# Patient Record
Sex: Female | Born: 1960 | Race: White | Hispanic: No | Marital: Married | Smoking: Never smoker
Health system: Southern US, Community
[De-identification: ages and names within clinical notes are randomized; demographics above are authoritative.]

## PROBLEM LIST (undated history)

## (undated) DIAGNOSIS — Z9889 Other specified postprocedural states: Secondary | ICD-10-CM

## (undated) DIAGNOSIS — I868 Varicose veins of other specified sites: Secondary | ICD-10-CM

## (undated) DIAGNOSIS — R112 Nausea with vomiting, unspecified: Secondary | ICD-10-CM

## (undated) DIAGNOSIS — R51 Headache: Secondary | ICD-10-CM

## (undated) DIAGNOSIS — K589 Irritable bowel syndrome without diarrhea: Secondary | ICD-10-CM

## (undated) DIAGNOSIS — I10 Essential (primary) hypertension: Secondary | ICD-10-CM

## (undated) DIAGNOSIS — S42309A Unspecified fracture of shaft of humerus, unspecified arm, initial encounter for closed fracture: Secondary | ICD-10-CM

## (undated) DIAGNOSIS — Z0282 Encounter for adoption services: Secondary | ICD-10-CM

## (undated) DIAGNOSIS — Z789 Other specified health status: Secondary | ICD-10-CM

## (undated) DIAGNOSIS — G35 Multiple sclerosis: Secondary | ICD-10-CM

## (undated) HISTORY — DX: Headache: R51

## (undated) HISTORY — PX: BREAST MASS EXCISION: SHX1267

## (undated) HISTORY — DX: Encounter for adoption services: Z02.82

## (undated) HISTORY — DX: Irritable bowel syndrome, unspecified: K58.9

## (undated) HISTORY — PX: ABDOMINAL HYSTERECTOMY: SHX81

## (undated) HISTORY — DX: Other specified health status: Z78.9

## (undated) HISTORY — DX: Varicose veins of other specified sites: I86.8

---

## 2003-01-24 ENCOUNTER — Other Ambulatory Visit: Admission: RE | Admit: 2003-01-24 | Discharge: 2003-01-24 | Payer: Self-pay | Admitting: Obstetrics and Gynecology

## 2003-07-30 ENCOUNTER — Encounter: Admission: RE | Admit: 2003-07-30 | Discharge: 2003-07-30 | Payer: Self-pay | Admitting: Obstetrics and Gynecology

## 2003-08-07 ENCOUNTER — Encounter: Admission: RE | Admit: 2003-08-07 | Discharge: 2003-08-07 | Payer: Self-pay | Admitting: Obstetrics and Gynecology

## 2003-08-07 ENCOUNTER — Encounter (INDEPENDENT_AMBULATORY_CARE_PROVIDER_SITE_OTHER): Payer: Self-pay | Admitting: Specialist

## 2004-03-09 ENCOUNTER — Other Ambulatory Visit: Admission: RE | Admit: 2004-03-09 | Discharge: 2004-03-09 | Payer: Self-pay | Admitting: Obstetrics and Gynecology

## 2004-05-13 ENCOUNTER — Encounter: Admission: RE | Admit: 2004-05-13 | Discharge: 2004-05-13 | Payer: Self-pay | Admitting: Obstetrics and Gynecology

## 2004-07-28 ENCOUNTER — Ambulatory Visit (HOSPITAL_COMMUNITY): Admission: RE | Admit: 2004-07-28 | Discharge: 2004-07-28 | Payer: Self-pay | Admitting: General Surgery

## 2004-07-28 ENCOUNTER — Ambulatory Visit (HOSPITAL_BASED_OUTPATIENT_CLINIC_OR_DEPARTMENT_OTHER): Admission: RE | Admit: 2004-07-28 | Discharge: 2004-07-28 | Payer: Self-pay | Admitting: General Surgery

## 2004-07-28 ENCOUNTER — Encounter (INDEPENDENT_AMBULATORY_CARE_PROVIDER_SITE_OTHER): Payer: Self-pay | Admitting: *Deleted

## 2005-12-07 ENCOUNTER — Other Ambulatory Visit: Admission: RE | Admit: 2005-12-07 | Discharge: 2005-12-07 | Payer: Self-pay | Admitting: Obstetrics and Gynecology

## 2008-02-23 ENCOUNTER — Emergency Department (HOSPITAL_COMMUNITY): Admission: EM | Admit: 2008-02-23 | Discharge: 2008-02-23 | Payer: Self-pay | Admitting: Family Medicine

## 2010-08-02 ENCOUNTER — Emergency Department (HOSPITAL_COMMUNITY): Admission: EM | Admit: 2010-08-02 | Discharge: 2010-08-02 | Payer: Self-pay | Admitting: Family Medicine

## 2010-11-03 ENCOUNTER — Encounter (INDEPENDENT_AMBULATORY_CARE_PROVIDER_SITE_OTHER): Payer: BC Managed Care – PPO | Admitting: Vascular Surgery

## 2010-11-03 DIAGNOSIS — R221 Localized swelling, mass and lump, neck: Secondary | ICD-10-CM

## 2010-11-04 NOTE — Consult Note (Signed)
NEW PATIENT CONSULTATION  Cynthia Nichols, Cynthia Nichols DOB:  Nov 25, 1960                                       11/03/2010 [id number]#:[id number]  Patient presents today for evaluation of right neck mass.  She is a very pleasant, healthy 50 year old white female who reported that since at least her 20's, she had prominent fullness in the supraclavicular area and her right neck.  Over the past month, this has become firm.  She has had various imaging studies to include a CT angiogram at [facility] on 10/16/2010.  She had an old MRI of her brain from [facility] from a year ago for comparison.  She does have a history of MS.  She does have a history of asthma.  PRIOR HISTORY:  Hysterectomy, bilateral breast nodules removed, and wisdom tooth extraction.  SOCIAL HISTORY:  She is married with 3 children.  She works as an Char[employer] fundraiser. She does not smoke or drink alcohol.  FAMILY HISTORY:  Unknown.  She was adopted at birth.  REVIEW OF SYSTEMS:  No weight loss or gain.  She weighs 130 pounds.  She is 5 feet 7 inches tall. CARDIAC:  Positive for shortness of breath, exertion, and chest tightness. GI:  Diarrhea and constipation. NEUROLOGIC:  Headaches. PULMONARY:  Asthma. URINARY:  Frequent urination.  Otherwise, review of systems is negative.  PHYSICAL EXAMINATION:  A well-developed and well-nourished white female appearing stated age in no acute stress.  Blood pressure 130/85, pulse 83, respirations 18.  HEENT:  Does show a very firm 3 cm mass at the base of her right neck above her clavicle.  She also has an easily palpable cord in her external jugular vein, and this is clinically thrombosed.  She does not have any similar findings in her left neck. Her radial pulses are 2+.  Chest is clear bilaterally.  Heart is regular rate and rhythm.  I do not hear any carotid bruits.  Abdomen is soft and nontender.  Her neurologic shows no focal weakness or paresthesias. Skin without ulcers or  rashes.  I did review her CT from [facility].  This clearly shows that this is a thrombosis of a large venous structure.  She does have flow at that time into her external jugular vein extending into this.  She clinically has subsequently occluded her external jugular vein.  I had a long discussion with patient.  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  This has thrombosed, and she is now clinically feeling this as the distention in this area.  I explained this should have essentially 0 risk for emboli and should resolve spontaneously.  I explained that as her thrombus is absorbed, that she may resolve this venous aneurysm or she may reconstitute flow into this.  I explained that she may have persistent fullness in this area, and if so, would recommend a resection of this area.  I would not recommend anything until she has had probable spontaneous resolution of this.  She understands and will notify us should she have any clinical changes, otherwise we will see her again in 1 month for continued follow-up.    Larina Earthly, M.D.  TFE/MEDQ  D:  11/03/2010  T:  11/04/2010  Job:  [id number]  cc:   Juluis Mire, M.D.  Laren Everts, [facility]

## 2010-12-01 LAB — POCT I-STAT, CHEM 8
BUN: 14 mg/dL (ref 6–23)
Calcium, Ion: 1.24 mmol/L (ref 1.12–1.32)
Chloride: 109 mEq/L (ref 96–112)
Creatinine, Ser: 0.9 mg/dL (ref 0.4–1.2)
Glucose, Bld: 97 mg/dL (ref 70–99)
HCT: 43 % (ref 36.0–46.0)
Hemoglobin: 14.6 g/dL (ref 12.0–15.0)
Potassium: 4.1 mEq/L (ref 3.5–5.1)
Sodium: 142 mEq/L (ref 135–145)
TCO2: 25 mmol/L (ref 0–100)

## 2011-02-02 ENCOUNTER — Ambulatory Visit: Payer: Self-pay | Admitting: Vascular Surgery

## 2011-02-05 NOTE — Op Note (Signed)
NAMEJAMYLAH, MARINACCIO NO.:  [id number]   MEDICAL RECORD NO.:  [id number]          PATIENT TYPE:  AMB   LOCATION:  DSC                          FACILITY:  [facility]   PHYSICIAN:  Rose Phi. Maple Hudson, M.D.   DATE OF BIRTH:  August 27, 1961   DATE OF PROCEDURE:  07/28/2004  DATE OF DISCHARGE:                                 OPERATIVE REPORT   PREOPERATIVE DIAGNOSIS:  Fibroadenoma of the right breast.   POSTOPERATIVE DIAGNOSIS:  Fibroadenoma of the right breast.   OPERATION:  Excision of fibroadenoma of the right breast.   SURGEON:  Rose Phi. Maple Hudson, M.D.   ANESTHESIA:  MAC.   OPERATIVE PROCEDURE:  The patient is placed on the operating table with the  arms extended on the arm board.  The right breast is prepped and draped in  the usual fashion.   The palpable nodule was at the 9 o'clock position close to the areolar  margin, and it was outlined with a marking pencil.  A curvilinear incision  was then outlined and the area infiltrated with the local anesthetic  mixture.  Incision was made and the fibroadenoma exposed.  A suture was  placed in it for traction purposes, and it was elevated and then excised.  Grossly it appeared to be a fibroadenoma.   Hemostasis obtained with electrocautery.   A subcuticular closure of 4-0 Monocryl and Steri-Strips carried out.  Dressing applied.  The patient transferred to the recovery room in  satisfactory condition, having tolerated the procedure well.      Pete   PRY/MEDQ  D:  07/28/2004  T:  07/28/2004  Job:  [id number]

## 2011-02-23 ENCOUNTER — Ambulatory Visit: Payer: Self-pay | Admitting: Vascular Surgery

## 2011-03-23 ENCOUNTER — Ambulatory Visit (INDEPENDENT_AMBULATORY_CARE_PROVIDER_SITE_OTHER): Payer: BC Managed Care – PPO | Admitting: Vascular Surgery

## 2011-03-23 DIAGNOSIS — I868 Varicose veins of other specified sites: Secondary | ICD-10-CM

## 2011-03-23 NOTE — Assessment & Plan Note (Signed)
OFFICE VISIT  Cynthia Nichols, Cynthia Nichols DOB:  [birth year]                                       03/23/2011 [id number]#:[id number]  The patient presents today for followup of right neck venous aneurysm. I had seen her 3 months ago at which time she had apparently thrombosed a longstanding venous aneurysm at the base of her right neck.  At that time I recommended conservative treatment and explained the probable resolution of this.  Fortunately she has had no further difficulty.  She does have a softening of the area of thrombosed venous aneurysm.  It appears clinically as though she does have flow in her external jugular currently with resolution of the thrombus in this as well.  She does report some point tenderness over her clavicle and I explained that this may or may not be related to the venous thrombus but certainly is in the same area.  She does report that she is having some swelling in her lower extremities but certainly does not have any currently and does not have any evidence of venous varicosities or other venous pathology.  She does wear compression garments and this does help.  I suspect this is related to fluid overload rather than a venous pathology and certainly no correctable venous pathology.  I have recommended continued observation only.  She was reassured with the discussion and will see [place] again on an as-needed basis.    Larina Earthly, M.D. Electronically Signed  TFE/MEDQ  D:  03/23/2011  T:  03/23/2011  Job:  [id number]  cc:   Laren Everts, DO Juluis Mire, M.D.

## 2011-06-17 LAB — POCT RAPID STREP A: Streptococcus, Group A Screen (Direct): NEGATIVE

## 2011-10-22 ENCOUNTER — Encounter: Payer: Self-pay | Admitting: Vascular Surgery

## 2011-10-25 ENCOUNTER — Encounter: Payer: Self-pay | Admitting: Vascular Surgery

## 2011-10-26 ENCOUNTER — Encounter: Payer: Self-pay | Admitting: Vascular Surgery

## 2011-10-26 ENCOUNTER — Ambulatory Visit (INDEPENDENT_AMBULATORY_CARE_PROVIDER_SITE_OTHER): Payer: BC Managed Care – PPO | Admitting: Vascular Surgery

## 2011-10-26 VITALS — BP 138/83 | HR 98 | Resp 18 | Ht 67.0 in | Wt 132.0 lb

## 2011-10-26 DIAGNOSIS — I720 Aneurysm of carotid artery: Secondary | ICD-10-CM

## 2011-10-26 DIAGNOSIS — M79606 Pain in leg, unspecified: Secondary | ICD-10-CM

## 2011-10-26 DIAGNOSIS — M79609 Pain in unspecified limb: Secondary | ICD-10-CM

## 2011-10-26 HISTORY — DX: Aneurysm of carotid artery: I72.0

## 2011-10-26 NOTE — Progress Notes (Signed)
The patient presents today for evaluation of lower surety discomfort. She has pain and burning in both feet and does have some swelling from her knees distally with prolonged standing. Reports this has been progressive despite the use of Neurontin and pain medication. She also has a discoloration of both feet.  It seen the patient in the past for evaluation of venous aneurysm in her right supraclavicular area. She did unchanged thrombosis and is minimal recurrence and size of this.  Past Medical History  Diagnosis Date  . IBS (irritable bowel syndrome)   . Asthma   . Adopted     Patient has no known family history  . Headache   . Venous aneurysm     Right side of neck    History  Substance Use Topics  . Smoking status: Never Smoker   . Smokeless tobacco: Never Used  . Alcohol Use: Yes    No family history on file.  No Known Allergies  Current outpatient prescriptions:ALPRAZolam (XANAX) 0.25 MG tablet, Take 0.25 mg by mouth at bedtime as needed., Disp: , Rfl: ;  baclofen (LIORESAL) 10 MG tablet, Take 10 mg by mouth as needed., Disp: , Rfl: ;  Benzonatate 150 MG CAPS, Take by mouth. ZONEGRAN, Disp: , Rfl: ;  budesonide-formoterol (SYMBICORT) 160-4.5 MCG/ACT inhaler, Inhale 2 puffs into the lungs 2 (two) times daily., Disp: , Rfl:  butalbital-acetaminophen-caffeine (FIORICET, ESGIC) 50-325-40 MG per tablet, Take 1 tablet by mouth 2 (two) times daily as needed., Disp: , Rfl: ;  cholecalciferol (VITAMIN D) 1000 UNITS tablet, Take 2,000 Units by mouth daily., Disp: , Rfl: ;  Cyanocobalamin (VITAMIN B 12 PO), Take by mouth., Disp: , Rfl: ;  gabapentin (NEURONTIN) 300 MG capsule, Take 300 mg by mouth. PATIENT TAKING 900MG  AT BEDTIME, Disp: , Rfl:  levocetirizine (XYZAL) 5 MG tablet, Take 5 mg by mouth as needed. , Disp: , Rfl: ;  omeprazole (PRILOSEC OTC) 20 MG tablet, Take 20 mg by mouth daily., Disp: , Rfl: ;  traMADol (ULTRAM) 50 MG tablet, Take 50 mg by mouth every 6 (six) hours as needed.,  Disp: , Rfl:   BP 138/83  Pulse 98  Resp 18  Ht 5\' 7"  (1.702 m)  Wt 132 lb (59.875 kg)  BMI 20.67 kg/m2  Body mass index is 20.67 kg/(m^2).       Review of systems: Suffer a chest pressure and shortness of breath with lying flat and asthma  Neurologic weakness in arms and legs and dizziness  Review of systems otherwise negative  Physical exam: Well-developed well-nourished white female no acute distress she has 2+ radial and dorsalis pedis pulses bilaterally abdomen is soft nontender no masses noted. She does have diffuse petechiae over both feet and extending in a more scattered area above her ankles up onto her distal cath. She does have some very mild swelling in her lower extremity. She does not have any evidence of venous varicosities.  Impression and plan: Unusual pattern with burning and stinging pain in her feet and skin changes of petechiae and hemosiderin deposit in her distal ankles onto her feet. He does have some swelling and I recommended a venous reflux study to rule out significant reflux. This does not appear to be the case only on the physical exam. We will obtain a duplex at her convenience and discuss this further at that time I explained that with normal arterial flow I do not think this is a significant vascular problem

## 2011-11-08 ENCOUNTER — Encounter: Payer: Self-pay | Admitting: Vascular Surgery

## 2011-11-09 ENCOUNTER — Encounter: Payer: Self-pay | Admitting: Vascular Surgery

## 2011-11-09 ENCOUNTER — Ambulatory Visit (INDEPENDENT_AMBULATORY_CARE_PROVIDER_SITE_OTHER): Payer: BC Managed Care – PPO | Admitting: Vascular Surgery

## 2011-11-09 ENCOUNTER — Encounter (INDEPENDENT_AMBULATORY_CARE_PROVIDER_SITE_OTHER): Payer: BC Managed Care – PPO | Admitting: *Deleted

## 2011-11-09 VITALS — BP 120/87 | HR 104 | Resp 18 | Ht 67.0 in | Wt 136.0 lb

## 2011-11-09 DIAGNOSIS — M7989 Other specified soft tissue disorders: Secondary | ICD-10-CM

## 2011-11-09 DIAGNOSIS — I872 Venous insufficiency (chronic) (peripheral): Secondary | ICD-10-CM | POA: Insufficient documentation

## 2011-11-09 DIAGNOSIS — M79609 Pain in unspecified limb: Secondary | ICD-10-CM

## 2011-11-09 HISTORY — DX: Venous insufficiency (chronic) (peripheral): I87.2

## 2011-11-09 NOTE — Progress Notes (Signed)
The patient is here today for venous duplex for followup in the burning and stinging and swelling discomfort in her lower extremities. She also had bilateral petechia. She did not appear to have venous reflux on the basis of physical exam when I seen her in [place] February. I had recommended duplex to rule out any significant deep venous reflux.  Past Medical History  Diagnosis Date  . IBS (irritable bowel syndrome)   . Asthma   . Adopted     Patient has no known family history  . Headache   . Venous aneurysm     Right side of neck    History  Substance Use Topics  . Smoking status: Never Smoker   . Smokeless tobacco: Never Used  . Alcohol Use: Yes    History reviewed. No pertinent family history.  No Known Allergies  Current outpatient prescriptions:ALPRAZolam (XANAX) 0.25 MG tablet, Take 0.25 mg by mouth at bedtime as needed., Disp: , Rfl: ;  baclofen (LIORESAL) 10 MG tablet, Take 10 mg by mouth as needed., Disp: , Rfl: ;  Benzonatate 150 MG CAPS, Take by mouth. ZONEGRAN, Disp: , Rfl: ;  budesonide-formoterol (SYMBICORT) 160-4.5 MCG/ACT inhaler, Inhale 2 puffs into the lungs 2 (two) times daily., Disp: , Rfl:  butalbital-acetaminophen-caffeine (FIORICET, ESGIC) 50-325-40 MG per tablet, Take 1 tablet by mouth 2 (two) times daily as needed., Disp: , Rfl: ;  cholecalciferol (VITAMIN D) 1000 UNITS tablet, Take 15,000 Units by mouth daily. , Disp: , Rfl: ;  Cyanocobalamin (VITAMIN B 12 PO), Take by mouth., Disp: , Rfl: ;  gabapentin (NEURONTIN) 300 MG capsule, Take 300 mg by mouth. PATIENT TAKING 900MG  AT BEDTIME, Disp: , Rfl:  levocetirizine (XYZAL) 5 MG tablet, Take 5 mg by mouth as needed. , Disp: , Rfl: ;  omeprazole (PRILOSEC OTC) 20 MG tablet, Take 20 mg by mouth daily., Disp: , Rfl: ;  traMADol (ULTRAM) 50 MG tablet, Take 50 mg by mouth every 6 (six) hours as needed., Disp: , Rfl:   BP 120/87  Pulse 104  Resp 18  Ht 5\' 7"  (1.702 m)  Wt 136 lb (61.689 kg)  BMI 21.30 kg/m2  SpO2  100%  Body mass index is 21.30 kg/(m^2).       Physical exam well-developed well-nourished white female no acute distress. The area of her prior right neck venous aneurysm is stable with no evidence of recurrence. She has no change in her lower extremity is with no significant swelling in a scattered petechia from her mid calf distally.  Venous duplex. She is no evidence of reflux in her greater small saphenous veins bilaterally and no evidence of enlarged saphenous saphenous veins. He does have mild reflux in her right superficial femoral vein.  Impression and plan no evidence of significant venous hypertension and no evidence of arterial insufficiency in her lower Shoney's bilaterally. I discussed this at length with the patient. She will continue with neurologic evaluation to determine the cause of her discomfort. She will see [place] on an as-needed basis

## 2011-11-11 NOTE — Procedures (Unsigned)
LOWER EXTREMITY VENOUS REFLUX EXAM  INDICATION:  Bilateral lower extremity pain.  EXAM:  Using color-flow imaging and pulse Doppler spectral analysis, the bilateral common femoral, superficial femoral, popliteal, posterior tibial, greater and lesser saphenous veins are evaluated.  There is evidence suggesting focal deep venous insufficiency in the right superficial femoral vein.  The bilateral saphenofemoral junctions are competent. The bilateral GSV is competent.  The bilateral proximal small saphenous vein is too small to evaluate for reflux.  GSV Diameter (used if found to be incompetent only)                                           Right    Left Proximal Greater Saphenous Vein           cm       cm Proximal-to-mid-thigh                     cm       cm Mid thigh                                 cm       cm Mid-distal thigh                          cm       cm Distal thigh                              cm       cm Knee                                      cm       cm  IMPRESSION: 1. Bilateral greater saphenous veins are competent. 2. There is focal reflux observed in the right superficial femoral     vein. 3. No evidence of deep venous thrombosis is observed.  ___________________________________________ Larina Earthly, M.D.  LT/MEDQ  D:  11/09/2011  T:  11/09/2011  Job:  [id number]

## 2011-12-14 DIAGNOSIS — Z9109 Other allergy status, other than to drugs and biological substances: Secondary | ICD-10-CM | POA: Insufficient documentation

## 2011-12-14 DIAGNOSIS — G35 Multiple sclerosis: Secondary | ICD-10-CM | POA: Insufficient documentation

## 2011-12-14 DIAGNOSIS — G44219 Episodic tension-type headache, not intractable: Secondary | ICD-10-CM | POA: Insufficient documentation

## 2011-12-14 HISTORY — DX: Multiple sclerosis: G35

## 2012-06-15 ENCOUNTER — Encounter: Payer: Self-pay | Admitting: Cardiovascular Disease

## 2012-06-16 ENCOUNTER — Encounter: Payer: Self-pay | Admitting: Cardiovascular Disease

## 2012-06-21 DIAGNOSIS — K589 Irritable bowel syndrome without diarrhea: Secondary | ICD-10-CM | POA: Insufficient documentation

## 2012-06-21 DIAGNOSIS — I868 Varicose veins of other specified sites: Secondary | ICD-10-CM | POA: Insufficient documentation

## 2012-06-23 ENCOUNTER — Encounter: Payer: Self-pay | Admitting: Cardiovascular Disease

## 2012-06-23 ENCOUNTER — Ambulatory Visit (INDEPENDENT_AMBULATORY_CARE_PROVIDER_SITE_OTHER): Payer: BC Managed Care – PPO | Admitting: Cardiovascular Disease

## 2012-06-23 VITALS — BP 136/88 | HR 88 | Ht 67.0 in | Wt 137.0 lb

## 2012-06-23 DIAGNOSIS — G35 Multiple sclerosis: Secondary | ICD-10-CM

## 2012-06-23 DIAGNOSIS — M79606 Pain in leg, unspecified: Secondary | ICD-10-CM

## 2012-06-23 DIAGNOSIS — R0602 Shortness of breath: Secondary | ICD-10-CM

## 2012-06-23 DIAGNOSIS — E782 Mixed hyperlipidemia: Secondary | ICD-10-CM

## 2012-06-23 DIAGNOSIS — G35D Multiple sclerosis, unspecified: Secondary | ICD-10-CM

## 2012-06-23 DIAGNOSIS — R002 Palpitations: Secondary | ICD-10-CM

## 2012-06-23 DIAGNOSIS — M79609 Pain in unspecified limb: Secondary | ICD-10-CM

## 2012-06-23 MED ORDER — PROPRANOLOL HCL 10 MG PO TABS: 10.0000 mg | ORAL_TABLET | ORAL | Status: DC | PRN

## 2012-06-23 NOTE — Patient Instructions (Addendum)
Your physician has requested that you have a stress echocardiogram. For further information please visit [email or web address]. Please follow instruction sheet as given. Your physician recommends that you continue on your current medications as directed. Please refer to the Current Medication list given to you today.    

## 2012-06-23 NOTE — Progress Notes (Signed)
Patient ID: Cynthia Nichols, female   DOB: 1961-04-09, 51 y.o.   MRN: [id number] 51 yo referred by Dr Cynthia Nichols for palpitations and dyspnea  Palpitations worse last few weeks.  More at night and not necessarily with exercise  Can be bad enough to cause dyspnea.  No documented SVT or PAF on monitor Denies acute stress or anxiety but work at [employer] always challenging  No chest pain.  Non smoker with no lung disease No LE edema. Had leg pain in 2/13 and had some venous insuf. On left with no DVT   Thyroid borderline low and mild elevation in cholesterol  On diet Rx  Has MS and has had [place] injections of copazone daily.  She has had this for two years though  MS stable with MRI/MRA coming up  Reviewed monitor 9/27 SR with PAC;s Average HR 75 minimum 55  Maximum 122  PAC;s less than 1% of beats  No ectopies longer than 3 beats   ROS: Denies fever, malais, weight loss, blurry vision, decreased visual acuity, cough, sputum, SOB, hemoptysis, pleuritic pain, palpitaitons, heartburn, abdominal pain, melena, lower extremity edema, claudication, or rash.  All other systems reviewed and negative   General: Affect appropriate Healthy:  appears stated age HEENT: normal Neck supple with no adenopathy JVP normal no bruits no thyromegaly Lungs clear with no wheezing and good diaphragmatic motion Heart:  S1/S2 no murmur,rub, gallop or click PMI normal Abdomen: benighn, BS positve, no tenderness, no AAA no bruit.  No HSM or HJR Distal pulses intact with no bruits No edema Neuro non-focal Skin warm and dry No muscular weakness  Medications Current Outpatient Prescriptions  Medication Sig Dispense Refill  . ALPRAZolam (XANAX) 0.25 MG tablet Take 0.25 mg by mouth at bedtime as needed.      . baclofen (LIORESAL) 10 MG tablet Take 10 mg by mouth as needed.      . budesonide-formoterol (SYMBICORT) 160-4.5 MCG/ACT inhaler Inhale 2 puffs into the lungs 2 (two) times daily.      . butalbital-acetaminophen-caffeine  (FIORICET, ESGIC) 50-325-40 MG per tablet Take 1 tablet by mouth 2 (two) times daily as needed.      . cholecalciferol (VITAMIN D) 1000 UNITS tablet Take 15,000 Units by mouth daily.       . Cyanocobalamin (VITAMIN B 12 PO) Take by mouth.      . gabapentin (NEURONTIN) 300 MG capsule Take 600 mg by mouth. PATIENT TAKING 900MG  AT BEDTIME      . levocetirizine (XYZAL) 5 MG tablet Take 5 mg by mouth as needed.       . pantoprazole (PROTONIX) 40 MG tablet       . traMADol (ULTRAM) 50 MG tablet Take 50 mg by mouth every 6 (six) hours as needed.      Marland Kitchen PREMARIN vaginal cream         Allergies Imitrex  Family History: No family history on file.  Social History: History   Social History  . Marital Status: Married    Spouse Name: N/A    Number of Children: N/A  . Years of Education: N/A   Occupational History  . Not on file.   Social History Main Topics  . Smoking status: Never Smoker   . Smokeless tobacco: Never Used  . Alcohol Use: Yes  . Drug Use: No  . Sexually Active: Not on file   Other Topics Concern  . Not on file   Social History Narrative  . No narrative on file  Electrocardiogram:  NSR rate 78  Normal ECG  Assessment and Plan

## 2012-06-23 NOTE — Assessment & Plan Note (Signed)
Cholesterol is at goal.  Continue current dose of statin and diet Rx.  No myalgias or side effects.  F/U  LFT's in 6 months. No results found for this basename: LDLCALC   LDL 120 on labs 9/26  Diet Rx

## 2012-06-23 NOTE — Assessment & Plan Note (Signed)
F/U MRI/MRA with neuro Continue capazone.  Should not be related to palpitations

## 2012-06-23 NOTE — Assessment & Plan Note (Signed)
Bening sounding  Monitor just PAC;s  PRN inderal  Stress echo to R/O structural heart disease and make sure no PAF/PSVT with stress

## 2012-06-23 NOTE — Assessment & Plan Note (Signed)
Mild venous insuf.  Support hose as needed especially long shifts at work

## 2012-07-04 ENCOUNTER — Ambulatory Visit (HOSPITAL_COMMUNITY): Payer: BC Managed Care – PPO | Attending: Cardiovascular Disease | Admitting: Radiology

## 2012-07-04 ENCOUNTER — Ambulatory Visit (HOSPITAL_COMMUNITY): Payer: BC Managed Care – PPO | Attending: Cardiovascular Disease

## 2012-07-04 ENCOUNTER — Encounter: Payer: Self-pay | Admitting: Cardiovascular Disease

## 2012-07-04 DIAGNOSIS — R002 Palpitations: Secondary | ICD-10-CM | POA: Insufficient documentation

## 2012-07-04 DIAGNOSIS — G35 Multiple sclerosis: Secondary | ICD-10-CM | POA: Insufficient documentation

## 2012-07-04 DIAGNOSIS — R0602 Shortness of breath: Secondary | ICD-10-CM

## 2012-07-04 DIAGNOSIS — R0989 Other specified symptoms and signs involving the circulatory and respiratory systems: Secondary | ICD-10-CM

## 2012-07-04 DIAGNOSIS — J45909 Unspecified asthma, uncomplicated: Secondary | ICD-10-CM | POA: Insufficient documentation

## 2012-07-04 DIAGNOSIS — R0609 Other forms of dyspnea: Secondary | ICD-10-CM | POA: Insufficient documentation

## 2012-07-04 NOTE — Progress Notes (Signed)
Stress Echocardiogram performed.  

## 2012-08-07 ENCOUNTER — Encounter: Payer: Self-pay | Admitting: Cardiovascular Disease

## 2012-08-07 NOTE — Progress Notes (Signed)
Patient ID: Cynthia Nichols, female   DOB: Mar 02, 1961, 51 y.o.   MRN: [id number] 51 yo referred by Dr Arelia Sneddon for palpitations and dyspnea Palpitations worse last few weeks. More at night and not necessarily with exercise Can be bad enough to cause dyspnea. No documented SVT or PAF on monitor Denies acute stress or anxiety but work at [employer] always challenging No chest pain. Non smoker with no lung disease No LE edema. Had leg pain in 2/13 and had some venous insuf. On left with no DVT Thyroid borderline low and mild elevation in cholesterol On diet Rx Has MS and has had [place] injections of copazone daily. She has had this for two years though MS stable with MRI/MRA coming up  Reviewed monitor 9/27 SR with PAC;s Average HR 75 minimum 55 Maximum 122 PAC;s less than 1% of beats No ectopies longer than 3 beats  ROS: Denies fever, malais, weight loss, blurry vision, decreased visual acuity, cough, sputum, SOB, hemoptysis, pleuritic pain, palpitaitons, heartburn, abdominal pain, melena, lower extremity edema, claudication, or rash.  All other systems reviewed and negative  General: Affect appropriate Healthy:  appears stated age HEENT: normal Neck supple with no adenopathy JVP normal no bruits no thyromegaly Lungs clear with no wheezing and good diaphragmatic motion Heart:  S1/S2 no murmur, no rub, gallop or click PMI normal Abdomen: benighn, BS positve, no tenderness, no AAA no bruit.  No HSM or HJR Distal pulses intact with no bruits No edema Neuro non-focal Skin warm and dry No muscular weakness   Current Outpatient Prescriptions  Medication Sig Dispense Refill  . ALPRAZolam (XANAX) 0.25 MG tablet Take 0.25 mg by mouth at bedtime as needed.      . baclofen (LIORESAL) 10 MG tablet Take 10 mg by mouth as needed.      . budesonide-formoterol (SYMBICORT) 160-4.5 MCG/ACT inhaler Inhale 2 puffs into the lungs 2 (two) times daily.      . butalbital-acetaminophen-caffeine (FIORICET, ESGIC) 50-325-40 MG  per tablet Take 1 tablet by mouth 2 (two) times daily as needed.      . cholecalciferol (VITAMIN D) 1000 UNITS tablet Take 15,000 Units by mouth daily.       . Cyanocobalamin (VITAMIN B 12 PO) Take by mouth.      . gabapentin (NEURONTIN) 300 MG capsule Take 600 mg by mouth. PATIENT TAKING 900MG  AT BEDTIME      . levocetirizine (XYZAL) 5 MG tablet Take 5 mg by mouth as needed.       . pantoprazole (PROTONIX) 40 MG tablet       . PREMARIN vaginal cream       . propranolol (INDERAL) 10 MG tablet Take 1 tablet (10 mg total) by mouth as needed.  30 tablet  3  . traMADol (ULTRAM) 50 MG tablet Take 50 mg by mouth every 6 (six) hours as needed.        Allergies  Imitrex  Electrocardiogram:  06/23/12  SR rate 78 normal ECG  Assessment and Plan

## 2012-08-28 ENCOUNTER — Encounter: Payer: Self-pay | Admitting: Cardiovascular Disease

## 2012-08-28 ENCOUNTER — Ambulatory Visit (INDEPENDENT_AMBULATORY_CARE_PROVIDER_SITE_OTHER): Payer: BC Managed Care – PPO | Admitting: Cardiovascular Disease

## 2012-08-28 VITALS — BP 160/96 | HR 68 | Wt 140.0 lb

## 2012-08-28 DIAGNOSIS — E782 Mixed hyperlipidemia: Secondary | ICD-10-CM

## 2012-08-28 DIAGNOSIS — G35 Multiple sclerosis: Secondary | ICD-10-CM

## 2012-08-28 DIAGNOSIS — R002 Palpitations: Secondary | ICD-10-CM

## 2012-08-28 NOTE — Assessment & Plan Note (Signed)
Improved No evidence of significant arrhyythmia on monitor and stress echo normal

## 2012-08-28 NOTE — Patient Instructions (Signed)
Your physician wants you to follow-up in: YEAR WITH DR NISHAN  You will receive a reminder letter in the mail two months in advance. If you don't receive a letter, please call our office to schedule the follow-up appointment.  Your physician recommends that you continue on your current medications as directed. Please refer to the Current Medication list given to you today. 

## 2012-08-28 NOTE — Assessment & Plan Note (Signed)
F/U neuro MRI to be done this week. Some fatigue but stable

## 2012-08-28 NOTE — Progress Notes (Signed)
Patient ID: Cynthia Nichols, female   DOB: [birth year], 51 y.o.   MRN: [id number] 51 yo referred by Dr Arelia Sneddon for palpitations and dyspnea Palpitations worse last few weeks. More at night and not necessarily with exercise Can be bad enough to cause dyspnea. No documented SVT or PAF on monitor Denies acute stress or anxiety but work at [employer] always challenging No chest pain. Non smoker with no lung disease No LE edema. Had leg pain in 2/13 and had some venous insuf. On left with no DVT Thyroid borderline low and mild elevation in cholesterol On diet Rx Has MS and has had Napoleon injections of copazone daily. She has had this for two years though MS stable with MRI/MRA coming up   Reviewed monitor 9/27 SR with PAC;s Average HR 75 minimum 55 Maximum 122 PAC;s less than 1% of beats No ectopies longer than 3 beats Stress echo 10/15 reviewed normal but max HR only 142 82% PMHR  ROS: Denies fever, malais, weight loss, blurry vision, decreased visual acuity, cough, sputum, SOB, hemoptysis, pleuritic pain, palpitaitons, heartburn, abdominal pain, melena, lower extremity edema, claudication, or rash.  All other systems reviewed and negative  General: Affect appropriate Healthy:  appears stated age HEENT: normal Neck supple with no adenopathy JVP normal no bruits no thyromegaly Lungs clear with no wheezing and good diaphragmatic motion Heart:  S1/S2 no murmur, no rub, gallop or click PMI normal Abdomen: benighn, BS positve, no tenderness, no AAA no bruit.  No HSM or HJR Distal pulses intact with no bruits No edema Neuro non-focal Skin warm and dry No muscular weakness   Current Outpatient Prescriptions  Medication Sig Dispense Refill  . ALPRAZolam (XANAX) 0.25 MG tablet Take 0.25 mg by mouth at bedtime as needed.      . baclofen (LIORESAL) 20 MG tablet Take 20 mg by mouth daily.      . budesonide-formoterol (SYMBICORT) 160-4.5 MCG/ACT inhaler Inhale 2 puffs into the lungs 2 (two) times daily.      .  butalbital-acetaminophen-caffeine (FIORICET, ESGIC) 50-325-40 MG per tablet Take 1 tablet by mouth 2 (two) times daily as needed.      . cholecalciferol (VITAMIN D) 1000 UNITS tablet Take 15,000 Units by mouth daily.       . Cyanocobalamin (VITAMIN B 12 PO) Take by mouth.      . gabapentin (NEURONTIN) 300 MG capsule Take 600 mg by mouth. PATIENT TAKING 900MG  AT BEDTIME      . levocetirizine (XYZAL) 5 MG tablet Take 5 mg by mouth as needed.       . traMADol (ULTRAM) 50 MG tablet Take 50 mg by mouth every 6 (six) hours as needed.        Allergies  Imitrex  Electrocardiogram:  Assessment and Plan

## 2012-08-28 NOTE — Assessment & Plan Note (Signed)
Cholesterol is at goal.  Continue current dose of statin and diet Rx.  No myalgias or side effects.  F/U  LFT's in 6 months. No results found for this basename: LDLCALC             

## 2014-11-11 ENCOUNTER — Telehealth: Payer: Self-pay | Admitting: *Deleted

## 2014-11-11 NOTE — Telephone Encounter (Signed)
-----   Message from Gena Fray sent at 11/11/2014 11:58 AM EST ----- Regarding: Triage Please call Patty @ [phone]. She has c/o bilateral feet pain with burning. R>L, former pt here.  Thanks! Hinton Dyer

## 2014-11-11 NOTE — Telephone Encounter (Signed)
Patient has MS and is a patient of Dr. Alease Frame at [facility]. She has been seen there recently and "they really were no help".  Has had long history of burning sensation in her legs. She thinks her venous issues have gotten worse since Dr. Donnetta Hutching saw her in 2013. We will make her an appt for reflux scans and to see Dr. Donnetta Hutching when possible.

## 2014-11-20 ENCOUNTER — Other Ambulatory Visit: Payer: Self-pay | Admitting: *Deleted

## 2014-11-20 DIAGNOSIS — Z8739 Personal history of other diseases of the musculoskeletal system and connective tissue: Secondary | ICD-10-CM

## 2014-11-20 DIAGNOSIS — I872 Venous insufficiency (chronic) (peripheral): Secondary | ICD-10-CM

## 2014-11-25 ENCOUNTER — Encounter: Payer: Self-pay | Admitting: Vascular Surgery

## 2014-11-26 ENCOUNTER — Other Ambulatory Visit: Payer: Self-pay | Admitting: Vascular Surgery

## 2014-11-26 ENCOUNTER — Ambulatory Visit (INDEPENDENT_AMBULATORY_CARE_PROVIDER_SITE_OTHER): Payer: BLUE CROSS/BLUE SHIELD | Admitting: Vascular Surgery

## 2014-11-26 ENCOUNTER — Encounter: Payer: Self-pay | Admitting: Vascular Surgery

## 2014-11-26 ENCOUNTER — Ambulatory Visit (HOSPITAL_COMMUNITY)
Admission: RE | Admit: 2014-11-26 | Discharge: 2014-11-26 | Disposition: A | Discharge status: Home / Self Care | Payer: BLUE CROSS/BLUE SHIELD | Source: Ambulatory Visit | Attending: Vascular Surgery | Admitting: Vascular Surgery

## 2014-11-26 VITALS — BP 129/83 | HR 82 | Ht 67.0 in | Wt 141.8 lb

## 2014-11-26 DIAGNOSIS — Z8739 Personal history of other diseases of the musculoskeletal system and connective tissue: Secondary | ICD-10-CM

## 2014-11-26 DIAGNOSIS — R208 Other disturbances of skin sensation: Secondary | ICD-10-CM | POA: Insufficient documentation

## 2014-11-26 DIAGNOSIS — G609 Hereditary and idiopathic neuropathy, unspecified: Secondary | ICD-10-CM

## 2014-11-26 DIAGNOSIS — I872 Venous insufficiency (chronic) (peripheral): Secondary | ICD-10-CM

## 2014-11-26 DIAGNOSIS — Z87898 Personal history of other specified conditions: Secondary | ICD-10-CM

## 2014-11-26 NOTE — Progress Notes (Addendum)
HISTORY AND PHYSICAL     CC:  Problems with LE Referring Provider:  No ref. provider found  Primary Care: Charletta Cousin  HPI: This is a 54 y.o. female who has been seen by Dr. Donnetta Hutching in the past for lower extremity discomfort with burning in both her feet and sometimes feeling cold.  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  She states when she and her [place] went to [place] and did a lot of walking, she had petechiae around her ankles and more burning than usual of her feet.  She is on neurontin, but this does not help the burning.  When she was here in 2013, she had a venous study that was almost completely normal with one focal area of venous reflux in the right superficial femoral vein.  She does have a hx of MS and is seen at [facility].  She also sees a migraine specialist at [facility] as well.     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  This has not been an issue for her.  Past Medical History  Diagnosis Date  . IBS (irritable bowel syndrome)   . Asthma   . Adopted     Patient has no known family history  . Headache(784.0)   . Venous aneurysm     Right side of neck    Past Surgical History  Procedure Laterality Date  . Abdominal hysterectomy    . Breast mass excision      Bilateral breast mass excision ( Benign)    Allergies  Allergen Reactions  . Imitrex [Sumatriptan]     Current Outpatient Prescriptions  Medication Sig Dispense Refill  . baclofen (LIORESAL) 20 MG tablet Take 20 mg by mouth as needed.     . bisoprolol (ZEBETA) 5 MG tablet Take 2.5 mg by mouth daily.    . cholecalciferol (VITAMIN D) 1000 UNITS tablet Take 15,000 Units by mouth daily.     . Cyanocobalamin (VITAMIN B 12 PO) Take by mouth.    . Dimethyl Fumarate 240 MG CPDR Take 1 capsule by mouth 2 (two) times daily.    Marland Kitchen gabapentin (NEURONTIN) 300 MG capsule Take 600 mg by mouth. PATIENT TAKING 900MG  AT BEDTIME    .  lisinopril (PRINIVIL,ZESTRIL) 40 MG tablet Take 40 mg by mouth daily.    . nortriptyline (PAMELOR) 10 MG capsule Take 20 mg by mouth at bedtime.     Marland Kitchen omeprazole (PRILOSEC) 20 MG capsule Take 20 mg by mouth daily.    . traMADol (ULTRAM) 50 MG tablet Take 50 mg by mouth every 6 (six) hours as needed.    . zolpidem (AMBIEN) 5 MG tablet Take 5 mg by mouth at bedtime as needed for sleep.    Marland Kitchen zonisamide (ZONEGRAN) 100 MG capsule Take by mouth.    . ALPRAZolam (XANAX) 0.25 MG tablet Take 0.25 mg by mouth at bedtime as needed.    . budesonide-formoterol (SYMBICORT) 160-4.5 MCG/ACT inhaler Inhale 2 puffs into the lungs 2 (two) times daily.    . butalbital-acetaminophen-caffeine (FIORICET, ESGIC) 50-325-40 MG per tablet Take 1 tablet by mouth 2 (two) times daily as needed.    Marland Kitchen levocetirizine (XYZAL) 5 MG tablet Take 5 mg by mouth as needed.      No current facility-administered medications for this visit.    No family history on file.  History   Social History  .  Marital Status: Married    Spouse Name: N/A  . Number of Children: N/A  . Years of Education: N/A   Occupational History  . Not on file.   Social History Main Topics  . Smoking status: Never Smoker   . Smokeless tobacco: Never Used  . Alcohol Use: No  . Drug Use: No  . Sexual Activity: Not on file   Other Topics Concern  . Not on file   Social History Narrative     ROS: [x]  Positive   [ ]  Negative   [ ]  All sytems reviewed and are negative  Cardiovascular: []  chest pain/pressure [x]  palpitations []  SOB lying flat []  DOE [x]  pain in legs while walking []  pain in feet when lying flat []  hx of DVT []  hx of phlebitis [x]  swelling in legs []  varicose veins  Pulmonary: []  productive cough [x]  asthma []  wheezing  Neurologic: [x]  weakness in arms/legs [x]  burning in feet []  numbness in []  arms []  legs [] difficulty speaking or slurred speech []  temporary loss of vision in one eye [x]   dizziness  Hematologic: []  bleeding problems []  problems with blood clotting easily  GI []  vomiting blood []  blood in stool  GU: []  burning with urination []  blood in urine  Psychiatric: []  hx of major depression  Integumentary: []  rashes []  ulcers  Constitutional: []  fever []  chills   PHYSICAL EXAMINATION:  Filed Vitals:   11/26/14 1602  BP: 129/83  Pulse: 82   Body mass index is 22.2 kg/(m^2).  General:  WDWN in NAD Gait: Not observed HENT: WNL, normocephalic; very small right neck venous aneurysm Pulmonary: normal non-labored breathing  Cardiac: RRR Skin: without rashes, without ulcers  Vascular Exam/Pulses:  Right Left  Popliteal 2+ (normal) 2+ (normal)  DP 2+ (normal) 2+ (normal)   Extremities: without ischemic changes, without Gangrene , without cellulitis; without open wounds; she does have a purplish discoloration of her midfoot down to her toes Musculoskeletal: no muscle wasting or atrophy  Neurologic: A&O X 3; Appropriate Affect ; SENSATION: normal; MOTOR FUNCTION:  moving all extremities equally. Speech is fluent/normal   Non-Invasive Vascular Imaging:   Lower extremity venous duplex reflux 11/26/14: 1.  Patent and compressible bilateral lower extremity deep venous system from the groin to the knee segments 2.  Patent, compressible and competent bilateral great and small saphenous veins 3.  Unable to reproduce previously documented deep venous reflux on today's exam.  Otherwise, unchanged from 11/09/11.  Pt meds includes: Statin:  No. Beta Blocker:  No. Aspirin:  No. ACEI:  No. ARB:  No. Other Antiplatelet/Anticoagulant:  No.    ASSESSMENT/PLAN:: 54 y.o. 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  She is followed by MD at [facility] for her MS and migraines.    She continues to have burning in her feet despite being on Neurontin.  She may benefit from another  medication, possibly Lyrica.   -She may have some component of Raynaud's disease, but difficult to say since she does not have issues with her hands. -the cause of her neuropathy is not an arterial as she has palpable distal pulses or venous issue as her venous study is normal. She may need to f/u with her neurologist sooner if this continues to be an issue. -f/u with [facility] as needed   Leontine Locket, PA-C [facility] [phone]  Clinic MD:  Pt seen and examined in conjunction  with Dr. Donnetta Hutching  I have examined the patient, reviewed and agree with above. No evidence of arterial or venous pathology. Palpable pedal pulses. Does have a may be Raynaud's in her feet. Her main complaint is a burning and stinging discomfort. Does appear to be consistent with neuropathy. She has had no response to Neurontin. Reassured her no dangerous issues regarding her feet. She will continue to discuss this with her neurologist at [facility]. She will see [place] on as-needed basis  EARLY, TODD, MD 11/26/2014 4:50 PM

## 2014-12-18 DIAGNOSIS — M79604 Pain in right leg: Secondary | ICD-10-CM | POA: Insufficient documentation

## 2015-06-19 DIAGNOSIS — R202 Paresthesia of skin: Secondary | ICD-10-CM | POA: Insufficient documentation

## 2015-06-30 ENCOUNTER — Other Ambulatory Visit: Payer: Self-pay | Admitting: Occupational Medicine

## 2015-06-30 ENCOUNTER — Ambulatory Visit: Payer: Self-pay

## 2015-06-30 DIAGNOSIS — M25562 Pain in left knee: Secondary | ICD-10-CM

## 2015-07-03 ENCOUNTER — Encounter: Payer: Self-pay | Admitting: *Deleted

## 2015-07-03 ENCOUNTER — Ambulatory Visit (INDEPENDENT_AMBULATORY_CARE_PROVIDER_SITE_OTHER): Payer: BLUE CROSS/BLUE SHIELD | Admitting: Family Medicine

## 2015-07-03 ENCOUNTER — Encounter: Payer: Self-pay | Admitting: Family Medicine

## 2015-07-03 ENCOUNTER — Other Ambulatory Visit (INDEPENDENT_AMBULATORY_CARE_PROVIDER_SITE_OTHER): Payer: BLUE CROSS/BLUE SHIELD

## 2015-07-03 VITALS — BP 118/84 | HR 72 | Ht 67.0 in | Wt 142.0 lb

## 2015-07-03 DIAGNOSIS — M25562 Pain in left knee: Secondary | ICD-10-CM

## 2015-07-03 DIAGNOSIS — S82002A Unspecified fracture of left patella, initial encounter for closed fracture: Secondary | ICD-10-CM | POA: Diagnosis not present

## 2015-07-03 DIAGNOSIS — S82009A Unspecified fracture of unspecified patella, initial encounter for closed fracture: Secondary | ICD-10-CM | POA: Insufficient documentation

## 2015-07-03 HISTORY — DX: Unspecified fracture of unspecified patella, initial encounter for closed fracture: S82.009A

## 2015-07-03 MED ORDER — VITAMIN D (ERGOCALCIFEROL) 1.25 MG (50000 UNIT) PO CAPS: 50000.0000 [IU] | ORAL_CAPSULE | ORAL | Status: DC

## 2015-07-03 NOTE — Progress Notes (Signed)
[name] [address], [place] [address] Phone: [phone] Subjective:     CC: Left knee pain  [id number] Cynthia Nichols is a 54 y.o. female coming in with complaint of left knee pain. Patient on October 1 at work did fall directly onto her left knee. Patient pain initially with some mild swelling. States since then it seems to be getting worse. Hurting her with any type of ambulation. Patient was seen by another provider 3 days ago and was to have x-rays. These were reviewed by me and showed no significant bony normality at that time. Patient states though that it seems to be on the anterior aspect of the knee. Denies any locking or giving out on her but is very uncomfortable. Patient states that it is affecting daily activities and can be been sore when she rolls over at night. Rates the severity of pain a 7 out of 10. Has not tried any anti-inflammatories.  Past Medical History  Diagnosis Date  . IBS (irritable bowel syndrome)   . Asthma   . Adopted     Patient has no known family history  . Headache(784.0)   . Venous aneurysm     Right side of neck   Past Surgical History  Procedure Laterality Date  . Abdominal hysterectomy    . Breast mass excision      Bilateral breast mass excision ( Benign)   Social History  Substance Use Topics  . Smoking status: Never Smoker   . Smokeless tobacco: Never Used  . Alcohol Use: No   Allergies  Allergen Reactions  . Imitrex [Sumatriptan]    No family history on file.      Past medical history, social, surgical and family history all reviewed in electronic medical record.   Review of Systems: No headache, visual changes, nausea, vomiting, diarrhea, constipation, dizziness, abdominal pain, skin rash, fevers, chills, night sweats, weight loss, swollen lymph nodes, body aches, joint swelling, muscle aches, chest pain, shortness of breath, mood changes.   Objective Blood pressure 118/84,  height 5\' 7"  (1.702 m), weight 142 lb (64.411 kg).  General: No apparent distress alert and oriented x3 mood and affect normal, dressed appropriately.  HEENT: Pupils equal, extraocular movements intact  Respiratory: Patient's speak in full sentences and does not appear short of breath  Cardiovascular: No lower extremity edema, non tender, no erythema  Skin: Warm dry intact with no signs of infection or rash on extremities or on axial skeleton.  Abdomen: Soft nontender  Neuro: Cranial nerves II through XII are intact, neurovascularly intact in all extremities with 2+ DTRs and 2+ pulses.  Lymph: No lymphadenopathy of posterior or anterior cervical chain or axillae bilaterally.  Gait normal with good balance and coordination.  MSK:  Non tender with full range of motion and good stability and symmetric strength and tone of shoulders, elbows, wrist, hip, and ankles bilaterally.  Knee: Left Normal to inspection with no erythema or effusion or obvious bony abnormalities. Tender to palpation over the patella itself ROM full in flexion and extension and lower leg rotation. Ligaments with solid consistent endpoints including ACL, PCL, LCL, MCL. Negative Mcmurray's, Apley's, and Thessalonian tests. Mild painful patellar compression. Patellar glide without crepitus. Patellar and quadriceps tendons unremarkable. Hamstring and quadriceps strength is normal.  Hunter lateral knee unremarkable  MSK US performed of: Left knee This study was ordered, performed, and interpreted by Charlann Boxer D.O.  Knee: Oblique fracture noted with mild cortical defect  noted of the patella itself. Non-healing overall. All compartments show no significant arthritis and no meniscal injury noted.  IMPRESSION:  Hairline fracture of the patella oblique     Impression and Recommendations:     This case required medical decision making of moderate complexity.

## 2015-07-03 NOTE — Progress Notes (Signed)
Pre visit review using our clinic review tool, if applicable. No additional management support is needed unless otherwise documented below in the visit note. 

## 2015-07-03 NOTE — Assessment & Plan Note (Signed)
Oblique in nature. No signs of horizontal fracture so likely will heal. We discussed the possibility of immobilization the patient would rather try to angulate. We discussed icing, we discussed high-dose vitamin D with her having a history of multiple sclerosis that likely will slow down healing. We did keep patient out of work for the next 10 days. Patient and will come back and see me and we will make sure that there is callus formation. If no callus formation patient will be put in an immobilizer.

## 2015-07-03 NOTE — Patient Instructions (Signed)
Good to see you Vitamin D once weekly Ice 10 minutes after exercise and before bed Avoid direct contact on the knee.  Hairline patella fracture oblique non healing.  See me again after working the next weekend.

## 2015-07-14 ENCOUNTER — Ambulatory Visit (INDEPENDENT_AMBULATORY_CARE_PROVIDER_SITE_OTHER): Payer: BLUE CROSS/BLUE SHIELD | Admitting: Family Medicine

## 2015-07-14 ENCOUNTER — Encounter: Payer: Self-pay | Admitting: Family Medicine

## 2015-07-14 VITALS — BP 114/68 | HR 74 | Ht 67.0 in | Wt 143.0 lb

## 2015-07-14 DIAGNOSIS — I868 Varicose veins of other specified sites: Secondary | ICD-10-CM

## 2015-07-14 DIAGNOSIS — S82002A Unspecified fracture of left patella, initial encounter for closed fracture: Secondary | ICD-10-CM | POA: Diagnosis not present

## 2015-07-14 MED ORDER — NITROGLYCERIN 0.2 MG/HR TD PT24: MEDICATED_PATCH | TRANSDERMAL | Status: DC

## 2015-07-14 NOTE — Progress Notes (Signed)
Pre visit review using our clinic review tool, if applicable. No additional management support is needed unless otherwise documented below in the visit note. 

## 2015-07-14 NOTE — Patient Instructions (Signed)
Good to see you Ice is still your friend.  Continue the vitamin D Avoid direct impact on the knee.  I would wear the immobilizer as much as possible.  Extra 1000 IU daily of vitamin D  Nitroglycerin Protocol   Apply 1/4 nitroglycerin patch to affected area daily.  Change position of patch within the affected area every 24 hours.  You may experience a headache during the first 1-2 weeks of using the patch, these should subside.  If you experience headaches after beginning nitroglycerin patch treatment, you may take your preferred over the counter pain reliever.  Another side effect of the nitroglycerin patch is skin irritation or rash related to patch adhesive.  Please notify our office if you develop more severe headaches or rash, and stop the patch.  Tendon healing with nitroglycerin patch may require 12 to 24 weeks depending on the extent of injury.  Men should not use if taking Viagra, Cialis, or Levitra.   Do not use if you have migraines or rosacea.  See me again in 3 weeks.

## 2015-07-14 NOTE — Assessment & Plan Note (Signed)
Discussed with patient at great length. She has had this since she was 54 years of age. Has decrease in size over the years. Patient will monitor. Patient knows the risk of doing the nitroglycerin and we discussed today in the office as well as again on the phone. Patient was elected again to try the nitroglycerin.

## 2015-07-14 NOTE — Progress Notes (Signed)
[name] [address], [place] [address] Phone: [phone] Subjective:     CC: Left knee pain follow up  [id number] Cynthia Nichols is a 54 y.o. female coming in with complaint of left knee pain. Patient on October 1 at work did fall directly onto her left knee. Patient was seen by me previously and was diagnosed with a hairline fracture of the patella. We discussed different treatment options in with patient's multiple sclerosis patient elected try conservative therapy. Patient was put on high-dose vitamin D, and icing protocol, racing but not full immobilization. Patient states she did open a Workmen's Compensation case and did see another provider. Same diagnosis was presented. Patient was given the immobilizer to wear on a regular basis which she will not do. Patient did wear it at work and was helpful but it is very annoying. States that minorly improvement but very minimal overall.  Past Medical History  Diagnosis Date  . IBS (irritable bowel syndrome)   . Asthma   . Adopted     Patient has no known family history  . Headache(784.0)   . Venous aneurysm     Right side of neck   Past Surgical History  Procedure Laterality Date  . Abdominal hysterectomy    . Breast mass excision      Bilateral breast mass excision ( Benign)   Social History  Substance Use Topics  . Smoking status: Never Smoker   . Smokeless tobacco: Never Used  . Alcohol Use: No   Allergies  Allergen Reactions  . Imitrex [Sumatriptan]    No family history on file.      Past medical history, social, surgical and family history all reviewed in electronic medical record.   Review of Systems: No headache, visual changes, nausea, vomiting, diarrhea, constipation, dizziness, abdominal pain, skin rash, fevers, chills, night sweats, weight loss, swollen lymph nodes, body aches, joint swelling, muscle aches, chest pain, shortness of breath, mood changes.    Objective Blood pressure 114/68, pulse 74, height 5\' 7"  (1.702 m), weight 143 lb (64.864 kg), SpO2 98 %.  General: No apparent distress alert and oriented x3 mood and affect normal, dressed appropriately.  HEENT: Pupils equal, extraocular movements intact  Respiratory: Patient's speak in full sentences and does not appear short of breath  Cardiovascular: No lower extremity edema, non tender, no erythema  Skin: Warm dry intact with no signs of infection or rash on extremities or on axial skeleton.  Abdomen: Soft nontender  Neuro: Cranial nerves II through XII are intact, neurovascularly intact in all extremities with 2+ DTRs and 2+ pulses.  Lymph: No lymphadenopathy of posterior or anterior cervical chain or axillae bilaterally.  Gait normal with good balance and coordination.  MSK:  Non tender with full range of motion and good stability and symmetric strength and tone of shoulders, elbows, wrist, hip, and ankles bilaterally.  Knee: Left Normal to inspection with no erythema or effusion or obvious bony abnormalities. Tender to palpation over the patella still present. ROM full in flexion and extension and lower leg rotation. Ligaments with solid consistent endpoints including ACL, PCL, LCL, MCL. Negative Mcmurray's, Apley's, and Thessalonian tests. Mild painful patellar compression. Patellar glide without crepitus. Patellar and quadriceps tendons unremarkable. Hamstring and quadriceps strength is normal.  Hunter lateral knee unremarkable  MSK US performed of: Left knee This study was ordered, performed, and interpreted by Charlann Boxer D.O.  Knee: Oblique fracture noted with mild cortical defect noted of  the patella itself. Continues to see very minimal callus formation in the area. All compartments show no significant arthritis and no meniscal injury noted.  IMPRESSION:  Hairline fracture of the patella oblique with very minimal callus formation     Impression and  Recommendations:     This case required medical decision making of moderate complexity.

## 2015-07-14 NOTE — Assessment & Plan Note (Addendum)
Continues to have very slow healing. Encourage her to add 1000 units of vitamin D Tuesday 50,000 units weekly. We also discussed with patient about wearing the immobilizer and a more regular basis. We discussed that if this does not heal she will need surgical intervention. Patient has elected to also try and nitroglycerin patches to try to aid in healing. Discussed that this is more experimental. Patient warned the potential side effects. Of headaches are too bad she will  Decrease immediately.patient come back and see me again in 3 weeks.

## 2015-07-28 ENCOUNTER — Ambulatory Visit: Payer: BLUE CROSS/BLUE SHIELD | Admitting: Physical Therapy

## 2015-08-05 ENCOUNTER — Other Ambulatory Visit (INDEPENDENT_AMBULATORY_CARE_PROVIDER_SITE_OTHER): Payer: BLUE CROSS/BLUE SHIELD

## 2015-08-05 ENCOUNTER — Encounter: Payer: Self-pay | Admitting: Family Medicine

## 2015-08-05 ENCOUNTER — Ambulatory Visit (INDEPENDENT_AMBULATORY_CARE_PROVIDER_SITE_OTHER): Payer: BLUE CROSS/BLUE SHIELD | Admitting: Family Medicine

## 2015-08-05 VITALS — BP 118/70 | HR 76 | Ht 67.0 in | Wt 143.0 lb

## 2015-08-05 DIAGNOSIS — S82002D Unspecified fracture of left patella, subsequent encounter for closed fracture with routine healing: Secondary | ICD-10-CM

## 2015-08-05 NOTE — Patient Instructions (Signed)
Good to see you 2 more weeks Send me a message after seeing Cynthia Nichols and tell me what they say We may need to consider a hinge brace and possibly fosamax but hopefully we will see healing

## 2015-08-05 NOTE — Progress Notes (Signed)
[name] [place], [place] [address] Phone: [phone] Subjective:     CC: Left knee pain follow up  [id number] Cynthia Nichols is a 54 y.o. female coming in with complaint of left knee pain. Patient on October 1 at work did fall directly onto her left knee. Patient was seen by me previously and was diagnosed with a hairline fracture of the patella. Patient is 6 weeks out from the injury. Patient was in a needle immobilizer initially. Patient was to start increasing her range of motion somewhat. Continuing on  the high-dose vitamin D supplementation. Patient states overall she has not notice any significant improvement. Patient did see an orthopedic and they said there was no significant healing. Continues the immobilizer.  Past Medical History  Diagnosis Date  . IBS (irritable bowel syndrome)   . Asthma   . Adopted     Patient has no known family history  . Headache(784.0)   . Venous aneurysm     Right side of neck   Past Surgical History  Procedure Laterality Date  . Abdominal hysterectomy    . Breast mass excision      Bilateral breast mass excision ( Benign)   Social History  Substance Use Topics  . Smoking status: Never Smoker   . Smokeless tobacco: Never Used  . Alcohol Use: No   Allergies  Allergen Reactions  . Imitrex [Sumatriptan]    No family history on file.      Past medical history, social, surgical and family history all reviewed in electronic medical record.   Review of Systems: No headache, visual changes, nausea, vomiting, diarrhea, constipation, dizziness, abdominal pain, skin rash, fevers, chills, night sweats, weight loss, swollen lymph nodes, body aches, joint swelling, muscle aches, chest pain, shortness of breath, mood changes.   Objective Blood pressure 118/70, pulse 76, height 5\' 7"  (1.702 m), weight 143 lb (64.864 kg), SpO2 97 %.  General: No apparent distress alert and oriented x3 mood and  affect normal, dressed appropriately.  HEENT: Pupils equal, extraocular movements intact  Respiratory: Patient's speak in full sentences and does not appear short of breath  Cardiovascular: No lower extremity edema, non tender, no erythema  Skin: Warm dry intact with no signs of infection or rash on extremities or on axial skeleton.  Abdomen: Soft nontender  Neuro: Cranial nerves II through XII are intact, neurovascularly intact in all extremities with 2+ DTRs and 2+ pulses.  Lymph: No lymphadenopathy of posterior or anterior cervical chain or axillae bilaterally.  Gait normal with good balance and coordination.  MSK:  Non tender with full range of motion and good stability and symmetric strength and tone of shoulders, elbows, wrist, hip, and ankles bilaterally.  Knee: Left Normal to inspection with no erythema or effusion or obvious bony abnormalities. Tender to palpation over the patella still present with no significant change ROM full in flexion and extension and lower leg rotation. Ligaments with solid consistent endpoints including ACL, PCL, LCL, MCL. Negative Mcmurray's, Apley's, and Thessalonian tests. Mild painful patellar compression. Patellar glide without crepitus. Patellar and quadriceps tendons unremarkable. Hamstring and quadriceps strength is normal.  Hunter lateral knee unremarkable  MSK US performed of: Left knee This study was ordered, performed, and interpreted by Charlann Boxer D.O.  Knee: Oblique fracture noted with mild cortical defect noted of the patella itself. Continues to see very minimal callus formation in the area, would say there is no interval healing from previous scan.  All compartments show no significant arthritis and no meniscal injury noted.  IMPRESSION:  Hairline fracture of the patella oblique with very minimal callus formation, no change     Impression and Recommendations:     This case required medical decision making of moderate  complexity.

## 2015-08-05 NOTE — Assessment & Plan Note (Signed)
Still no significant healing.   Icing [employer] immobilizer. Patient will come back again in 2-3 weeks. Increasing callus formation we will continue with the conservative therapy and start increasing patient's range of motion. Patient does not have any significant callus formation. Discussed with orthopedic surgery Best options. She could be a candidate for bisphosphonates.

## 2015-08-05 NOTE — Progress Notes (Signed)
Pre visit review using our clinic review tool, if applicable. No additional management support is needed unless otherwise documented below in the visit note. 

## 2015-08-06 ENCOUNTER — Ambulatory Visit: Payer: BLUE CROSS/BLUE SHIELD | Admitting: Physical Therapy

## 2015-08-13 ENCOUNTER — Other Ambulatory Visit (INDEPENDENT_AMBULATORY_CARE_PROVIDER_SITE_OTHER): Payer: BLUE CROSS/BLUE SHIELD

## 2015-08-13 ENCOUNTER — Encounter: Payer: Self-pay | Admitting: Family Medicine

## 2015-08-13 ENCOUNTER — Ambulatory Visit (INDEPENDENT_AMBULATORY_CARE_PROVIDER_SITE_OTHER): Payer: BLUE CROSS/BLUE SHIELD | Admitting: Family Medicine

## 2015-08-13 VITALS — BP 116/82 | HR 75 | Wt 146.0 lb

## 2015-08-13 DIAGNOSIS — S92301A Fracture of unspecified metatarsal bone(s), right foot, initial encounter for closed fracture: Secondary | ICD-10-CM

## 2015-08-13 DIAGNOSIS — S92353A Displaced fracture of fifth metatarsal bone, unspecified foot, initial encounter for closed fracture: Secondary | ICD-10-CM | POA: Insufficient documentation

## 2015-08-13 DIAGNOSIS — M79671 Pain in right foot: Secondary | ICD-10-CM

## 2015-08-13 DIAGNOSIS — S82002D Unspecified fracture of left patella, subsequent encounter for closed fracture with routine healing: Secondary | ICD-10-CM

## 2015-08-13 HISTORY — DX: Displaced fracture of fifth metatarsal bone, unspecified foot, initial encounter for closed fracture: S92.353A

## 2015-08-13 NOTE — Progress Notes (Signed)
[name] [place],  [address] Phone: [phone] Subjective:     CC: Right foot pain.   [id number] Cynthia Nichols is a 54 y.o. female coming in with complaint of foot pain. Patient has been working in the knee immobilizer secondary to a patellar fracture. Patient states she went to pick something up and stubbed her right foot. Surgery pain immediately on the dorsal aspect of the foot as well as the lateral aspect of the foot. Did see another provider and was told that she had some type of fracture. Patient is here for further evaluation. Patient was put in a post op boot. An states that it is not helping significantly. Taking more of her pain medication.  Past Medical History  Diagnosis Date  . IBS (irritable bowel syndrome)   . Asthma   . Adopted     Patient has no known family history  . Headache(784.0)   . Venous aneurysm     Right side of neck   Past Surgical History  Procedure Laterality Date  . Abdominal hysterectomy    . Breast mass excision      Bilateral breast mass excision ( Benign)   Social History  Substance Use Topics  . Smoking status: Never Smoker   . Smokeless tobacco: Never Used  . Alcohol Use: No   Allergies  Allergen Reactions  . Imitrex [Sumatriptan]    History reviewed. No pertinent family history.      Past medical history, social, surgical and family history all reviewed in electronic medical record.   Review of Systems: No headache, visual changes, nausea, vomiting, diarrhea, constipation, dizziness, abdominal pain, skin rash, fevers, chills, night sweats, weight loss, swollen lymph nodes, body aches, joint swelling, muscle aches, chest pain, shortness of breath, mood changes.   Objective Blood pressure 116/82, pulse 75, weight 146 lb (66.225 kg), SpO2 97 %.  General: No apparent distress alert and oriented x3 mood and affect normal, dressed appropriately.  HEENT: Pupils equal,  extraocular movements intact  Respiratory: Patient's speak in full sentences and does not appear short of breath  Cardiovascular: No lower extremity edema, non tender, no erythema  Skin: Warm dry intact with no signs of infection or rash on extremities or on axial skeleton.  Abdomen: Soft nontender  Neuro: Cranial nerves II through XII are intact, neurovascularly intact in all extremities with 2+ DTRs and 2+ pulses.  Lymph: No lymphadenopathy of posterior or anterior cervical chain or axillae bilaterally.  Gait normal with good balance and coordination.  MSK:  Non tender with full range of motion and good stability and symmetric strength and tone of shoulders, elbows, wrist, hip, and ankles bilaterally.  Knee: Left Normal to inspection with no erythema or effusion or obvious bony abnormalities. Tender to palpation over the patella still present with no significant change ROM full in flexion and extension and lower leg rotation. Ligaments with solid consistent endpoints including ACL, PCL, LCL, MCL. Negative Mcmurray's, Apley's, and Thessalonian tests. Mild painful patellar compression. Patellar glide without crepitus. Patellar and quadriceps tendons unremarkable. Hamstring and quadriceps strength is normal.  contralateral knee unremarkable No Change from previous exam Right foot exam shows the patient does have dorsal swelling. Severely tender to palpation of the fifth metatarsal just proximal to the MTP joint as well as at the phalanx. Patient does have mild angulation of the phalanx compared to the contralateral side. Neurovascularly intact distally.  MSK US performed of: Right foot pain This  study was ordered, performed, and interpreted by Charlann Boxer D.O.  Right foot: Patient's right foot shows the patient does have a minimally displaced fracture of the fifth phalanx as well as the fifth metatarsal. Both seem to be extra articular. Hypoechoic changes and soft tissue swelling noted.  Increasing Doppler flow.  IMPRESSION: Fracture of the fifth phalanx as well as the fifth metatarsal. Extra-articular. No significant displacement.   Impression and Recommendations:     This case required medical decision making of moderate complexity.

## 2015-08-13 NOTE — Assessment & Plan Note (Signed)
Dizziness and more of a fifth metatarsal fracture. We discussed icing regimen, home exercises, and remaining active. We discussed what activities to do an which was potentially avoid. We discussed continuing the immobilization on her patella. Patient has any worsening symptoms she will need to come back sooner. Because patient has had 2 recent injuries and falls I do feel that a high-dose vitamin D will help with bone healing as well as hopefully endurance and strength. I think the patient should have Fosamax as well. At that time would like to see patient back again in 2 weeks. Seated work only for the next 2 weeks.

## 2015-08-13 NOTE — Progress Notes (Signed)
Pre visit review using our clinic review tool, if applicable. No additional management support is needed unless otherwise documented below in the visit note. 

## 2015-08-13 NOTE — Assessment & Plan Note (Signed)
Continue current therapy 

## 2015-08-13 NOTE — Patient Instructions (Signed)
Good to see you Try the cam walker Ice is your friend Fosamax weekly for next 8 weeks See me again in 2ish weeks.  We will get a note for work.  Happy holidays!

## 2015-08-26 ENCOUNTER — Ambulatory Visit (INDEPENDENT_AMBULATORY_CARE_PROVIDER_SITE_OTHER)
Admission: RE | Admit: 2015-08-26 | Discharge: 2015-08-26 | Disposition: A | Discharge status: Home / Self Care | Payer: BLUE CROSS/BLUE SHIELD | Source: Ambulatory Visit | Attending: Family Medicine | Admitting: Family Medicine

## 2015-08-26 ENCOUNTER — Encounter: Payer: Self-pay | Admitting: Family Medicine

## 2015-08-26 ENCOUNTER — Ambulatory Visit (INDEPENDENT_AMBULATORY_CARE_PROVIDER_SITE_OTHER): Payer: BLUE CROSS/BLUE SHIELD | Admitting: Family Medicine

## 2015-08-26 ENCOUNTER — Other Ambulatory Visit (INDEPENDENT_AMBULATORY_CARE_PROVIDER_SITE_OTHER): Payer: BLUE CROSS/BLUE SHIELD

## 2015-08-26 VITALS — BP 122/84 | HR 84 | Ht 67.0 in | Wt 143.0 lb

## 2015-08-26 DIAGNOSIS — M79671 Pain in right foot: Secondary | ICD-10-CM

## 2015-08-26 DIAGNOSIS — S82002D Unspecified fracture of left patella, subsequent encounter for closed fracture with routine healing: Secondary | ICD-10-CM

## 2015-08-26 DIAGNOSIS — S92301A Fracture of unspecified metatarsal bone(s), right foot, initial encounter for closed fracture: Secondary | ICD-10-CM

## 2015-08-26 MED ORDER — ALENDRONATE SODIUM 70 MG PO TABS: 70.0000 mg | ORAL_TABLET | ORAL | Status: DC

## 2015-08-26 NOTE — Progress Notes (Signed)
[facility] [place], [place] [address] Phone: [phone] Subjective:     CC: Right foot pain and knee pain  [id number] Cynthia Nichols is a 54 y.o. female coming in with complaint of foot pain. Patient was seen previously and did have a fracture of the fifth phalanx and metatarsal. Patient has been in a [employer]. Not making significant improvement. Patient has had difficulty with healing of her patellar fracture as well. Patient is now out of the immobilization brace though for the knee. Continues to have pain on both. Patient has been able to walk and work with the [employer] on but unfortunately she has significant swelling at the end of the day which seems to make the pain worse. Was unable to get the bisphosphonate filled. Patient continues on the vitamin D with no significant side effects.  Past Medical History  Diagnosis Date  . IBS (irritable bowel syndrome)   . Asthma   . Adopted     Patient has no known family history  . Headache(784.0)   . Venous aneurysm     Right side of neck   Past Surgical History  Procedure Laterality Date  . Abdominal hysterectomy    . Breast mass excision      Bilateral breast mass excision ( Benign)   Social History  Substance Use Topics  . Smoking status: Never Smoker   . Smokeless tobacco: Never Used  . Alcohol Use: No   Allergies  Allergen Reactions  . Imitrex [Sumatriptan]    No family history on file.      Past medical history, social, surgical and family history all reviewed in electronic medical record.   Review of Systems: No headache, visual changes, nausea, vomiting, diarrhea, constipation, dizziness, abdominal pain, skin rash, fevers, chills, night sweats, weight loss, swollen lymph nodes, body aches, joint swelling, muscle aches, chest pain, shortness of breath, mood changes.   Objective Blood pressure 122/84, pulse 84, height 5\' 7"  (1.702 m), weight 143 lb (64.864 kg),  SpO2 97 %.  General: No apparent distress alert and oriented x3 mood and affect normal, dressed appropriately.  HEENT: Pupils equal, extraocular movements intact  Respiratory: Patient's speak in full sentences and does not appear short of breath  Cardiovascular: No lower extremity edema, non tender, no erythema  Skin: Warm dry intact with no signs of infection or rash on extremities or on axial skeleton.  Abdomen: Soft nontender  Neuro: Cranial nerves II through XII are intact, neurovascularly intact in all extremities with 2+ DTRs and 2+ pulses.  Lymph: No lymphadenopathy of posterior or anterior cervical chain or axillae bilaterally.  Gait normal with good balance and coordination.  MSK:  Non tender with full range of motion and good stability and symmetric strength and tone of shoulders, elbows, wrist, hip, and ankles bilaterally.  Knee: Left Normal to inspection with no erythema or effusion or obvious bony abnormalities. Tender to palpation over the patella still present with no significant change ROM full in flexion and extension and lower leg rotation. Ligaments with solid consistent endpoints including ACL, PCL, LCL, MCL. Negative Mcmurray's, Apley's, and Thessalonian tests. Mild painful patellar compression. Patellar glide without crepitus. Patellar and quadriceps tendons unremarkable. Hamstring and quadriceps strength is normal.  contralateral knee unremarkable No Change from previous exam Right foot exam shows the patient does have dorsal swelling. Possibly worse than previous exam Severely tender to palpation of the fifth metatarsal just proximal to the MTP joint as  well as at the phalanx. Angulation of the toe may be somewhat better.  MSK US performed of: Right foot pain This study was ordered, performed, and interpreted by Charlann Boxer D.O.  Right foot: Patient's right foot shows the patient does have a minimally displaced fracture of the fifth phalanx as well as the fifth  metatarsal. Both seem to be extra articular. Continue soft tissue swelling. No significant callus formation noted.  IMPRESSION: Fracture of the fifth phalanx as well as the fifth metatarsal. Extra-articular. No significant displacement. With no significant callus formation   Impression and Recommendations:     This case required medical decision making of moderate complexity.

## 2015-08-26 NOTE — Progress Notes (Signed)
Pre visit review using our clinic review tool, if applicable. No additional management support is needed unless otherwise documented below in the visit note. 

## 2015-08-26 NOTE — Patient Instructions (Signed)
Good to see you Sorry for the bad news Duexis 3 times daily for 6 days  Pennsaid if it helps Try the fosamax Try pennsaid  Wear boot still See me again in 4 weeks.  Happy holidays!

## 2015-08-26 NOTE — Assessment & Plan Note (Signed)
Patient does not show any significant healing as well. We discussed icing regimen and home exercises. We discussed continuing the [facility] [place] [facility] and patient given a prescription for bisphosphonate. Patient also given a prescription for a walker if necessary. Patient will come back and see me again in 3-4 weeks for further evaluation and treatment.

## 2015-08-26 NOTE — Assessment & Plan Note (Signed)
Still no significant healing noted on ultrasound today. Patient is doing the vitamin D supplementation but is not taking the this phosphate. Encourage patient to do this and consented a new prescription. We discussed icing regimen. Patient is being followed up with a Workmen's Comp. physician. Patient will likely take another 6 weeks to heal fully.

## 2015-09-23 ENCOUNTER — Other Ambulatory Visit (INDEPENDENT_AMBULATORY_CARE_PROVIDER_SITE_OTHER): Payer: BLUE CROSS/BLUE SHIELD

## 2015-09-23 ENCOUNTER — Encounter: Payer: Self-pay | Admitting: Family Medicine

## 2015-09-23 ENCOUNTER — Ambulatory Visit (INDEPENDENT_AMBULATORY_CARE_PROVIDER_SITE_OTHER)
Admission: RE | Admit: 2015-09-23 | Discharge: 2015-09-23 | Disposition: A | Discharge status: Home / Self Care | Payer: BLUE CROSS/BLUE SHIELD | Source: Ambulatory Visit | Attending: Family Medicine | Admitting: Family Medicine

## 2015-09-23 ENCOUNTER — Ambulatory Visit (INDEPENDENT_AMBULATORY_CARE_PROVIDER_SITE_OTHER): Payer: BLUE CROSS/BLUE SHIELD | Admitting: Family Medicine

## 2015-09-23 VITALS — BP 110/80 | HR 76 | Ht 67.0 in | Wt 143.0 lb

## 2015-09-23 DIAGNOSIS — G35 Multiple sclerosis: Secondary | ICD-10-CM

## 2015-09-23 DIAGNOSIS — S92351D Displaced fracture of fifth metatarsal bone, right foot, subsequent encounter for fracture with routine healing: Secondary | ICD-10-CM

## 2015-09-23 DIAGNOSIS — S82002D Unspecified fracture of left patella, subsequent encounter for closed fracture with routine healing: Secondary | ICD-10-CM | POA: Diagnosis not present

## 2015-09-23 DIAGNOSIS — S92301A Fracture of unspecified metatarsal bone(s), right foot, initial encounter for closed fracture: Secondary | ICD-10-CM

## 2015-09-23 MED ORDER — ALENDRONATE SODIUM 70 MG PO TABS: 70.0000 mg | ORAL_TABLET | ORAL | Status: DC

## 2015-09-23 NOTE — Assessment & Plan Note (Signed)
Contribute in. Discuss patient should be following up with rheumatologist or neurologist. Patient is considering this in the future.

## 2015-09-23 NOTE — Assessment & Plan Note (Signed)
Healing slowly and patient is out of the brace. We'll continue to follow up with the other physician. I do think patient will continue to heal well. Likely slow. Patient's history of multiple sclerosis skin should into the slow healing process.

## 2015-09-23 NOTE — Progress Notes (Signed)
[name] [place], [place] [address] Phone: [phone] Subjective:     CC: Right foot pain and knee pain  [id number] Cynthia Nichols is a 55 y.o. female coming in with complaint of foot pain. Patient was seen previously and did have a fracture of the fifth phalanx and metatarsal. Patient has been in a [employer]. Patient states that the pain seems to be improving overall button is giving her more trouble with the left knee pain. Patient states that it is more of a dull, throbbing aching sensation. States that if she walks outside the boot that ache occurs more often. Continues to wear the Cam Walker on her regular basis. Denies any numbness and states that the swelling has improved some.  Patient does have a patellar fracture that is slowly healing and patient has following up with a Workmen's Comp. physician. Patient is out of the brace at this time. States that seems to be very uncomfortable but overall seems to be improving slowly.  Past Medical History  Diagnosis Date  . IBS (irritable bowel syndrome)   . Asthma   . Adopted     Patient has no known family history  . Headache(784.0)   . Venous aneurysm     Right side of neck   Past Surgical History  Procedure Laterality Date  . Abdominal hysterectomy    . Breast mass excision      Bilateral breast mass excision ( Benign)   Social History  Substance Use Topics  . Smoking status: Never Smoker   . Smokeless tobacco: Never Used  . Alcohol Use: No   Allergies  Allergen Reactions  . Imitrex [Sumatriptan]    No family history on file. no family history that is pertinent to chief complaint    Past medical history, social, surgical and family history all reviewed in electronic medical record.   Review of Systems: No headache, visual changes, nausea, vomiting, diarrhea, constipation, dizziness, abdominal pain, skin rash, fevers, chills, night sweats, weight loss, swollen lymph  nodes, body aches, joint swelling, muscle aches, chest pain, shortness of breath, mood changes.   Objective Blood pressure 110/80, pulse 76, height 5\' 7"  (1.702 m), weight 143 lb (64.864 kg), SpO2 98 %.  General: No apparent distress alert and oriented x3 mood and affect normal, dressed appropriately.  HEENT: Pupils equal, extraocular movements intact  Respiratory: Patient's speak in full sentences and does not appear short of breath  Cardiovascular: No lower extremity edema, non tender, no erythema  Skin: Warm dry intact with no signs of infection or rash on extremities or on axial skeleton.  Abdomen: Soft nontender  Neuro: Cranial nerves II through XII are intact, neurovascularly intact in all extremities with 2+ DTRs and 2+ pulses.  Lymph: No lymphadenopathy of posterior or anterior cervical chain or axillae bilaterally.  Gait normal with good balance and coordination.  MSK:  Non tender with full range of motion and good stability and symmetric strength and tone of shoulders, elbows, wrist, hip, and ankles bilaterally.  Knee: Left Normal to inspection with no erythema or effusion or obvious bony abnormalities. Mild tenderness to palpation over the patella but some mild improvement. ROM full in flexion and extension and lower leg rotation. Ligaments with solid consistent endpoints including ACL, PCL, LCL, MCL. Negative Mcmurray's, Apley's, and Thessalonian tests. Mild painful patellar compression. Patellar glide without crepitus. Patellar and quadriceps tendons unremarkable. Hamstring and quadriceps strength is normal.  contralateral knee unremarkable No Change  from previous exam Right foot exam shows the patient does have dorsal swelling but improving.. I will tenderness noted over the lateral aspect of the foot. Minimal pain over the fifth toe itself. Neurovascular intact distally.  MSK US performed of: Right foot pain This study was ordered, performed, and interpreted by Charlann Boxer  D.O.  Right foot: Patient's right foot shows the patient does have a minimally displaced fracture of the fifth phalanx as well as the fifth metatarsal. Good callus formation over the fifth metatarsal fracture but only minimal callus formation noted over the fifth phalanx.  IMPRESSION: Moderate healing of the fifth metatarsal fracture with good callus formation with mild healing of the proximal phalanx fracture. Images saved and internal hard drive.   Impression and Recommendations:     This case required medical decision making of moderate complexity.

## 2015-09-23 NOTE — Assessment & Plan Note (Addendum)
Much improved. Continue on the Fosamax. Patient will continue on the once weekly vitamin D as well. We discussed icing regimen. We discussed proper shoewear with a rigid sole shoe. Patient will come back and see me again in 4-6 weeks for further evaluation and treatment. Sent to physical therapy for range of motion and strengthening exercises now that patient is out of the boot.

## 2015-09-23 NOTE — Patient Instructions (Signed)
Good  See you Happy New Year! Ice still when you need it.  Wear boot at work for 1 week then try a shoe OK to use shoe at home now  Start with biking and walking in pool for working out.  Continue fosamax for another 4 weeks See me again in 4 weeks.

## 2015-09-23 NOTE — Progress Notes (Signed)
Pre visit review using our clinic review tool, if applicable. No additional management support is needed unless otherwise documented below in the visit note. 

## 2015-09-26 ENCOUNTER — Ambulatory Visit: Payer: PRIVATE HEALTH INSURANCE | Attending: Orthopedic Surgery

## 2015-09-26 ENCOUNTER — Ambulatory Visit: Payer: BLUE CROSS/BLUE SHIELD

## 2015-09-26 DIAGNOSIS — R269 Unspecified abnormalities of gait and mobility: Secondary | ICD-10-CM | POA: Diagnosis present

## 2015-09-26 DIAGNOSIS — R262 Difficulty in walking, not elsewhere classified: Secondary | ICD-10-CM | POA: Diagnosis present

## 2015-09-26 DIAGNOSIS — R29898 Other symptoms and signs involving the musculoskeletal system: Secondary | ICD-10-CM | POA: Insufficient documentation

## 2015-09-26 DIAGNOSIS — M25562 Pain in left knee: Secondary | ICD-10-CM | POA: Insufficient documentation

## 2015-09-26 DIAGNOSIS — M25662 Stiffness of left knee, not elsewhere classified: Secondary | ICD-10-CM | POA: Insufficient documentation

## 2015-09-26 DIAGNOSIS — R2689 Other abnormalities of gait and mobility: Secondary | ICD-10-CM

## 2015-09-26 NOTE — Therapy (Signed)
[place], [place], [address] Phone: [phone]   Fax:  [phone]  Physical Therapy Evaluation  Patient Details  Name: [place] MRN: [id number] Date of Birth: [birth year] Referring Provider: Frederik Pear  Encounter Date: 09/26/2015      PT End of Session - 09/26/15 1016    Visit Number 1   Number of Visits 12   Date for PT Re-Evaluation 10/31/15   Authorization Type Workers Compensation   Authorization - Visit Number 1   Authorization - Number of Visits 8   PT Start Time 1015   PT Stop Time 1100   PT Time Calculation (min) 45 min   Activity Tolerance Patient tolerated treatment well   Behavior During Therapy [facility] for tasks assessed/performed      Past Medical History  Diagnosis Date  . IBS (irritable bowel syndrome)   . Asthma   . Adopted     Patient has no known family history  . Headache(784.0)   . Venous aneurysm     Right side of neck    Past Surgical History  Procedure Laterality Date  . Abdominal hysterectomy    . Breast mass excision      Bilateral breast mass excision ( Benign)    There were no vitals filed for this visit.  Visit Diagnosis:  Stiffness of knee joint, left - Plan: PT plan of care cert/re-cert  Pain in left knee - Plan: PT plan of care cert/re-cert  Weakness of left leg - Plan: PT plan of care cert/re-cert  Difficulty walking down stairs - Plan: PT plan of care cert/re-cert  Antalgic gait - Plan: PT plan of care cert/re-cert  Difficulty walking up stairs - Plan: PT plan of care cert/re-cert      Subjective Assessment - 09/26/15 1028    Subjective !0/09/2014 [name] was at work and foot caught on floor and fell on LT knee.  Initially xrays negative but was DX with [place] assessment. She was in immobilizor for 8 weeks.   She has pain , more after working weekend shift and she treats with cold at home.  Knee pops at times maybe with turning. Kneeling  ishe is  not able to do   Pertinent History MS   Limitations --  squatting and being on knees, stairs   How long can you sit comfortably? As needed   How long can you stand comfortably? As needed   How long can you walk comfortably? Not sure   Patient Stated Goals She wants LT knee to hurt less and improve strength in leg   Currently in Pain? No/denies  Pain with pressure and stairs,    Multiple Pain Sites Yes   Pain Score --  none but pain with bumping RT foot or prolonged time on feet   Pain Location Foot   Pain Orientation Right;Lateral            [facility] PT Assessment - 09/26/15 1005    Assessment   Medical Diagnosis LT patella fracture   Referring Provider Frederik Pear   Onset Date/Surgical Date 06/21/15   Hand Dominance Right   Next MD Visit 10/27/14   Prior Therapy No   Precautions   Precautions None   Restrictions   Weight Bearing Restrictions No   Balance Screen   Has the patient fallen in the past 6 months Yes   How many times? 1  Fell on knee at work   Has the patient  had a decrease in activity level because of a fear of falling?  Yes   Is the patient reluctant to leave their home because of a fear of falling?  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   Leisure Not able to walk a mile for  exercise   Cognition   Overall Cognitive Status Within Functional Limits for tasks assessed   Observation/Other Assessments   Focus on Therapeutic Outcomes (FOTO)  49% limited   Observation/Other Assessments-Edema    Edema Circumferential   Circumferential Edema   Circumferential - Right mid patella  14 inch  LT  14 inch   ROM / Strength   AROM / PROM / Strength AROM;PROM;Strength   AROM   AROM Assessment Site Knee   Right/Left Knee Right;Left   Right Knee Extension -3   Right Knee Flexion 143   Left Knee Extension -10   Left Knee Flexion 143   PROM   PROM Assessment Site Knee   Right/Left  Knee Left   Left Knee Extension -3   Strength   Strength Assessment Site Knee;Hip;Ankle   Right/Left Hip Right;Left   Right Hip Flexion 4-/5   Right Hip Extension 4-/5   Right Hip ABduction 4-/5   Left Hip Flexion 4/5   Left Hip Extension 4-/5   Left Hip ABduction 4+/5   Right/Left Knee Right;Left   Right Knee Flexion 4/5   Right Knee Extension 4+/5   Left Knee Flexion 4+/5   Left Knee Extension 4+/5   Flexibility   Soft Tissue Assessment /Muscle Length yes   Ambulation/Gait   Gait velocity 23m/sec   Gait Comments Ambulates with no device decreased weight to RT ,, decr extension with stance                           PT Education - 09/26/15 1123    Education provided Yes   Education Details POC, HEP   Person(s) Educated Patient   Methods Explanation;Tactile cues;Verbal cues;Handout   Comprehension Verbalized understanding;Returned demonstration          PT Short Term Goals - 09/26/15 1129    PT SHORT TERM GOAL #4   Title She wi;; report pain decreased 40% with walking stairs   Time 3   Period Weeks   Status New           PT Long Term Goals - 09/26/15 1014    PT LONG TERM GOAL #1   Title She will be independent with all HEP issued as of last visit.    Time 6   Period Weeks   Status New   PT LONG TERM GOAL #2   Title She will improve active flexion to 125 degrees to improve mobility with less pain   Time 6   Period Weeks   Status New   PT LONG TERM GOAL #3   Title She will walk with 50% or more less pain   Time 6   Period Weeks   Status New   PT LONG TERM GOAL #4   Title She will report able to do normal home tasks without limits due to knee pain   Time 6   Period Weeks   Status New   PT LONG TERM GOAL #5   Title She will report 75% decreased pain with stairs   Time 6   Period  Weeks   Status New   Additional Long Term Goals   Additional Long Term Goals Yes   PT LONG TERM GOAL #6   Title She will walk 1/2 to 3/4 mile to  progress to return to 1 mile walking for HEP   Time 6   Period Weeks   Status New               Plan - 09/26/15 1017    Clinical Impression Statement Ms Clemens presents with LT knee pain and weakness limiting ability to kneel and walk stairs. Her MS may be influency general LE weakness. She also has RT foot pain limiting walking distances. .She re. She shoulde do wellports poping in RT knee with movements at times.    Pt will benefit from skilled therapeutic intervention in order to improve on the following deficits Pain;Impaired flexibility;Difficulty walking;Decreased activity tolerance;Decreased range of motion;Decreased strength   Rehab Potential Good   PT Frequency 2x / week   PT Duration 6 weeks   PT Treatment/Interventions Cryotherapy;Electrical Stimulation;Iontophoresis 4mg /ml Dexamethasone;Moist Heat;Balance training;Therapeutic exercise;Gait training;Stair training;Patient/family education;Manual techniques;Passive range of motion;Taping;Vasopneumatic Device   PT Next Visit Plan HEP for ROM and strength, modalities PRN, taping   PT Home Exercise Plan LE strength   Consulted and Agree with Plan of Care Patient         Problem List Patient Active Problem List   Diagnosis Date Noted  . Fracture of fifth metatarsal bone 08/13/2015  . Patella fracture 07/03/2015  . Palpitations 06/23/2012  . Mixed hyperlipidemia 06/23/2012  . MS (multiple sclerosis) ([place]) 06/23/2012  . Venous aneurysm   . IBS (irritable bowel syndrome)   . Unspecified venous (peripheral) insufficiency 11/09/2011  . 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 Leg pain 10/26/2011    Darrel Hoover PT 09/26/2015, 11:56 AM  [facility] [address] [place], [place], [address] Phone: [phone]   Fax:  [phone]  Name: DAROLYN BERTHIAUME MRN: [id number] Date of Birth: [birth year]

## 2015-09-26 NOTE — Patient Instructions (Signed)
From cabinet issued SLR supine, side and prone, bridge, clam, quad sets with ankle DF /PF all 2-3 x/week 10-25 reps 1-3 sec holds and 5-10 sec QS holds

## 2015-09-30 ENCOUNTER — Ambulatory Visit: Payer: PRIVATE HEALTH INSURANCE | Attending: Family Medicine | Admitting: Physical Therapy

## 2015-09-30 DIAGNOSIS — R262 Difficulty in walking, not elsewhere classified: Secondary | ICD-10-CM | POA: Insufficient documentation

## 2015-09-30 DIAGNOSIS — M25662 Stiffness of left knee, not elsewhere classified: Secondary | ICD-10-CM | POA: Insufficient documentation

## 2015-09-30 DIAGNOSIS — R29898 Other symptoms and signs involving the musculoskeletal system: Secondary | ICD-10-CM | POA: Insufficient documentation

## 2015-09-30 DIAGNOSIS — M25562 Pain in left knee: Secondary | ICD-10-CM | POA: Insufficient documentation

## 2015-10-03 ENCOUNTER — Ambulatory Visit: Payer: PRIVATE HEALTH INSURANCE

## 2015-10-06 ENCOUNTER — Ambulatory Visit: Payer: PRIVATE HEALTH INSURANCE | Admitting: Physical Therapy

## 2015-10-06 DIAGNOSIS — M25562 Pain in left knee: Secondary | ICD-10-CM | POA: Diagnosis present

## 2015-10-06 DIAGNOSIS — M25662 Stiffness of left knee, not elsewhere classified: Secondary | ICD-10-CM | POA: Diagnosis not present

## 2015-10-06 DIAGNOSIS — R29898 Other symptoms and signs involving the musculoskeletal system: Secondary | ICD-10-CM | POA: Diagnosis present

## 2015-10-06 DIAGNOSIS — R262 Difficulty in walking, not elsewhere classified: Secondary | ICD-10-CM | POA: Diagnosis present

## 2015-10-06 NOTE — Patient Instructions (Addendum)
Hamstring: Towel Stretch (Supine)    Lie on back. Loop towel around left foot, hip and knee at 90. Straighten knee and pull foot toward body. Hold __30_ seconds. Relax. Repeat3 ___ times. Do 1___ times a day. Repeat with other leg.    Copyright  VHI. All rights reserved.  ADDUCTION: Side-Lying (Active)    Lie on right side, with top leg bent and in front of other leg. Lift straight leg up as high as possible.. Complete 1 to  3_ sets of _10__ repetitions. Perform __1_ sessions per day.  http://gtsc.exer.us/129   Copyright  VHI. All rights reserved.  ABDUCTION: Side-Lying (Active)    Lie on left side, top leg straight. Raise top leg as far as possible. Complete _1-3__ sets of ___ repetitions. Perform ___ sessions per day. 10Strengthening: Hamstring Set    With left foot turned in, tighten muscles on back of thigh by pulling heel down into surface. Hold _10___ seconds. Repeat _10___ times per set. Do __1 to 3__ sets per session. Do ___1-3Strengthening: Straight Leg Raise (Phase 1)    Tighten muscles on front of right thigh, then lift leg 12____ inches from surface, keeping knee locked.  Repeat __10-30__ times per set. Do 1-3____ sets per session. Do 1-3____ sessions per day.  http://orth.exer.us/615  Prone hip extension from exercise drawer.  10 X up to 3 sets 1 to 3 X a day.   Copyright  VHI. All rights reserved.  _ sessions per day.  http://orth.exer.us/605   Copyright  VHI. All rights reserved.  From exercise drawer:  Knee strength level 1 10 to 30 X each 1 to 3 X a day.  0 to 5 second holds http://gtsc.exer.us/95   Copyright  VHI. All rights reserved.

## 2015-10-06 NOTE — Therapy (Signed)
[place], [place], [address] Phone: [phone]   Fax:  [phone]  Physical Therapy Treatment  Patient Details  Name: [place] MRN: [id number] Date of Birth: 31-Jul-1961 Referring Provider: Frederik Pear  Encounter Date: 10/06/2015      PT End of Session - 10/06/15 1116    Visit Number 2   Number of Visits 12   Date for PT Re-Evaluation 10/31/15   PT Start Time 1020   PT Stop Time 1103   PT Time Calculation (min) 43 min   Activity Tolerance Patient tolerated treatment well;No increased pain   Behavior During Therapy [facility] for tasks assessed/performed      Past Medical History  Diagnosis Date  . IBS (irritable bowel syndrome)   . Asthma   . Adopted     Patient has no known family history  . Headache(784.0)   . Venous aneurysm     Right side of neck    Past Surgical History  Procedure Laterality Date  . Abdominal hysterectomy    . Breast mass excision      Bilateral breast mass excision ( Benign)    There were no vitals filed for this visit.  Visit Diagnosis:  Stiffness of knee joint, left  Weakness of left leg      Subjective Assessment - 10/06/15 1029    Subjective 2/10 knee. up to 5/10.  Patella is not healed yet.     Currently in Pain? Yes   Pain Score 2    Pain Orientation Right;Left;Upper;Lower;Lateral;Medial   Pain Descriptors / Indicators Aching   Pain Frequency Intermittent   Multiple Pain Sites Yes  RT foot.  Not treating today                         [facility] Adult PT Treatment/Exercise - 10/06/15 1036    Knee/Hip Exercises: Stretches   Passive Hamstring Stretch 3 reps;30 seconds   Knee/Hip Exercises: Supine   Quad Sets 10 reps   Short Arc Quad Sets 10 reps   Heel Slides 10 reps   Other Supine Knee/Hip Exercises isometric h amstrings 10 x 2 different angles.                PT Education - 10/06/15 1115    Education provided Yes   Education Details knee exercise,  Hip 4 way, knee level 1 strengthening. hamstring stretch.   Person(s) Educated Patient   Methods Explanation;Demonstration;Tactile cues;Verbal cues;Handout   Comprehension Verbalized understanding;Returned demonstration          PT Short Term Goals - 10/06/15 1119    PT SHORT TERM GOAL #1   Title she will be independent with intial HEP   Time 3   Period Weeks   Status On-going   PT SHORT TERM GOAL #2   Title She will improve LT knee active  flexion to 90 degrees   Baseline easily gets today   Time 3   Period Weeks   Status Achieved   PT SHORT TERM GOAL #3   Title She will report pain decreased 30% or more with normal ambulation   Time 3   Period Weeks   Status On-going   PT SHORT TERM GOAL #4   Title She wi;; report pain decreased 40% with walking stairs   Time 3   Period Weeks   Status On-going           PT Long Term Goals -  09/26/15 1014    PT LONG TERM GOAL #1   Title She will be independent with all HEP issued as of last visit.    Time 6   Period Weeks   Status New   PT LONG TERM GOAL #2   Title She will improve active flexion to 125 degrees to improve mobility with less pain   Time 6   Period Weeks   Status New   PT LONG TERM GOAL #3   Title She will walk with 50% or more less pain   Time 6   Period Weeks   Status New   PT LONG TERM GOAL #4   Title She will report able to do normal home tasks without limits due to knee pain   Time 6   Period Weeks   Status New   PT LONG TERM GOAL #5   Title She will report 75% decreased pain with stairs   Time 6   Period Weeks   Status New   Additional Long Term Goals   Additional Long Term Goals Yes   PT LONG TERM GOAL #6   Title She will walk 1/2 to 3/4 mile to progress to return to 1 mile walking for HEP   Time 6   Period Weeks   Status New               Plan - 10/06/15 1117    Clinical Impression Statement Progress toward home exercise program.  Patient is to  let pain be her guide since she reports her patella is not healed.  She reported no increased pain post session,but increased limp noted. STG#3 met.    PT Next Visit Plan review home exercise.  encourage cold post exercise?    PT Home Exercise Plan Level 1 knee, hip 4 way, hamstring stretch.   Consulted and Agree with Plan of Care Patient        Problem List Patient Active Problem List   Diagnosis Date Noted  . Fracture of fifth metatarsal bone 08/13/2015  . Patella fracture 07/03/2015  . Palpitations 06/23/2012  . Mixed hyperlipidemia 06/23/2012  . MS (multiple sclerosis) ([place]) 06/23/2012  . Venous aneurysm   . IBS (irritable bowel syndrome)   . Unspecified venous (peripheral) insufficiency 11/09/2011  . 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 Leg pain 10/26/2011    [facility] 10/06/2015, 11:21 AM  [facility] [address] [place], [place], [address] Phone: [phone]   Fax:  [phone]  Name: CARLETA WOODROW MRN: [id number] Date of Birth: [birth year]    Melvenia Needles, PTA 10/06/2015 11:21 AM Phone: [phone] Fax: [phone]

## 2015-10-08 ENCOUNTER — Ambulatory Visit: Payer: PRIVATE HEALTH INSURANCE

## 2015-10-08 DIAGNOSIS — M25662 Stiffness of left knee, not elsewhere classified: Secondary | ICD-10-CM | POA: Diagnosis not present

## 2015-10-08 DIAGNOSIS — M25562 Pain in left knee: Secondary | ICD-10-CM

## 2015-10-08 DIAGNOSIS — R262 Difficulty in walking, not elsewhere classified: Secondary | ICD-10-CM

## 2015-10-08 DIAGNOSIS — R29898 Other symptoms and signs involving the musculoskeletal system: Secondary | ICD-10-CM

## 2015-10-08 NOTE — Therapy (Signed)
[place], [place], [address] Phone: [phone]   Fax:  [phone]  Physical Therapy Treatment  Patient Details  Name: [place] MRN: [id number] Date of Birth: December 03, 1960 Referring Provider: Frederik Pear  Encounter Date: 10/08/2015      PT End of Session - 10/08/15 1108    Visit Number 3   Number of Visits 12   Date for PT Re-Evaluation 10/31/15   PT Start Time 1017   PT Stop Time 1058   PT Time Calculation (min) 41 min   Activity Tolerance Patient tolerated treatment well   Behavior During Therapy [facility] for tasks assessed/performed      Past Medical History  Diagnosis Date  . IBS (irritable bowel syndrome)   . Asthma   . Adopted     Patient has no known family history  . Headache(784.0)   . Venous aneurysm     Right side of neck    Past Surgical History  Procedure Laterality Date  . Abdominal hysterectomy    . Breast mass excision      Bilateral breast mass excision ( Benign)    There were no vitals filed for this visit.  Visit Diagnosis:  Stiffness of knee joint, left  Weakness of left leg  Pain in left knee  Difficulty walking down stairs  Difficulty walking up stairs      Subjective Assessment - 10/08/15 1024    Subjective 0/10 LT knee today. Mild pain earlier but good now.   Currently in Pain? No/denies                         [facility] Adult PT Treatment/Exercise - 10/08/15 0001    Knee/Hip Exercises: Stretches   Active Hamstring Stretch Left;3 reps;30 seconds   Knee/Hip Exercises: Seated   Long Arc Quad Strengthening;Left;15 reps   Long Arc Quad Weight 3 lbs.   Long [employer] Limitations 5 sec hold   Knee/Hip Exercises: Supine   Short Arc [employer] Left;15 reps   Short Arc [employer] Limitations 3 pounds 10 sec hold   Knee/Hip Exercises: Sidelying   Hip ABduction Strengthening;Left;15 reps   Hip ABduction Limitations 3 pounds   Hip ADduction  Left;15 reps   Other Sidelying Knee/Hip Exercises Also did prone and supine SLR  with 3 pounds x 15 and did PKF 3 pounds x 20   Manual Therapy   Manual therapy comments Gentle stretch prone knee flexion .                  PT Short Term Goals - 10/06/15 1119    PT SHORT TERM GOAL #1   Title she will be independent with intial HEP   Time 3   Period Weeks   Status On-going   PT SHORT TERM GOAL #2   Title She will improve LT knee active  flexion to 90 degrees   Baseline easily gets today   Time 3   Period Weeks   Status Achieved   PT SHORT TERM GOAL #3   Title She will report pain decreased 30% or more with normal ambulation   Time 3   Period Weeks   Status On-going   PT SHORT TERM GOAL #4   Title She wi;; report pain decreased 40% with walking stairs   Time 3   Period Weeks   Status On-going           PT Long  Term Goals - 09/26/15 1014    PT LONG TERM GOAL #1   Title She will be independent with all HEP issued as of last visit.    Time 6   Period Weeks   Status New   PT LONG TERM GOAL #2   Title She will improve active flexion to 125 degrees to improve mobility with less pain   Time 6   Period Weeks   Status New   PT LONG TERM GOAL #3   Title She will walk with 50% or more less pain   Time 6   Period Weeks   Status New   PT LONG TERM GOAL #4   Title She will report able to do normal home tasks without limits due to knee pain   Time 6   Period Weeks   Status New   PT LONG TERM GOAL #5   Title She will report 75% decreased pain with stairs   Time 6   Period Weeks   Status New   Additional Long Term Goals   Additional Long Term Goals Yes   PT LONG TERM GOAL #6   Title She will walk 1/2 to 3/4 mile to progress to return to 1 mile walking for HEP   Time 6   Period Weeks   Status New               Plan - 10/08/15 1109    Clinical Impression Statement No pain at end. Mild tight feeling medial LT knee . PROM LT knee in prone was less stiff  than RT.    PT Next Visit Plan Continue with strength incr to 5 pounds if tolerated then the following session start closed chain exercises if no pain from 5 pounds  with exercise. Cold if needed   Consulted and Agree with Plan of Care Patient        Problem List Patient Active Problem List   Diagnosis Date Noted  . Fracture of fifth metatarsal bone 08/13/2015  . Patella fracture 07/03/2015  . Palpitations 06/23/2012  . Mixed hyperlipidemia 06/23/2012  . MS (multiple sclerosis) ([place]) 06/23/2012  . Venous aneurysm   . IBS (irritable bowel syndrome)   . Unspecified venous (peripheral) insufficiency 11/09/2011  . Aneurysm of artery of neck ([employer]) 10/26/2011  . Leg pain 10/26/2011    Darrel Hoover PT 10/08/2015, 11:11 AM  [facility] [address] [place], [place], [address] Phone: [phone]   Fax:  [phone]  Name: [place] Nichols MRN: [id number] Date of Birth: [birth year]

## 2015-10-13 ENCOUNTER — Ambulatory Visit: Payer: PRIVATE HEALTH INSURANCE

## 2015-10-16 ENCOUNTER — Ambulatory Visit: Payer: PRIVATE HEALTH INSURANCE

## 2015-10-20 ENCOUNTER — Ambulatory Visit: Payer: PRIVATE HEALTH INSURANCE

## 2015-10-20 DIAGNOSIS — M25562 Pain in left knee: Secondary | ICD-10-CM

## 2015-10-20 DIAGNOSIS — M25662 Stiffness of left knee, not elsewhere classified: Secondary | ICD-10-CM

## 2015-10-20 DIAGNOSIS — R29898 Other symptoms and signs involving the musculoskeletal system: Secondary | ICD-10-CM

## 2015-10-20 DIAGNOSIS — R262 Difficulty in walking, not elsewhere classified: Secondary | ICD-10-CM

## 2015-10-20 NOTE — Patient Instructions (Signed)
Quadriceps Stretch    Reach bending knee and pulling knee inline with hip and drop knee toward bed.  You can have someone help you to make it easier. They can sit behind your  Hips. . Repeat on other side. Repeat 2-3___ times, alternating sides. Do _2__ times per day.  Copyright  VHI. All rights reserved.                                                                                                                                                                                                Lye on back and pull LT knee toward RT shoulder 30 sec or more 2-3x     2 x/day  RT leg flat on bed.

## 2015-10-20 NOTE — Therapy (Signed)
[place], [place], [address] Phone: [phone]   Fax:  [phone]  Physical Therapy Treatment  Patient Details  Name: Cynthia Nichols MRN: [id number] Date of Birth: 10/19/1960 Referring Provider: Frederik Pear  Encounter Date: 10/20/2015      PT End of Session - 10/20/15 1126    Visit Number 4   Number of Visits 12   Date for PT Re-Evaluation 10/31/15   PT Start Time [id number]   PT Stop Time 1100   PT Time Calculation (min) 45 min   Activity Tolerance Patient tolerated treatment well   Behavior During Therapy [facility] for tasks assessed/performed      Past Medical History  Diagnosis Date  . IBS (irritable bowel syndrome)   . Asthma   . Adopted     Patient has no known family history  . Headache(784.0)   . Venous aneurysm     Right side of neck    Past Surgical History  Procedure Laterality Date  . Abdominal hysterectomy    . Breast mass excision      Bilateral breast mass excision ( Benign)    There were no vitals filed for this visit.  Visit Diagnosis:  Stiffness of knee joint, left  Weakness of left leg  Pain in left knee  Difficulty walking down stairs  Difficulty walking up stairs      Subjective Assessment - 10/20/15 1018    Subjective LT knee sore from working all weekend. Doid not ice either day   Currently in Pain? Yes   Pain Score 2    Pain Location Knee   Pain Orientation Upper;Lower;Medial;Anterior;Lateral   Pain Descriptors / Indicators Aching   Pain Onset More than a month ago   Pain Frequency Intermittent   Aggravating Factors  working on feet.   Multiple Pain Sites Yes                         [facility] Adult PT Treatment/Exercise - 10/20/15 1031    Knee/Hip Exercises: Standing   Forward Lunges Left;15 reps   Forward Lunges Limitations in bars 2 hand hold   Knee/Hip Exercises: Seated   Long Arc Quad Left;20 reps   Long Arc Quad Weight 7 lbs.   Long  [employer] Limitations 5 sec hold   Other Seated Knee/Hip Exercises bike L3 6 min.     Knee/Hip Exercises: Sidelying   Hip ABduction Strengthening;Left;20 reps   Hip ABduction Limitations 3 pounds   Hip ADduction Left;15 reps   Knee/Hip Exercises: Prone   Hamstring Curl 1 set;20 reps;1 second   Hamstring Curl Limitations 7 pounds   Straight Leg Raises Left;15 reps   Manual Therapy   Manual Therapy Passive ROM   Passive ROM OBERs position stretching x 30 sec x 3   and pt stretch flexion adduction LT hip.    30 sec x 2       Self care: stretching and STW , use of ice         PT Education - 10/20/15 1126    Education provided Yes   Education Details Stretching  LT hip and use of ball or roller for STW   Person(s) Educated Patient   Methods Explanation;Demonstration;Handout;Verbal cues;Tactile cues   Comprehension Verbalized understanding;Returned demonstration          PT Short Term Goals - 10/20/15 1129    PT SHORT TERM GOAL #1   Title she will  be independent with intial HEP   Status Achieved   PT SHORT TERM GOAL #2   Title She will improve LT knee active  flexion to 90 degrees   Status Achieved   PT SHORT TERM GOAL #3   Title She will report pain decreased 30% or more with normal ambulation   Status Achieved   PT SHORT TERM GOAL #4   Title She wi;; report pain decreased 40% with walking stairs   Status On-going           PT Long Term Goals - 10/20/15 1129    PT LONG TERM GOAL #1   Title She will be independent with all HEP issued as of last visit.    Status On-going   PT LONG TERM GOAL #2   Title She will improve active flexion to 125 degrees to improve mobility with less pain   Status On-going   PT LONG TERM GOAL #3   Title She will walk with 50% or more less pain   Status Achieved   PT LONG TERM GOAL #4   Title She will report able to do normal home tasks without limits due to knee pain   Status On-going   PT LONG TERM GOAL #5   Title She will report  75% decreased pain with stairs   Status On-going   PT LONG TERM GOAL #6   Title She will walk 1/2 to 3/4 mile to progress to return to 1 mile walking for HEP   Status On-going               Plan - 10/20/15 1127    Clinical Impression Statement Exercise did not increase pain /soreness LT knee. I suggested ic e packs and stretching , STW at home to see if this helps with soreness and tight feeling in LT hip.    PT Next Visit Plan Continue strength with closed chain and weights for strength.  Measure prone flexion LT knee compared to RT.    Consulted and Agree with Plan of Care Patient        Problem List Patient Active Problem List   Diagnosis Date Noted  . Fracture of fifth metatarsal bone 08/13/2015  . Patella fracture 07/03/2015  . Palpitations 06/23/2012  . Mixed hyperlipidemia 06/23/2012  . MS (multiple sclerosis) ([place]) 06/23/2012  . Venous aneurysm   . IBS (irritable bowel syndrome)   . Unspecified venous (peripheral) insufficiency 11/09/2011  . 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 Leg pain 10/26/2011    Darrel Hoover PT 10/20/2015, 11:30 AM  [facility] [address] [place], [place], [address] Phone: [phone]   Fax:  [phone]  Name: Cynthia Nichols MRN: [id number] Date of Birth: 1961/07/20

## 2015-10-22 ENCOUNTER — Ambulatory Visit: Payer: PRIVATE HEALTH INSURANCE | Attending: Orthopedic Surgery | Admitting: Physical Therapy

## 2015-10-22 DIAGNOSIS — R269 Unspecified abnormalities of gait and mobility: Secondary | ICD-10-CM | POA: Insufficient documentation

## 2015-10-22 DIAGNOSIS — R262 Difficulty in walking, not elsewhere classified: Secondary | ICD-10-CM | POA: Insufficient documentation

## 2015-10-22 DIAGNOSIS — M25662 Stiffness of left knee, not elsewhere classified: Secondary | ICD-10-CM | POA: Insufficient documentation

## 2015-10-22 DIAGNOSIS — R2689 Other abnormalities of gait and mobility: Secondary | ICD-10-CM

## 2015-10-22 DIAGNOSIS — M25562 Pain in left knee: Secondary | ICD-10-CM | POA: Insufficient documentation

## 2015-10-22 DIAGNOSIS — R29898 Other symptoms and signs involving the musculoskeletal system: Secondary | ICD-10-CM | POA: Insufficient documentation

## 2015-10-22 NOTE — Patient Instructions (Addendum)
From exercise drawer:  Strengthening hip Muscles  JOSPT 3 to 10 X each 3 to 4 X a week.  Quadriped from elbows SLR, bridges, mini squat with side steps and side lying clams.

## 2015-10-22 NOTE — Therapy (Signed)
[facility] [address] [place], [place], [address] Phone: [phone]   Fax:  [phone]  Physical Therapy Treatment  Patient Details  Name: Cynthia Nichols MRN: [id number] Date of Birth: Jan 04, 1961 Referring Provider: Gean Birchwood  Encounter Date: 10/22/2015      PT End of Session - 10/22/15 1211    Visit Number 5   Number of Visits 12   Date for PT Re-Evaluation 10/31/15   PT Start Time 0935   PT Stop Time 1017   PT Time Calculation (min) 42 min   Activity Tolerance Patient tolerated treatment well   Behavior During Therapy [facility] for tasks assessed/performed      Past Medical History  Diagnosis Date  . IBS (irritable bowel syndrome)   . Asthma   . Adopted     Patient has no known family history  . Headache(784.0)   . Venous aneurysm     Right side of neck    Past Surgical History  Procedure Laterality Date  . Abdominal hysterectomy    . Breast mass excision      Bilateral breast mass excision ( Benign)    There were no vitals filed for this visit.  Visit Diagnosis:  Stiffness of knee joint, left  Weakness of left leg  Pain in left knee  Difficulty walking down stairs  Difficulty walking up stairs  Antalgic gait      Subjective Assessment - 10/22/15 0934    Subjective 3/10.  She can walk most every day 1 mile with pain 1 to 3/10.   Pain with walking 95% improved,  Pain on steps 50% improved.    Has problems getting off couch after sitting awhile.   Stiff first thing in am.  Loosesn after walking a while.             [facility] PT Assessment - 10/22/15 0001    AROM   Right Knee Flexion --  110 degrees prone knee flexion AROM   Left Knee Flexion --  114 degrees prone  AROM                     [facility] Adult PT Treatment/Exercise - 10/22/15 0951    Knee/Hip Exercises: Stretches   Passive Hamstring Stretch 3 reps;30 seconds  each   Piriformis Stretch 1 rep;20 seconds   Piriformis  Stretch Limitations stiff   Other Knee/Hip Stretches single knee to chest 2 X 10 seconds   Knee/Hip Exercises: Aerobic   Stationary Bike 8 minutes L2   Knee/Hip Exercises: Standing   Side Lunges --  3 sets, 2 steps with mini squat, no bands, difficult   Forward Step Up Both;1 set;10 reps;Hand Hold: 2;Step Height: 6"   Other Standing Knee Exercises hip hinge 5 X cues.     Knee/Hip Exercises: Supine   Bridges Limitations 10, both   Knee/Hip Exercises: Sidelying   Clams RT no band best,  LT yellow band 10 X tactile,  for technique   Knee/Hip Exercises: Prone   Hamstring Curl 1 second;20 reps   Hamstring Curl Limitations 7 LBS   Straight Leg Raises 10 reps  resting on knees and elbows(modified quadriped)   Straight Leg Raises Limitations difficult, cues neutral spine   Manual Therapy   Manual therapy comments retrograde  knee flexion more comfortable after manual                PT Education - 10/22/15 1209    Education provided Yes  Education Details hip strengthening   Person(s) Educated Patient   Methods Explanation;Demonstration;Tactile cues;Verbal cues;Handout   Comprehension Verbalized understanding;Returned demonstration          PT Short Term Goals - 10/22/15 1216    PT SHORT TERM GOAL #1   Title she will be independent with intial HEP   Time 3   Period Weeks   Status Achieved   PT SHORT TERM GOAL #2   Title She will improve LT knee active  flexion to 90 degrees   Time 3   Period Weeks   Status Achieved   PT SHORT TERM GOAL #3   Title She will report pain decreased 30% or more with normal ambulation   Time 3   Period Weeks   Status Achieved   PT SHORT TERM GOAL #4   Title She wi;; report pain decreased 40% with walking stairs   Baseline 50%   Time 3   Period Weeks   Status Achieved           PT Long Term Goals - 10/22/15 1216    PT LONG TERM GOAL #1   Title She will be independent with all HEP issued as of last visit.    Time 6   Period  Weeks   Status On-going   PT LONG TERM GOAL #2   Title She will improve active flexion to 125 degrees to improve mobility with less pain   Time 6   Period Weeks   Status Unable to assess   PT LONG TERM GOAL #3   Title She will walk with 50% or more less pain   Baseline 95%   Time 6   Period Weeks   Status Achieved   PT LONG TERM GOAL #4   Title She will report able to do normal home tasks without limits due to knee pain   Baseline limits some 3/10 able to pace herself   Time 6   Period Weeks   Status On-going   PT LONG TERM GOAL #5   Title She will report 75% decreased pain with stairs   Baseline 50%   Time 6   Period Weeks   Status On-going   PT LONG TERM GOAL #6   Title She will walk 1/2 to 3/4 mile to progress to return to 1 mile walking for HEP   Baseline 1 mile almost daily   Time 6   Period Weeks   Status Achieved               Plan - 10/22/15 1212    Clinical Impression Statement Walking goal met Pain goal partially met.  Pair worst at the end of her weekend shift taking the whole dsy Monday to recover.  Hips are functionally weak in addition to being tight.  Knee pain inproving with closed chain exercises , but still present.    PT Next Visit Plan heat hamstrings, hip flexor and stretch,  Soft tissue work?  review hip strengthening   PT Home Exercise Plan JOSPT hip strength series   Consulted and Agree with Plan of Care Patient        Problem List Patient Active Problem List   Diagnosis Date Noted  . Fracture of fifth metatarsal bone 08/13/2015  . Patella fracture 07/03/2015  . Palpitations 06/23/2012  . Mixed hyperlipidemia 06/23/2012  . MS (multiple sclerosis) ([place]) 06/23/2012  . Venous aneurysm   . IBS (irritable bowel syndrome)   . Unspecified venous (peripheral) insufficiency 11/09/2011  .  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 Leg pain 10/26/2011    [facility] 10/22/2015, 12:20 PM  [facility] [address] [place], [place], [address] Phone: [phone]   Fax:  [phone]  Name: ELLYSSA ZAGAL MRN: [id number] Date of Birth: January 12, 1961    Melvenia Needles, PTA 10/22/2015 12:20 PM Phone: [phone] Fax: [phone]

## 2015-10-23 ENCOUNTER — Ambulatory Visit (INDEPENDENT_AMBULATORY_CARE_PROVIDER_SITE_OTHER): Payer: BLUE CROSS/BLUE SHIELD | Admitting: Family Medicine

## 2015-10-23 ENCOUNTER — Encounter: Payer: Self-pay | Admitting: Family Medicine

## 2015-10-23 VITALS — BP 110/72 | HR 82 | Ht 67.0 in | Wt 149.0 lb

## 2015-10-23 DIAGNOSIS — S92301A Fracture of unspecified metatarsal bone(s), right foot, initial encounter for closed fracture: Secondary | ICD-10-CM

## 2015-10-23 DIAGNOSIS — M9905 Segmental and somatic dysfunction of pelvic region: Secondary | ICD-10-CM

## 2015-10-23 DIAGNOSIS — M999 Biomechanical lesion, unspecified: Secondary | ICD-10-CM

## 2015-10-23 DIAGNOSIS — S82002D Unspecified fracture of left patella, subsequent encounter for closed fracture with routine healing: Secondary | ICD-10-CM

## 2015-10-23 DIAGNOSIS — M533 Sacrococcygeal disorders, not elsewhere classified: Secondary | ICD-10-CM | POA: Diagnosis not present

## 2015-10-23 DIAGNOSIS — M9904 Segmental and somatic dysfunction of sacral region: Secondary | ICD-10-CM

## 2015-10-23 DIAGNOSIS — M9903 Segmental and somatic dysfunction of lumbar region: Secondary | ICD-10-CM

## 2015-10-23 HISTORY — DX: Sacrococcygeal disorders, not elsewhere classified: M53.3

## 2015-10-23 MED ORDER — ALENDRONATE SODIUM 70 MG PO TABS: 70.0000 mg | ORAL_TABLET | ORAL | Status: DC

## 2015-10-23 MED ORDER — VITAMIN D (ERGOCALCIFEROL) 1.25 MG (50000 UNIT) PO CAPS: 50000.0000 [IU] | ORAL_CAPSULE | ORAL | Status: DC

## 2015-10-23 NOTE — Assessment & Plan Note (Signed)
Decision today to treat with OMT was based on Physical Exam  After verbal consent patient was treated with HVLA, ME, FPR techniques in thoracic, lumbar, sacral and iliac areas  Patient tolerated the procedure well with improvement in symptoms  Patient given exercises, stretches and lifestyle modifications  See medications in patient instructions if given  Patient will follow up in 3 weeks

## 2015-10-23 NOTE — Assessment & Plan Note (Signed)
I believe the patient has healed overall. Encourage her to continue with the formal physical therapy and the importance of strengthening the vastus medialis oblique. Patient had a make changes and come back and see me again in 3-4 weeks.

## 2015-10-23 NOTE — Progress Notes (Signed)
[name] [place], [place] [address] Phone: [phone] Subjective:     CC: Right foot pain and knee pain  [id number] Cynthia Nichols is a 55 y.o. female coming in with complaint of foot pain. Patient was seen previously and did have a fracture of the fifth phalanx and metatarsal. Patient had been Cam Walker and was going to start transitioning into a shoe. Was having some difficulty we started her on high-dose vitamin D supplementation as well as Fosamax. Patient states the foot pain is very minimal. Mild limping at the end of the day. Denies any numbness. States that there is no swelling. Significant better than previous exam.  Patient does have a patellar fracture that is slowly healing and patient has following up with a Workmen's Comp. physician. Patient is now in physical therapy working on the weakness and stiffness of the knee. Patient states having some discomfort with her starting physical therapy at this point. States that it is more of a stiffness as well as a weakness. Trying to make progress overall. Working hard. Doesn't stop her from daily activities.  Has noticed though that her hips have been significantly tighter. Think that secondary to compensation. Has been working on trying to do some range of motion in physical therapy. Wondering if there is anything else that can be done. No radiation from the back, no numbness down the legs. Minimal back pain overall she states.  Past Medical History  Diagnosis Date  . IBS (irritable bowel syndrome)   . Asthma   . Adopted     Patient has no known family history  . Headache(784.0)   . Venous aneurysm     Right side of neck   Past Surgical History  Procedure Laterality Date  . Abdominal hysterectomy    . Breast mass excision      Bilateral breast mass excision ( Benign)   Social History  Substance Use Topics  . Smoking status: Never Smoker   . Smokeless tobacco: Never Used  .  Alcohol Use: No   Allergies  Allergen Reactions  . Imitrex [Sumatriptan]    No family history on file. no family history that is pertinent to chief complaint    Past medical history, social, surgical and family history all reviewed in electronic medical record.   Review of Systems: No headache, visual changes, nausea, vomiting, diarrhea, constipation, dizziness, abdominal pain, skin rash, fevers, chills, night sweats, weight loss, swollen lymph nodes, body aches, joint swelling, muscle aches, chest pain, shortness of breath, mood changes.   Objective Blood pressure 110/72, pulse 82, height 5\' 7"  (1.702 m), weight 149 lb (67.586 kg), SpO2 96 %.  General: No apparent distress alert and oriented x3 mood and affect normal, dressed appropriately.  HEENT: Pupils equal, extraocular movements intact  Respiratory: Patient's speak in full sentences and does not appear short of breath  Cardiovascular: No lower extremity edema, non tender, no erythema  Skin: Warm dry intact with no signs of infection or rash on extremities or on axial skeleton.  Abdomen: Soft nontender  Neuro: Cranial nerves II through XII are intact, neurovascularly intact in all extremities with 2+ DTRs and 2+ pulses.  Lymph: No lymphadenopathy of posterior or anterior cervical chain or axillae bilaterally.  Gait normal with good balance and coordination.  MSK:  Non tender with full range of motion and good stability and symmetric strength and tone of shoulders, elbows, wrist, hip, and ankles bilaterally.  Knee: Left Normal  to inspection with no erythema or effusion or obvious bony abnormalities. Mild tenderness to palpation over the patella but some mild improvement. ROM full in flexion and extension and lower leg rotation. Ligaments with solid consistent endpoints including ACL, PCL, LCL, MCL. Negative Mcmurray's, Apley's, and Thessalonian tests. Mild painful patellar compression. Still there. The hamstrings. Patellar glide  without crepitus. Patellar and quadriceps tendons unremarkable. Hamstring and quadriceps strength is normal.  contralateral knee unremarkable No Change from previous exam Right foot exam shows the patient does not have any swelling at this time. Minimal tenderness over the fifth metatarsal. No numbness. Full range of motion of the ankle and foot. Good capillary refill.  Patient's hip girdle is significant a Tye with tight hip flexors as well as tight hamstrings.  Osteopathic findings  T3 extended rotated and side bent right T8 extended rotated and side bent left L2 flexed rotated inside that right Sacrum left on left Right posterior illium        Impression and Recommendations:     This case required medical decision making of moderate complexity.

## 2015-10-23 NOTE — Progress Notes (Signed)
Pre visit review using our clinic review tool, if applicable. No additional management support is needed unless otherwise documented below in the visit note. 

## 2015-10-23 NOTE — Assessment & Plan Note (Signed)
Likely secondary to help patient has been compensating for the discomfort from her knee as well as her foot. This is a new finding. Patient did respond well to osteopathic manipulation. Patient started doing enough exercise at this time but at some point in the near future we give her some home exercises to work on core strength and hip abductors. Patient will come back again in 3 weeks. At that time I do think manipulation as long as he continues to be helpful we will be a little more aggressive.

## 2015-10-23 NOTE — Assessment & Plan Note (Signed)
Patient is doing much better with very minimal symptoms left. I do think the patient should continue with the vitamin D as well as the Fosamax for one more month. We discussed continuing to wear good shoes and avoid being barefoot. Patient will come back and see me again in one month for further evaluation and at that time we likely can resume regular activities.

## 2015-10-23 NOTE — Patient Instructions (Signed)
Good to see you  One more month of the fosamax and the vitamin D to make sure foot will be great  OK no restrictions Continue with PT and the knee For the hip and back I do think that manipulation will be helpful and we will get you to get the movement back again  See me again in 3 weeks.

## 2015-10-27 ENCOUNTER — Ambulatory Visit: Payer: PRIVATE HEALTH INSURANCE | Admitting: Physical Therapy

## 2015-10-27 DIAGNOSIS — R2689 Other abnormalities of gait and mobility: Secondary | ICD-10-CM

## 2015-10-27 DIAGNOSIS — R29898 Other symptoms and signs involving the musculoskeletal system: Secondary | ICD-10-CM

## 2015-10-27 DIAGNOSIS — M25562 Pain in left knee: Secondary | ICD-10-CM

## 2015-10-27 DIAGNOSIS — M25662 Stiffness of left knee, not elsewhere classified: Secondary | ICD-10-CM | POA: Diagnosis not present

## 2015-10-27 DIAGNOSIS — R262 Difficulty in walking, not elsewhere classified: Secondary | ICD-10-CM

## 2015-10-27 NOTE — Therapy (Addendum)
[place], [place], [address] Phone: [phone]   Fax:  [phone]  Physical Therapy Treatment  Patient Details  Name: Cynthia Nichols MRN: [id number] Date of Birth: 1961-08-01 Referring Provider: Frederik Pear  Encounter Date: 10/27/2015      PT End of Session - 10/27/15 1322    Visit Number 6   Date for PT Re-Evaluation 10/31/15   PT Start Time 1104   PT Stop Time 1147   PT Time Calculation (min) 43 min   Activity Tolerance Patient tolerated treatment well   Behavior During Therapy [facility] for tasks assessed/performed      Past Medical History  Diagnosis Date  . IBS (irritable bowel syndrome)   . Asthma   . Adopted     Patient has no known family history  . Headache(784.0)   . Venous aneurysm     Right side of neck    Past Surgical History  Procedure Laterality Date  . Abdominal hysterectomy    . Breast mass excision      Bilateral breast mass excision ( Benign)    There were no vitals filed for this visit.  Visit Diagnosis:  Stiffness of knee joint, left  Weakness of left leg  Pain in left knee  Difficulty walking down stairs  Difficulty walking up stairs  Antalgic gait      Subjective Assessment - 10/27/15 1110    Subjective Saw DR. Tamala Julian he said I needed a heel lift in RT shoe to equal my leg lengths.  Hips are sore and stiff.  Stairs and walking are hard to do at the end of the day.     Pain Score 2   Pain Location Hip   Pain Orientation Right;Left;Lateral                         [facility] Adult PT Treatment/Exercise - 10/27/15 0001    Ambulation/Gait   Gait Comments Heel lift RT per Dr Tamala Julian per patient   Knee/Hip Exercises: Stretches   Passive Hamstring Stretch 3 reps;30 seconds  both   Other Knee/Hip Stretches hip IR 2 reps 25-30, PROM,  sore LT hip.  each side   Knee/Hip Exercises: Machines for Strengthening   Hip Cybex extension1.5 plates 10 X,  Abduction 10 X 1 plate 10 X   Knee/Hip Exercises: Standing   Heel Raises Limitations tip toe walking 25 feet   Lateral Step Up Both;1 set;10 reps;Hand Hold: 1;Step Height: 6"   Forward Step Up Both;10 reps   Wall Squat 10 reps  fatigues quickly   Knee/Hip Exercises: Supine   Bridges Limitations 10  single 5 X   Knee/Hip Exercises: Sidelying   Clams 10 X Yellow , now able to use band RT too.    Knee/Hip Exercises: Prone   Hamstring Curl 1 set;10 reps  pause with lowering, 7.5 LBS, towel at distal quads.   Straight Leg Raises 10 reps                  PT Short Term Goals - 10/22/15 1216    PT SHORT TERM GOAL #1   Title she will be independent with intial HEP   Time 3   Period Weeks   Status Achieved   PT SHORT TERM GOAL #2   Title She will improve LT knee active  flexion to 90 degrees   Time 3   Period Weeks   Status Achieved  PT SHORT TERM GOAL #3   Title She will report pain decreased 30% or more with normal ambulation   Time 3   Period Weeks   Status Achieved   PT SHORT TERM GOAL #4   Title She wi;; report pain decreased 40% with walking stairs   Baseline 50%   Time 3   Period Weeks   Status Achieved           PT Long Term Goals - 10/27/15 1324    PT LONG TERM GOAL #1   Title She will be independent with all HEP issued as of last visit.    Time 6   Period Weeks   Status On-going   PT LONG TERM GOAL #2   Title She will improve active flexion to 125 degrees to improve mobility with less pain   Time 6   Period Weeks   Status Unable to assess   PT LONG TERM GOAL #3   Title She will walk with 50% or more less pain   Status Achieved   PT LONG TERM GOAL #4   Title She will report able to do normal home tasks without limits due to knee pain   Time 6   Status On-going   PT LONG TERM GOAL #5   Title She will report 75% decreased pain with stairs   Time 6   Period Weeks   Status On-going   PT LONG TERM GOAL #6   Title She will walk 1/2 to 3/4  mile to progress to return to 1 mile walking for HEP   Time 6   Period Weeks   Status Achieved               Plan - 10/27/15 1323    Clinical Impression Statement Hip RT stronger,  now able to tolerate yellow band, last week not able.   PT Next Visit Plan assess heel lift, terminal knee extension to home , bands and prone    PT Home Exercise Plan continue   Consulted and Agree with Plan of Care Patient        Problem List Patient Active Problem List   Diagnosis Date Noted  . 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  . Nonallopathic lesion of sacral region 10/23/2015  . Nonallopathic lesion of pelvic region 10/23/2015  . Nonallopathic lesion of lumbosacral region 10/23/2015  . Fracture of fifth metatarsal bone 08/13/2015  . Patella fracture 07/03/2015  . Palpitations 06/23/2012  . Mixed hyperlipidemia 06/23/2012  . MS (multiple sclerosis) ([place]) 06/23/2012  . Venous aneurysm   . IBS (irritable bowel syndrome)   . Unspecified venous (peripheral) insufficiency 11/09/2011  . 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 Leg pain 10/26/2011    Suzzane Quilter 10/27/2015, 1:28 PM  [facility] [address] [place], [place], [address] Phone: [phone]   Fax:  [phone]  Name: Cynthia Nichols MRN: [id number] Date of Birth: Jan 19, 1961    Melvenia Needles, PTA 10/27/2015 1:28 PM Phone: [phone] Fax: [phone] PHYSICAL THERAPY DISCHARGE SUMMARY  Visits from Start of Care: 6  Current functional level related to goals / functional outcomes: Unknown . See above at last visit   Remaining deficits: Unknown   Education / Equipment: HEP/Heel lift Plan:  Patient goals were partially met. Patient is being discharged due to not returning since the last visit.  ?????   Darrel Hoover, PT   12/15/15    9:32 AM

## 2015-10-27 NOTE — Patient Instructions (Signed)
Remove heel lift if it bothers you.  You may have to ease time wearing and gradually increase time worn.

## 2015-10-29 ENCOUNTER — Other Ambulatory Visit (HOSPITAL_COMMUNITY): Payer: Self-pay | Admitting: Orthopedic Surgery

## 2015-10-29 DIAGNOSIS — S82002A Unspecified fracture of left patella, initial encounter for closed fracture: Secondary | ICD-10-CM

## 2015-11-12 ENCOUNTER — Ambulatory Visit (HOSPITAL_COMMUNITY)
Admission: RE | Admit: 2015-11-12 | Discharge: 2015-11-12 | Disposition: A | Discharge status: Home / Self Care | Payer: PRIVATE HEALTH INSURANCE | Source: Ambulatory Visit | Attending: Orthopedic Surgery | Admitting: Orthopedic Surgery

## 2015-11-12 DIAGNOSIS — M25562 Pain in left knee: Secondary | ICD-10-CM | POA: Diagnosis not present

## 2015-11-12 DIAGNOSIS — S82002A Unspecified fracture of left patella, initial encounter for closed fracture: Secondary | ICD-10-CM

## 2015-11-12 DIAGNOSIS — W19XXXA Unspecified fall, initial encounter: Secondary | ICD-10-CM | POA: Diagnosis not present

## 2015-11-12 DIAGNOSIS — R937 Abnormal findings on diagnostic imaging of other parts of musculoskeletal system: Secondary | ICD-10-CM | POA: Insufficient documentation

## 2015-11-12 DIAGNOSIS — M1712 Unilateral primary osteoarthritis, left knee: Secondary | ICD-10-CM | POA: Insufficient documentation

## 2015-11-13 ENCOUNTER — Ambulatory Visit: Payer: Self-pay | Admitting: Family Medicine

## 2015-11-21 ENCOUNTER — Other Ambulatory Visit: Payer: Self-pay | Admitting: Family Medicine

## 2015-12-08 ENCOUNTER — Ambulatory Visit (INDEPENDENT_AMBULATORY_CARE_PROVIDER_SITE_OTHER): Payer: BLUE CROSS/BLUE SHIELD | Admitting: Family Medicine

## 2015-12-08 VITALS — BP 132/82 | HR 82

## 2015-12-08 DIAGNOSIS — M533 Sacrococcygeal disorders, not elsewhere classified: Secondary | ICD-10-CM | POA: Diagnosis not present

## 2015-12-08 DIAGNOSIS — S82002D Unspecified fracture of left patella, subsequent encounter for closed fracture with routine healing: Secondary | ICD-10-CM

## 2015-12-08 DIAGNOSIS — S92301A Fracture of unspecified metatarsal bone(s), right foot, initial encounter for closed fracture: Secondary | ICD-10-CM | POA: Diagnosis not present

## 2015-12-08 DIAGNOSIS — R059 Cough, unspecified: Secondary | ICD-10-CM

## 2015-12-08 DIAGNOSIS — R05 Cough: Secondary | ICD-10-CM | POA: Diagnosis not present

## 2015-12-08 DIAGNOSIS — J069 Acute upper respiratory infection, unspecified: Secondary | ICD-10-CM

## 2015-12-08 MED ORDER — PHENYLEPHRINE-DM-GG 10-30-200 MG/5ML PO LIQD: 5.0000 mL | Freq: Three times a day (TID) | ORAL | Status: DC | PRN

## 2015-12-08 MED ORDER — AMOXICILLIN-POT CLAVULANATE 875-125 MG PO TABS: 1.0000 | ORAL_TABLET | Freq: Two times a day (BID) | ORAL | Status: DC

## 2015-12-08 MED ORDER — METHYLPREDNISOLONE ACETATE 80 MG/ML IJ SUSP
80.0000 mg | Freq: Once | INTRAMUSCULAR | Status: AC
  Administered 2015-12-08: 80 mg via INTRAMUSCULAR

## 2015-12-08 MED ORDER — ALBUTEROL SULFATE HFA 108 (90 BASE) MCG/ACT IN AERS: 2.0000 | INHALATION_SPRAY | RESPIRATORY_TRACT | Status: DC | PRN

## 2015-12-08 NOTE — Assessment & Plan Note (Signed)
Healing well at this time. No changes.

## 2015-12-08 NOTE — Assessment & Plan Note (Signed)
Seen another provider. No changes necessary. Seems to be healing well. Encourage patient to continue the vitamin D 4000 units daily for another 2 weeks in 2000 units daily thereafter.

## 2015-12-08 NOTE — Assessment & Plan Note (Signed)
Patient does not appear to be feeling well. With patient's history of multiple sclerosis as well as significant tenderness of the frontal sinuses I'm concerned the patient has a sinus infection. No focal findings in the chest at this time but seems to be worsening. Patient given Augmentin. We discussed cough syrup as well. Patient will try these different changes and come back and see me again in 2 weeks. Nose if she has any worsening with breathing she should seek medical attention. Prescription for albuterol given as well.

## 2015-12-08 NOTE — Progress Notes (Signed)
[name] [address] [place], [place] [address] Phone: [phone] Subjective:     CC: Right foot pain and knee pain, Low back pain follow-up [id number] Cynthia Nichols is a 55 y.o. female coming in with complaint of foot pain. Patient was seen previously and did have a fracture of the fifth phalanx and metatarsal. Patient is doing better overall. Back in regular shoes. Still some mild discomfort when walking for long distances.  Patient does have a patellar fracture that is slowly healing and patient has following up with a Workmen's Comp. physician. Patient had MRI and outside facility that did show good callus formation over the bone. No gapping noted. Overall still some soreness but making improvement..  Patient did respond well to osteopathic manipulation again for sacroiliac joint. Patient was hoping to have this done again.  Patient is also recently had some difficult he with a cough. Has had positive sick contact recently. States that she is having difficult even catching her breath sometimes. States that the cough seems to be continuous. We can her up at night. Been going on for greater than 2-3 weeks. Significant amount of sinus congestion and pain.    Past Medical History  Diagnosis Date  . IBS (irritable bowel syndrome)   . Asthma   . Adopted     Patient has no known family history  . Headache(784.0)   . Venous aneurysm     Right side of neck   Past Surgical History  Procedure Laterality Date  . Abdominal hysterectomy    . Breast mass excision      Bilateral breast mass excision ( Benign)   Social History  Substance Use Topics  . Smoking status: Never Smoker   . Smokeless tobacco: Never Used  . Alcohol Use: No   Allergies  Allergen Reactions  . Imitrex [Sumatriptan]    No family history on file. no family history that is pertinent to chief complaint    Past medical history, social, surgical and family history all  reviewed in electronic medical record.   Review of Systems: No headache, visual changes, nausea, vomiting, diarrhea, constipation, dizziness, abdominal pain, skin rash, fevers, chills, night sweats, weight loss, swollen lymph nodes, body aches, joint swelling, muscle aches, chest pain, shortness of breath, mood changes.   Objective Blood pressure 132/82, pulse 82.  General: No apparent distress alert and oriented x3 mood and affect normal, dressed appropriately. Appears ill HEENT: Pupils equal, extraocular movements intact significant tenderness of the frontal sinuses bilaterally. Respiratory: Significant copy with X Hattori wheezes heard throughout but no focal findings  Cardiovascular: No lower extremity edema, non tender, no erythema  Skin: Warm dry intact with no signs of infection or rash on extremities or on axial skeleton.  Abdomen: Soft nontender  Neuro: Cranial nerves II through XII are intact, neurovascularly intact in all extremities with 2+ DTRs and 2+ pulses.  Lymph: No lymphadenopathy of posterior or anterior cervical chain or axillae bilaterally.  Gait normal with good balance and coordination.  MSK:  Non tender with full range of motion and good stability and symmetric strength and tone of shoulders, elbows, wrist, hip, and ankles bilaterally.  Knee: Left Normal to inspection with no erythema or effusion or obvious bony abnormalities. Nontender on exam today ROM full in flexion and extension and lower leg rotation. Ligaments with solid consistent endpoints including ACL, PCL, LCL, MCL. Negative Mcmurray's, Apley's, and Thessalonian tests. Mild painful patellar compression.  Patellar glide without crepitus.  Patellar and quadriceps tendons unremarkable. Hamstring and quadriceps strength is normal.  contralateral knee unremarkable Mild improvement    Osteopathic findings  T3 extended rotated and side bent right T8 extended rotated and side bent left L2 flexed rotated  inside that right Sacrum left on left Right posterior illium        Impression and Recommendations:     This case required medical decision making of moderate complexity.

## 2015-12-08 NOTE — Patient Instructions (Signed)
I am so sorry you are sick  Shot today to help Albuterol up to 4 times daiy  Augmenetin 2 times daily for next 10 days Tussinex at night for the cough  See me again in 2 weeks and we will try manipulation.

## 2015-12-08 NOTE — Assessment & Plan Note (Signed)
Would've considered osteopathic manipulative again but I do feel that manipulation could cause patient had worsening cough. We discussed icing. We discussed hip abductors. Patient will come back again in 2 weeks and we will start manipulation again.

## 2015-12-23 ENCOUNTER — Ambulatory Visit (INDEPENDENT_AMBULATORY_CARE_PROVIDER_SITE_OTHER): Payer: BLUE CROSS/BLUE SHIELD | Admitting: Family Medicine

## 2015-12-23 VITALS — BP 118/78 | HR 74 | Wt 152.0 lb

## 2015-12-23 DIAGNOSIS — M9905 Segmental and somatic dysfunction of pelvic region: Secondary | ICD-10-CM

## 2015-12-23 DIAGNOSIS — M533 Sacrococcygeal disorders, not elsewhere classified: Secondary | ICD-10-CM | POA: Diagnosis not present

## 2015-12-23 DIAGNOSIS — M9903 Segmental and somatic dysfunction of lumbar region: Secondary | ICD-10-CM | POA: Diagnosis not present

## 2015-12-23 DIAGNOSIS — M9904 Segmental and somatic dysfunction of sacral region: Secondary | ICD-10-CM | POA: Diagnosis not present

## 2015-12-23 DIAGNOSIS — M999 Biomechanical lesion, unspecified: Secondary | ICD-10-CM

## 2015-12-23 NOTE — Patient Instructions (Signed)
Good to see you  Do not lace last eye on shoe and this can help some of the pain  Continue the heel lift.  Ice after a lot of activity  Exercises on wall.  Heel and butt touching.  Raise leg 6 inches and hold 2 seconds.  Down slow for count of 4 seconds.  1 set of 30 reps daily on both sides.  Work on core strength as well.  pennsaid pinkie amount topically 2 times daily as needed.  See me again in 3-4 weeks.

## 2015-12-23 NOTE — Assessment & Plan Note (Signed)
Decision today to treat with OMT was based on Physical Exam  After verbal consent patient was treated with HVLA, ME, FPR techniques in thoracic, lumbar, sacral and iliac areas  Patient tolerated the procedure well with improvement in symptoms  Patient given exercises, stretches and lifestyle modifications  See medications in patient instructions if given  Patient will follow up in 4-6 weeks

## 2015-12-23 NOTE — Progress Notes (Signed)
[name] [address], [place] [address] Phone: [phone] Subjective:     CC: Right foot pain and knee pain, Low back pain follow-up [id number] Cynthia Nichols is a 55 y.o. female coming in with complaint of foot pain. Patient was seen previously and did have a fracture of the fifth phalanx and metatarsal.Mild discomfort at the end of a long day but nothing that seems to be severe. Walking regularly.  Patient does have a patellar fracture that is slowly healing and patient has following up with a Workmen's Comp. physician. Patient still walks with a little bit of a limp she states but no significant pain unless she puts pressure on the knee Specifically.  Patient did respond well to osteopathic manipulation again for sacroiliac joint. Patient did have a recent illness and cough. Was having worsening discomfort. He is doing much better at this time. With the cough. States that the lower back seems to give her trouble especially with standing and lifting regularly.     Past Medical History  Diagnosis Date  . IBS (irritable bowel syndrome)   . Asthma   . Adopted     Patient has no known family history  . Headache(784.0)   . Venous aneurysm     Right side of neck   Past Surgical History  Procedure Laterality Date  . Abdominal hysterectomy    . Breast mass excision      Bilateral breast mass excision ( Benign)   Social History  Substance Use Topics  . Smoking status: Never Smoker   . Smokeless tobacco: Never Used  . Alcohol Use: No   Allergies  Allergen Reactions  . Imitrex [Sumatriptan]    No family history on file. no family history that is pertinent to chief complaintDenies family history of rheumatological diseases.    Past medical history, social, surgical and family history all reviewed in electronic medical record.   Review of Systems: No headache, visual changes, nausea, vomiting, diarrhea, constipation, dizziness,  abdominal pain, skin rash, fevers, chills, night sweats, weight loss, swollen lymph nodes, body aches, joint swelling, muscle aches, chest pain, shortness of breath, mood changes.   Objective Blood pressure 118/78, pulse 74, weight 152 lb (68.947 kg).  General: No apparent distress alert and oriented x3 mood and affect normal, dressed appropriately. Appears ill HEENT: Pupils equal, extraocular movements intact significant tenderness of the frontal sinuses bilaterally. Respiratory: Significant copy with X Hattori wheezes heard throughout but no focal findings  Cardiovascular: No lower extremity edema, non tender, no erythema  Skin: Warm dry intact with no signs of infection or rash on extremities or on axial skeleton.  Abdomen: Soft nontender  Neuro: Cranial nerves II through XII are intact, neurovascularly intact in all extremities with 2+ DTRs and 2+ pulses.  Lymph: No lymphadenopathy of posterior or anterior cervical chain or axillae bilaterally.  Gait Very mild antalgic gait MSK:  Non tender with full range of motion and good stability and symmetric strength and tone of shoulders, elbows, wrist, hip, and ankles bilaterally.  Knee: Left Normal to inspection with no erythema or effusion or obvious bony abnormalities. Nontender on exam today ROM full in flexion and extension and lower leg rotation. Ligaments with solid consistent endpoints including ACL, PCL, LCL, MCL. Negative Mcmurray's, Apley's, and Thessalonian tests. Mild painful patellar compression.  Patellar glide With mild crepitus. Patellar and quadriceps tendons unremarkable. Hamstring and quadriceps strength is normal.  contralateral knee unremarkable Stable  Back Exam:  Inspection: Unremarkable  Motion: Flexion 45 deg, Extension 25 deg, Side Bending to 45 deg bilaterally,  Rotation to 45 deg bilaterally  SLR laying: Negative  XSLR laying: Negative  Palpable tenderness: Tenderness over the right sacroiliac joint FABER:  Positive right Sensory change: Gross sensation intact to all lumbar and sacral dermatomes.  Reflexes: 2+ at both patellar tendons, 2+ at achilles tendons, Babinski's downgoing.  Strength at foot  Plantar-flexion: 5/5 Dorsi-flexion: 5/5 Eversion: 5/5 Inversion: 5/5  Leg strength  Leg strength is 4 out of 5 but symmetric   Osteopathic findings  T3 extended rotated and side bent right T8 extended rotated and side bent left L2 flexed rotated inside that right Sacrum left on left Right posterior illium        Impression and Recommendations:     This case required medical decision making of moderate complexity.

## 2015-12-23 NOTE — Assessment & Plan Note (Signed)
Continues to have some discomfort. Patient did respond very well to osteopathic manipulation today. We discussed icing regimen and home exercises. It encourage patient to do more of the core stability including hip abductors and showed what to do. Patient will continue on the gabapentin and baclofen. Patient and will follow-up with me again in 4-6 weeks for further evaluation and treatment.

## 2016-01-20 ENCOUNTER — Ambulatory Visit: Payer: Self-pay | Admitting: Family Medicine

## 2016-01-26 ENCOUNTER — Ambulatory Visit: Payer: BLUE CROSS/BLUE SHIELD | Admitting: Family Medicine

## 2016-01-26 DIAGNOSIS — Z0289 Encounter for other administrative examinations: Secondary | ICD-10-CM

## 2016-08-26 ENCOUNTER — Ambulatory Visit (INDEPENDENT_AMBULATORY_CARE_PROVIDER_SITE_OTHER): Payer: BLUE CROSS/BLUE SHIELD | Admitting: Family Medicine

## 2016-08-26 ENCOUNTER — Ambulatory Visit: Payer: Self-pay

## 2016-08-26 ENCOUNTER — Encounter: Payer: Self-pay | Admitting: Family Medicine

## 2016-08-26 VITALS — BP 130/82 | HR 73 | Ht 67.0 in | Wt 153.0 lb

## 2016-08-26 DIAGNOSIS — S5010XA Contusion of unspecified forearm, initial encounter: Secondary | ICD-10-CM | POA: Insufficient documentation

## 2016-08-26 DIAGNOSIS — M79602 Pain in left arm: Secondary | ICD-10-CM

## 2016-08-26 MED ORDER — VITAMIN D (ERGOCALCIFEROL) 1.25 MG (50000 UNIT) PO CAPS: 50000.0000 [IU] | ORAL_CAPSULE | ORAL | 0 refills | Status: DC

## 2016-08-26 NOTE — Progress Notes (Signed)
[facility] [address] [address] [place], [place] [address] Phone: [phone] Subjective:     CC: Left Arm pain  [id number]  Cynthia Nichols is a 55 y.o. female coming in with complaint of left Arm pain. Patient states that she trying to grab to help her from falling and 1.. Patient and forcefully has had forearm pain. Patient states it is more of a dull ache. Seems to hurt only with pressure in the area. Can do elbow movements. Denies any weakness. Patient has had some mild multiple sclerosis flares recently. Patient states that he does not think is secondary to that. Fall was 3 weeks ago.  Past Medical History:  Diagnosis Date  . Adopted    Patient has no known family history  . Asthma   . Headache(784.0)   . IBS (irritable bowel syndrome)   . Venous aneurysm    Right side of neck  Multiple sclerosis  Past Surgical History:  Procedure Laterality Date  . ABDOMINAL HYSTERECTOMY    . BREAST MASS EXCISION     Bilateral breast mass excision ( Benign)   Social History   Social History  . Marital status: Married    Spouse name: N/A  . Number of children: N/A  . Years of education: N/A   Social History Main Topics  . Smoking status: Never Smoker  . Smokeless tobacco: Never Used  . Alcohol use No  . Drug use: No  . Sexual activity: Not Asked   Other Topics Concern  . None   Social History Narrative  . None   Allergies  Allergen Reactions  . Imitrex [Sumatriptan]    No family history on file. No known rheumatological diseases  Past medical history, social, surgical and family history all reviewed in electronic medical record.  No pertanent information unless stated regarding to the chief complaint.   Review of Systems:Review of systems updated and as accurate as of 08/26/16  No headache, visual changes, nausea, vomiting, diarrhea, constipation, dizziness, abdominal pain, skin rash, fevers, chills, night sweats, weight loss, swollen lymph  nodes, body aches, joint swelling, muscle aches, chest pain, shortness of breath, mood changes.   Objective  Blood pressure 130/82, pulse 73, height 5\' 7"  (1.702 m), weight 153 lb (69.4 kg), SpO2 98 %. Systems examined below as of 08/26/16   General: No apparent distress alert and oriented x3 mood and affect normal, dressed appropriately.  HEENT: Pupils equal, extraocular movements intact  Respiratory: Patient's speak in full sentences and does not appear short of breath  Cardiovascular: No lower extremity edema, non tender, no erythema  Skin: Warm dry intact with no signs of infection or rash on extremities or on axial skeleton.  Abdomen: Soft nontender  Neuro: Cranial nerves II through XII are intact, neurovascularly intact in all extremities with 2+ DTRs and 2+ pulses.  Lymph: No lymphadenopathy of posterior or anterior cervical chain or axillae bilaterally.  Gait wide-based gait MSK:  Mild weakness of all musculature today. Seems to be symmetric Shoulder: left Inspection reveals no abnormalities, atrophy or asymmetry. Palpation is normal with no tenderness over AC joint or bicipital groove. ROM is full in all planes passively. Rotator cuff strength normal throughout. signs of impingement with positive Neer and Hawkin's tests, but negative empty can sign.  Speeds and Yergason's tests normal. No labral pathology noted with negative Obrien's, negative clunk and good stability. Normal scapular function observed. No painful arc and no drop arm sign. No apprehension sign Contralateral shoulder  unremarkable Forearm does have tenderness to palpation on the ulnar aspect Patient does have full range of motion of the elbow and nontender though.  Musculoskeletal ultrasound was performed and interpreted by Charlann Boxer D.O.   Elbow:  Limited ultrasound shows some mild thickening of the cortex but no cortical defect noted. Mild increase in Doppler flow. No muscle pain.  IMPRESSION:  Forearm  contusion     Impression and Recommendations:     This case required medical decision making of moderate complexity.      Note: This dictation was prepared with Dragon dictation along with smaller phrase technology. Any transcriptional errors that result from this process are unintentional.

## 2016-08-26 NOTE — Assessment & Plan Note (Addendum)
New problem. Patient doesn't more of a contusion noted. We discussed with patient at great length. Patient though is 3 weeks out from the injury and should be getting better. Patient will be started on once weekly vitamin D. Hopefully this will be beneficial. Encourage to continue the other regimen at this time. There is a possibility that her multiple sclerosis is slowing down healing. We will continue to monitor and follow up again in 2-3 weeks. Discussed padding

## 2016-08-26 NOTE — Patient Instructions (Addendum)
Good to see you  Once weekly vitamin D for 12 weeks.  Consider a forearm sleeve or guard.  Call me in 2 weeks or see me if not better Happy holidays!

## 2016-09-15 ENCOUNTER — Ambulatory Visit: Payer: Self-pay | Admitting: Family Medicine

## 2016-09-30 DIAGNOSIS — R269 Unspecified abnormalities of gait and mobility: Secondary | ICD-10-CM | POA: Diagnosis not present

## 2016-09-30 DIAGNOSIS — G35 Multiple sclerosis: Secondary | ICD-10-CM | POA: Diagnosis not present

## 2016-10-01 DIAGNOSIS — Z1231 Encounter for screening mammogram for malignant neoplasm of breast: Secondary | ICD-10-CM | POA: Diagnosis not present

## 2016-10-01 DIAGNOSIS — R269 Unspecified abnormalities of gait and mobility: Secondary | ICD-10-CM | POA: Diagnosis not present

## 2016-10-01 DIAGNOSIS — G35 Multiple sclerosis: Secondary | ICD-10-CM | POA: Diagnosis not present

## 2016-10-04 DIAGNOSIS — Z01419 Encounter for gynecological examination (general) (routine) without abnormal findings: Secondary | ICD-10-CM | POA: Diagnosis not present

## 2016-10-04 DIAGNOSIS — Z6825 Body mass index (BMI) 25.0-25.9, adult: Secondary | ICD-10-CM | POA: Diagnosis not present

## 2016-10-25 DIAGNOSIS — E860 Dehydration: Secondary | ICD-10-CM | POA: Diagnosis not present

## 2016-10-25 DIAGNOSIS — R110 Nausea: Secondary | ICD-10-CM | POA: Diagnosis not present

## 2016-10-25 DIAGNOSIS — R109 Unspecified abdominal pain: Secondary | ICD-10-CM | POA: Diagnosis not present

## 2016-10-25 DIAGNOSIS — R748 Abnormal levels of other serum enzymes: Secondary | ICD-10-CM | POA: Diagnosis not present

## 2016-10-25 DIAGNOSIS — M549 Dorsalgia, unspecified: Secondary | ICD-10-CM | POA: Diagnosis not present

## 2016-10-29 DIAGNOSIS — R748 Abnormal levels of other serum enzymes: Secondary | ICD-10-CM | POA: Diagnosis not present

## 2016-10-29 DIAGNOSIS — R1013 Epigastric pain: Secondary | ICD-10-CM | POA: Diagnosis not present

## 2016-11-19 DIAGNOSIS — G44211 Episodic tension-type headache, intractable: Secondary | ICD-10-CM | POA: Diagnosis not present

## 2016-11-19 DIAGNOSIS — R202 Paresthesia of skin: Secondary | ICD-10-CM | POA: Diagnosis not present

## 2016-11-19 DIAGNOSIS — G43119 Migraine with aura, intractable, without status migrainosus: Secondary | ICD-10-CM | POA: Diagnosis not present

## 2016-11-19 DIAGNOSIS — G35 Multiple sclerosis: Secondary | ICD-10-CM | POA: Diagnosis not present

## 2016-11-26 DIAGNOSIS — M545 Low back pain: Secondary | ICD-10-CM | POA: Diagnosis not present

## 2016-11-26 DIAGNOSIS — G35 Multiple sclerosis: Secondary | ICD-10-CM | POA: Diagnosis not present

## 2016-11-26 DIAGNOSIS — M546 Pain in thoracic spine: Secondary | ICD-10-CM | POA: Diagnosis not present

## 2016-11-26 DIAGNOSIS — R269 Unspecified abnormalities of gait and mobility: Secondary | ICD-10-CM | POA: Diagnosis not present

## 2016-11-30 DIAGNOSIS — M545 Low back pain: Secondary | ICD-10-CM | POA: Diagnosis not present

## 2016-11-30 DIAGNOSIS — G35 Multiple sclerosis: Secondary | ICD-10-CM | POA: Diagnosis not present

## 2016-11-30 DIAGNOSIS — M546 Pain in thoracic spine: Secondary | ICD-10-CM | POA: Diagnosis not present

## 2016-11-30 DIAGNOSIS — R269 Unspecified abnormalities of gait and mobility: Secondary | ICD-10-CM | POA: Diagnosis not present

## 2016-12-07 DIAGNOSIS — M546 Pain in thoracic spine: Secondary | ICD-10-CM | POA: Diagnosis not present

## 2016-12-07 DIAGNOSIS — M545 Low back pain: Secondary | ICD-10-CM | POA: Diagnosis not present

## 2016-12-07 DIAGNOSIS — G35 Multiple sclerosis: Secondary | ICD-10-CM | POA: Diagnosis not present

## 2016-12-07 DIAGNOSIS — R269 Unspecified abnormalities of gait and mobility: Secondary | ICD-10-CM | POA: Diagnosis not present

## 2016-12-09 DIAGNOSIS — R05 Cough: Secondary | ICD-10-CM | POA: Diagnosis not present

## 2016-12-09 DIAGNOSIS — J029 Acute pharyngitis, unspecified: Secondary | ICD-10-CM | POA: Diagnosis not present

## 2016-12-09 DIAGNOSIS — J208 Acute bronchitis due to other specified organisms: Secondary | ICD-10-CM | POA: Diagnosis not present

## 2016-12-10 DIAGNOSIS — M21371 Foot drop, right foot: Secondary | ICD-10-CM | POA: Diagnosis not present

## 2016-12-10 DIAGNOSIS — G35 Multiple sclerosis: Secondary | ICD-10-CM | POA: Diagnosis not present

## 2016-12-13 ENCOUNTER — Telehealth: Payer: PRIVATE HEALTH INSURANCE | Admitting: Adult Health

## 2016-12-13 DIAGNOSIS — J069 Acute upper respiratory infection, unspecified: Secondary | ICD-10-CM

## 2016-12-13 MED ORDER — DOXYCYCLINE HYCLATE 100 MG PO CAPS: 100.0000 mg | ORAL_CAPSULE | Freq: Two times a day (BID) | ORAL | 0 refills | Status: DC

## 2016-12-13 MED ORDER — BENZONATATE 100 MG PO CAPS: 100.0000 mg | ORAL_CAPSULE | Freq: Two times a day (BID) | ORAL | 0 refills | Status: DC | PRN

## 2016-12-13 MED ORDER — PREDNISONE 10 MG (21) PO TBPK: ORAL_TABLET | ORAL | 0 refills | Status: DC

## 2016-12-13 NOTE — Progress Notes (Signed)
We are sorry that you are not feeling well.  Here is how we plan to help!  Based on what you have shared with me it looks like you have upper respiratory tract inflammation that has resulted in a significant cough.  Inflammation and infection in the upper respiratory tract is commonly called bronchitis and has four common causes:  Allergies, Viral Infections, Acid Reflux and Bacterial Infections.  Allergies, viruses and acid reflux are treated by controlling symptoms or eliminating the cause. An example might be a cough caused by taking certain blood pressure medications. You stop the cough by changing the medication. Another example might be a cough caused by acid reflux. Controlling the reflux helps control the cough.  Based on your presentation I believe you most likely have A cough due to bacteria.  When patients have a fever and a productive cough with a change in color or increased sputum production, we are concerned about bacterial bronchitis.  If left untreated it can progress to pneumonia.  If your symptoms do not improve with your treatment plan it is important that you contact your provider.   I hve prescribed Doxycycline 100 mg twice a day for 7 days     In addition you may use A prescription cough medication called Tessalon Perles 100mg . You may take 1-2 capsules every 8 hours as needed for your cough.  Sterapred 10 mg dosepak  USE OF BRONCHODILATOR ("RESCUE") INHALERS: There is a risk from using your bronchodilator too frequently.  The risk is that over-reliance on a medication which only relaxes the muscles surrounding the breathing tubes can reduce the effectiveness of medications prescribed to reduce swelling and congestion of the tubes themselves.  Although you feel brief relief from the bronchodilator inhaler, your asthma may actually be worsening with the tubes becoming more swollen and filled with mucus.  This can delay other crucial treatments, such as oral steroid medications. If  you need to use a bronchodilator inhaler daily, several times per day, you should discuss this with your provider.  There are probably better treatments that could be used to keep your asthma under control.     HOME CARE . Only take medications as instructed by your medical team. . Complete the entire course of an antibiotic. . Drink plenty of fluids and get plenty of rest. . Avoid close contacts especially the very young and the elderly . Cover your mouth if you cough or cough into your sleeve. . Always remember to wash your hands . A steam or ultrasonic humidifier can help congestion.   GET HELP RIGHT AWAY IF: . You develop worsening fever. . You become short of breath . You cough up blood. . Your symptoms persist after you have completed your treatment plan MAKE SURE YOU   Understand these instructions.  Will watch your condition.  Will get help right away if you are not doing well or get worse.  Your e-visit answers were reviewed by a board certified advanced clinical practitioner to complete your personal care plan.  Depending on the condition, your plan could have included both over the counter or prescription medications. If there is a problem please reply  once you have received a response from your provider. Your safety is important to [place].  If you have drug allergies check your prescription carefully.    You can use MyChart to ask questions about today's visit, request a non-urgent call back, or ask for a work or school excuse for 24 hours related to  this e-Visit. If it has been greater than 24 hours you will need to follow up with your provider, or enter a new e-Visit to address those concerns. You will get an e-mail in the next two days asking about your experience.  I hope that your e-visit has been valuable and will speed your recovery. Thank you for using e-visits.

## 2016-12-20 DIAGNOSIS — M545 Low back pain: Secondary | ICD-10-CM | POA: Diagnosis not present

## 2016-12-20 DIAGNOSIS — G35 Multiple sclerosis: Secondary | ICD-10-CM | POA: Diagnosis not present

## 2016-12-20 DIAGNOSIS — R269 Unspecified abnormalities of gait and mobility: Secondary | ICD-10-CM | POA: Diagnosis not present

## 2016-12-20 DIAGNOSIS — M546 Pain in thoracic spine: Secondary | ICD-10-CM | POA: Diagnosis not present

## 2016-12-28 DIAGNOSIS — M545 Low back pain: Secondary | ICD-10-CM | POA: Diagnosis not present

## 2016-12-28 DIAGNOSIS — M546 Pain in thoracic spine: Secondary | ICD-10-CM | POA: Diagnosis not present

## 2016-12-28 DIAGNOSIS — R269 Unspecified abnormalities of gait and mobility: Secondary | ICD-10-CM | POA: Diagnosis not present

## 2016-12-28 DIAGNOSIS — G35 Multiple sclerosis: Secondary | ICD-10-CM | POA: Diagnosis not present

## 2017-01-04 DIAGNOSIS — M545 Low back pain: Secondary | ICD-10-CM | POA: Diagnosis not present

## 2017-01-04 DIAGNOSIS — R269 Unspecified abnormalities of gait and mobility: Secondary | ICD-10-CM | POA: Diagnosis not present

## 2017-01-04 DIAGNOSIS — G35 Multiple sclerosis: Secondary | ICD-10-CM | POA: Diagnosis not present

## 2017-01-04 DIAGNOSIS — M546 Pain in thoracic spine: Secondary | ICD-10-CM | POA: Diagnosis not present

## 2017-01-06 DIAGNOSIS — R269 Unspecified abnormalities of gait and mobility: Secondary | ICD-10-CM | POA: Diagnosis not present

## 2017-01-06 DIAGNOSIS — M545 Low back pain: Secondary | ICD-10-CM | POA: Diagnosis not present

## 2017-01-06 DIAGNOSIS — G35 Multiple sclerosis: Secondary | ICD-10-CM | POA: Diagnosis not present

## 2017-01-06 DIAGNOSIS — M546 Pain in thoracic spine: Secondary | ICD-10-CM | POA: Diagnosis not present

## 2017-01-12 DIAGNOSIS — M21371 Foot drop, right foot: Secondary | ICD-10-CM | POA: Diagnosis not present

## 2017-01-12 DIAGNOSIS — R29898 Other symptoms and signs involving the musculoskeletal system: Secondary | ICD-10-CM | POA: Diagnosis not present

## 2017-01-12 DIAGNOSIS — R269 Unspecified abnormalities of gait and mobility: Secondary | ICD-10-CM | POA: Diagnosis not present

## 2017-01-12 DIAGNOSIS — G35 Multiple sclerosis: Secondary | ICD-10-CM | POA: Diagnosis not present

## 2017-01-12 DIAGNOSIS — M5441 Lumbago with sciatica, right side: Secondary | ICD-10-CM | POA: Diagnosis not present

## 2017-01-12 DIAGNOSIS — Z79899 Other long term (current) drug therapy: Secondary | ICD-10-CM | POA: Diagnosis not present

## 2017-01-17 DIAGNOSIS — G35 Multiple sclerosis: Secondary | ICD-10-CM | POA: Diagnosis not present

## 2017-01-17 DIAGNOSIS — R269 Unspecified abnormalities of gait and mobility: Secondary | ICD-10-CM | POA: Diagnosis not present

## 2017-01-17 DIAGNOSIS — M5441 Lumbago with sciatica, right side: Secondary | ICD-10-CM | POA: Diagnosis not present

## 2017-01-17 DIAGNOSIS — R29898 Other symptoms and signs involving the musculoskeletal system: Secondary | ICD-10-CM | POA: Diagnosis not present

## 2017-02-04 NOTE — Progress Notes (Signed)
[name] [place], [place] [address] Phone: [phone] Subjective:    I'm seeing this patient by the request  of:    CC: low back pain   [id number]  Cynthia Nichols is a 56 y.o. female coming in with complaint of low back pain. Has been quite some time since we seen patient. Patient had been treated for more of a sacroiliac joint dysfunction. Does have a history of multiple sclerosis. Patient states has seen a neurologist recently. Last scans did not show any significant abnormality or cord enhancement at the time. Was told that was more of a lumbar radiculopathy was sent here for further evaluation. Patient was given a prednisone taper earlier. Patient is doing much better since she was given the prednisone. States that the pain though is slowly coming back with her off of the medication at this time..  Patient did have an MRI of her spine taken 01/17/2017. No significant bony normality and no signs of any multiple sclerosis flare.    Past Medical History:  Diagnosis Date  . Adopted    Patient has no known family history  . Asthma   . Headache(784.0)   . IBS (irritable bowel syndrome)   . Venous aneurysm    Right side of neck   Past Surgical History:  Procedure Laterality Date  . ABDOMINAL HYSTERECTOMY    . BREAST MASS EXCISION     Bilateral breast mass excision ( Benign)   Social History   Social History  . Marital status: Married    Spouse name: N/A  . Number of children: N/A  . Years of education: N/A   Social History Main Topics  . Smoking status: Never Smoker  . Smokeless tobacco: Never Used  . Alcohol use No  . Drug use: No  . Sexual activity: Not Asked   Other Topics Concern  . None   Social History Narrative  . None   Allergies  Allergen Reactions  . Imitrex [Sumatriptan]    No family history on file.  Past medical history, social, surgical and family history all reviewed in electronic medical  record.  No pertanent information unless stated regarding to the chief complaint.   Review of Systems:Review of systems updated and as accurate as of 02/07/17  No visual changes, nausea, vomiting, diarrhea, constipation, dizziness, abdominal pain, skin rash, fevers, chills, night sweats, weight loss, swollen lymph nodes,  chest pain, shortness of breath, mood changes. Positive muscle aches body aches positive headaches  Objective  Blood pressure 128/80, pulse 70, height 5\' 7"  (1.702 m), weight 161 lb (73 kg). Systems examined below as of 02/07/17   General: No apparent distress alert and oriented x3 mood and affect normal, dressed appropriately.  HEENT: Pupils equal, extraocular movements intact  Respiratory: Patient's speak in full sentences and does not appear short of breath  Cardiovascular: No lower extremity edema, non tender, no erythema  Skin: Warm dry intact with no signs of infection or rash on extremities or on axial skeleton.  Abdomen: Soft nontender  Neuro: Cranial nerves II through XII are intact, neurovascularly intact in all extremities with 2+ DTRs and 2+ pulses.  Lymph: No lymphadenopathy of posterior or anterior cervical chain or axillae bilaterally.  Gait normal with good balance and coordination.  MSK:  Non tender with full range of motion and good stability and symmetric strength and tone of shoulders, elbows, wrist, hip, knee and ankles bilaterally.  Back Exam:  Inspection: Unremarkable  Motion: Flexion 45 deg, Extension 15 deg, Side Bending to 35 deg bilaterally,  Rotation to 30 deg bilaterally  SLR laying: Negative  XSLR laying: Negative  Palpable tenderness: Tender to palpation bilaterally in the paraspinal musculature at the thoracolumbar juncture as well as at the lumbosacral juncture. FABER: Positive tightness right. Sensory change: Gross sensation intact to all lumbar and sacral dermatomes.  Reflexes: 2+ at both patellar tendons, 2+ at achilles tendons,  Babinski's downgoing.  Strength at foot  Plantar-flexion: 5/5 Dorsi-flexion: 5/5 Eversion: 5/5 Inversion: 5/5  Leg strength  Quad: 5/5 Hamstring: 5/5 Hip flexor: 5/5 Hip abductors: 5/5  Gait unremarkable.  Osteopathic findings T3 extended rotated and side bent right inhaled third rib T9 extended rotated and side bent left L2 flexed rotated and side bent right L4 flexed rotated and side bent left Sacrum left on left    Impression and Recommendations:     This case required medical decision making of moderate complexity.      Note: This dictation was prepared with Dragon dictation along with smaller phrase technology. Any transcriptional errors that result from this process are unintentional.

## 2017-02-07 ENCOUNTER — Ambulatory Visit (INDEPENDENT_AMBULATORY_CARE_PROVIDER_SITE_OTHER): Payer: 59 | Admitting: Family Medicine

## 2017-02-07 ENCOUNTER — Encounter: Payer: Self-pay | Admitting: Family Medicine

## 2017-02-07 VITALS — BP 128/80 | HR 70 | Ht 67.0 in | Wt 161.0 lb

## 2017-02-07 DIAGNOSIS — M533 Sacrococcygeal disorders, not elsewhere classified: Secondary | ICD-10-CM

## 2017-02-07 DIAGNOSIS — M999 Biomechanical lesion, unspecified: Secondary | ICD-10-CM | POA: Diagnosis not present

## 2017-02-07 DIAGNOSIS — G35 Multiple sclerosis: Secondary | ICD-10-CM | POA: Diagnosis not present

## 2017-02-07 MED ORDER — VITAMIN D (ERGOCALCIFEROL) 1.25 MG (50000 UNIT) PO CAPS: 50000.0000 [IU] | ORAL_CAPSULE | ORAL | 0 refills | Status: DC

## 2017-02-07 NOTE — Patient Instructions (Signed)
Good to see you  I hope the manipulation helps Once weekly vitamin D for 12 weeks.  Over the counter  Iron 65mg  daily with 500mg  of vitamin C B6 200mg  daily  Tart cherry extract any dose at night Turmeric 500mg  twice daily  See me again in 3-4 weeks to make sure we are doing better.

## 2017-02-07 NOTE — Assessment & Plan Note (Signed)
Worsening symptoms. On the contralateral side from usual. We discussed icing regimen and home exercises. But on once weekly vitamin D for muscle strength and endurance. We discussed which activities doing which ones to avoid. Increase activity as tolerated. Follow-up again in 3-4 weeks.

## 2017-02-07 NOTE — Assessment & Plan Note (Signed)
Decision today to treat with OMT was based on Physical Exam  After verbal consent patient was treated with HVLA, ME, FPR techniques in cervical, thoracic, lumbar and sacral areas  Patient tolerated the procedure well with improvement in symptoms  Patient given exercises, stretches and lifestyle modifications  See medications in patient instructions if given  Patient will follow up in 4-6 weeks 

## 2017-02-07 NOTE — Assessment & Plan Note (Signed)
Stable. Following up with his neurologist.

## 2017-03-09 ENCOUNTER — Encounter: Payer: Self-pay | Admitting: Family Medicine

## 2017-03-09 ENCOUNTER — Ambulatory Visit (INDEPENDENT_AMBULATORY_CARE_PROVIDER_SITE_OTHER): Payer: 59 | Admitting: Family Medicine

## 2017-03-09 ENCOUNTER — Other Ambulatory Visit (INDEPENDENT_AMBULATORY_CARE_PROVIDER_SITE_OTHER): Payer: 59

## 2017-03-09 VITALS — BP 122/78 | HR 80 | Ht 67.0 in

## 2017-03-09 DIAGNOSIS — M533 Sacrococcygeal disorders, not elsewhere classified: Secondary | ICD-10-CM | POA: Diagnosis not present

## 2017-03-09 DIAGNOSIS — G35 Multiple sclerosis: Secondary | ICD-10-CM | POA: Diagnosis not present

## 2017-03-09 DIAGNOSIS — M999 Biomechanical lesion, unspecified: Secondary | ICD-10-CM | POA: Diagnosis not present

## 2017-03-09 DIAGNOSIS — R202 Paresthesia of skin: Secondary | ICD-10-CM | POA: Diagnosis not present

## 2017-03-09 DIAGNOSIS — G43119 Migraine with aura, intractable, without status migrainosus: Secondary | ICD-10-CM | POA: Diagnosis not present

## 2017-03-09 LAB — IBC PANEL
Iron: 195 ug/dL — ABNORMAL HIGH (ref 42–145)
Saturation Ratios: 45.8 % (ref 20.0–50.0)
Transferrin: 304 mg/dL (ref 212.0–360.0)

## 2017-03-09 LAB — CALCIUM, IONIZED: Calcium, Ion: 5.1 mg/dL (ref 4.8–5.6)

## 2017-03-09 LAB — FERRITIN: FERRITIN: 84.9 ng/mL (ref 10.0–291.0)

## 2017-03-09 LAB — SEDIMENTATION RATE: Sed Rate: 12 mm/hr (ref 0–30)

## 2017-03-09 LAB — TSH: TSH: 1.07 u[IU]/mL (ref 0.35–4.50)

## 2017-03-09 LAB — VITAMIN D 25 HYDROXY (VIT D DEFICIENCY, FRACTURES): VITD: 67.63 ng/mL (ref 30.00–100.00)

## 2017-03-09 NOTE — Progress Notes (Signed)
[facility] [place], [place] [address] Phone: [phone] Subjective:     CC: low back pain f/u  [id number]  KRISTINE CHAHAL is a 56 y.o. female coming in with complaint of low back pain. Has been quite some time since we seen patient. Patient had been treated for more of a sacroiliac joint dysfunction. Does have a history of multiple sclerosis. Patient states has seen a neurologist recently. Last scans did not show any significant abnormality or cord enhancement at the time. Patient is a me 3 weeks ago and was having more of a lumbar radiculopathy. Patient has responded well to manipulation the past. Patient was given exercises and encouraged her to do minimal regular basis. We discussed strengthening. Patient states overall doing relatively well.   Patient did have an MRI of her spine taken 01/17/2017. No significant bony normality and no signs of any multiple sclerosis flare.    Past Medical History:  Diagnosis Date  . Adopted    Patient has no known family history  . Asthma   . Headache(784.0)   . IBS (irritable bowel syndrome)   . Venous aneurysm    Right side of neck   Past Surgical History:  Procedure Laterality Date  . ABDOMINAL HYSTERECTOMY    . BREAST MASS EXCISION     Bilateral breast mass excision ( Benign)   Social History   Social History  . Marital status: Married    Spouse name: N/A  . Number of children: N/A  . Years of education: N/A   Social History Main Topics  . Smoking status: Never Smoker  . Smokeless tobacco: Never Used  . Alcohol use No  . Drug use: No  . Sexual activity: Not Asked   Other Topics Concern  . None   Social History Narrative  . None   Allergies  Allergen Reactions  . Imitrex [Sumatriptan]    No family history on file.  Past medical history, social, surgical and family history all reviewed in electronic medical record.  No pertanent information unless stated regarding to  the chief complaint.   Review of Systems: No  visual changes, nausea, vomiting, diarrhea, constipation, dizziness, abdominal pain, skin rash, fevers, chills, night sweats, weight loss, swollen lymph nodes, body aches, joint swelling,  chest pain, shortness of breath, mood changes.  Positive muscle aches, positive headaches, positive fatigue  Objective  Blood pressure 122/78, pulse 80, height 5\' 7"  (1.702 m), SpO2 98 %. Systems examined below as of 03/09/17   General: No apparent distress alert and oriented x3 mood and affect normal, dressed appropriately.  HEENT: Pupils equal, extraocular movements intact  Respiratory: Patient's speak in full sentences and does not appear short of breath  Cardiovascular: No lower extremity edema, non tender, no erythema  Skin: Warm dry intact with no signs of infection or rash on extremities or on axial skeleton.  Abdomen: Soft nontender  Neuro: Cranial nerves II through XII are intact, neurovascularly intact in all extremities with 2+ DTRs and 2+ pulses.  Lymph: No lymphadenopathy of posterior or anterior cervical chain or axillae bilaterally.  Gait normal with good balance and coordination.  MSK:  Non tender with full range of motion and good stability and symmetric strength and tone of shoulders, elbows, wrist, hip, knee and ankles bilaterally.  Back Exam:  Inspection: Unremarkable  Motion: Flexion 40 deg, Extension 15 deg, Side Bending to 35 deg bilaterally,  Rotation to 30 deg bilaterally  SLR laying: Negative  XSLR laying: Negative  Palpable tenderness: Mild tenderness to palpation still in the thoracolumbar and lumbosacral junctures bilaterally. FABER: Positive tightness right. Sensory change: Gross sensation intact to all lumbar and sacral dermatomes.  Reflexes: 2+ at both patellar tendons, 2+ at achilles tendons, Babinski's downgoing.  Strength at foot  Plantar-flexion: 5/5 Dorsi-flexion: 5/5 Eversion: 5/5 Inversion: 5/5  Leg strength  Quad:  5/5 Hamstring: 5/5 Hip flexor: 5/5 Hip abductors: 5/5  Gait unremarkable.  Osteopathic findings T4 extended rotated and side bent left inhaled rib T9 extended rotated and side bent right  L2 flexed rotated and side bent right Sacrum right on right Pelvic shear noted    Impression and Recommendations:     This case required medical decision making of moderate complexity.      Note: This dictation was prepared with Dragon dictation along with smaller phrase technology. Any transcriptional errors that result from this process are unintentional.

## 2017-03-09 NOTE — Assessment & Plan Note (Signed)
History of MS and patient is having more of an increasing fatigue which usually seems to be her first symptom. We will get an ESR to see if there is any inflammatory problems. Patient has had difficult he with iron previously as well. We will check lambs to further evaluate. Follow-up again with me in 4 weeks. We will make changes if necessary.

## 2017-03-09 NOTE — Patient Instructions (Addendum)
Good to see you as always COntinue the vitamins but stop DHEA after 4 weeks and stay off for 2 weeks Lets get labs Exercises One knee down one knee up tilt pelvis forward.  Hands overhead then rotate to upper leg.  Hold 10 seconds, relax, repeat and do other side as well.  Very important when after being in a flexed position for a long amount of time.  Keep working on the exercises and the core.  Wear good shoes See me again in 4-6 weeks

## 2017-03-09 NOTE — Assessment & Plan Note (Signed)
Decision today to treat with OMT was based on Physical Exam  After verbal consent patient was treated with HVLA, ME, FPR techniques in thoracic, lumbar and sacral, pelvic areas  Patient tolerated the procedure well with improvement in symptoms  Patient given exercises, stretches and lifestyle modifications  See medications in patient instructions if given  Patient will follow up in 4-6 weeks

## 2017-03-09 NOTE — Assessment & Plan Note (Signed)
Patient is stable overall. Continue to monitor closely. We discussed icing regimen and home exercises. Patient can continue the gabapentin. Once weekly vitamin D encouraged. Hip abductor strengthening encourage. Follow-up again in 4-6 weeks

## 2017-03-10 LAB — PTH, INTACT AND CALCIUM
CALCIUM: 9.5 mg/dL (ref 8.6–10.4)
PTH: 74 pg/mL — AB (ref 14–64)

## 2017-03-21 DIAGNOSIS — S39012A Strain of muscle, fascia and tendon of lower back, initial encounter: Secondary | ICD-10-CM | POA: Diagnosis not present

## 2017-04-07 ENCOUNTER — Ambulatory Visit: Payer: Self-pay | Admitting: Family Medicine

## 2017-04-15 DIAGNOSIS — R300 Dysuria: Secondary | ICD-10-CM | POA: Diagnosis not present

## 2017-04-15 DIAGNOSIS — N390 Urinary tract infection, site not specified: Secondary | ICD-10-CM | POA: Diagnosis not present

## 2017-04-22 ENCOUNTER — Ambulatory Visit: Payer: Self-pay | Admitting: Family Medicine

## 2017-04-28 ENCOUNTER — Ambulatory Visit: Payer: Self-pay | Admitting: Family Medicine

## 2017-04-29 DIAGNOSIS — N390 Urinary tract infection, site not specified: Secondary | ICD-10-CM | POA: Diagnosis not present

## 2017-05-13 ENCOUNTER — Observation Stay (HOSPITAL_COMMUNITY): Payer: 59

## 2017-05-13 ENCOUNTER — Encounter (HOSPITAL_COMMUNITY): Payer: Self-pay | Admitting: Emergency Medicine

## 2017-05-13 ENCOUNTER — Observation Stay (HOSPITAL_COMMUNITY)
Admission: EM | Admit: 2017-05-13 | Discharge: 2017-05-14 | Disposition: A | Discharge status: Home / Self Care | Payer: 59 | Attending: Orthopedic Surgery | Admitting: Orthopedic Surgery

## 2017-05-13 ENCOUNTER — Emergency Department (HOSPITAL_COMMUNITY): Payer: 59

## 2017-05-13 DIAGNOSIS — Y9389 Activity, other specified: Secondary | ICD-10-CM | POA: Diagnosis not present

## 2017-05-13 DIAGNOSIS — S42351A Displaced comminuted fracture of shaft of humerus, right arm, initial encounter for closed fracture: Secondary | ICD-10-CM | POA: Diagnosis not present

## 2017-05-13 DIAGNOSIS — Z91010 Allergy to peanuts: Secondary | ICD-10-CM | POA: Insufficient documentation

## 2017-05-13 DIAGNOSIS — G5631 Lesion of radial nerve, right upper limb: Secondary | ICD-10-CM | POA: Insufficient documentation

## 2017-05-13 DIAGNOSIS — Z79899 Other long term (current) drug therapy: Secondary | ICD-10-CM | POA: Insufficient documentation

## 2017-05-13 DIAGNOSIS — I10 Essential (primary) hypertension: Secondary | ICD-10-CM | POA: Diagnosis not present

## 2017-05-13 DIAGNOSIS — Y92512 Supermarket, store or market as the place of occurrence of the external cause: Secondary | ICD-10-CM | POA: Diagnosis not present

## 2017-05-13 DIAGNOSIS — Z888 Allergy status to other drugs, medicaments and biological substances status: Secondary | ICD-10-CM | POA: Diagnosis not present

## 2017-05-13 DIAGNOSIS — S42391A Other fracture of shaft of right humerus, initial encounter for closed fracture: Secondary | ICD-10-CM

## 2017-05-13 DIAGNOSIS — Y998 Other external cause status: Secondary | ICD-10-CM | POA: Insufficient documentation

## 2017-05-13 DIAGNOSIS — W1809XA Striking against other object with subsequent fall, initial encounter: Secondary | ICD-10-CM | POA: Insufficient documentation

## 2017-05-13 DIAGNOSIS — K589 Irritable bowel syndrome without diarrhea: Secondary | ICD-10-CM | POA: Diagnosis not present

## 2017-05-13 DIAGNOSIS — G35 Multiple sclerosis: Secondary | ICD-10-CM | POA: Diagnosis not present

## 2017-05-13 DIAGNOSIS — S42301A Unspecified fracture of shaft of humerus, right arm, initial encounter for closed fracture: Secondary | ICD-10-CM | POA: Diagnosis present

## 2017-05-13 DIAGNOSIS — J45909 Unspecified asthma, uncomplicated: Secondary | ICD-10-CM | POA: Insufficient documentation

## 2017-05-13 DIAGNOSIS — S4421XA Injury of radial nerve at upper arm level, right arm, initial encounter: Secondary | ICD-10-CM | POA: Diagnosis not present

## 2017-05-13 DIAGNOSIS — Z91013 Allergy to seafood: Secondary | ICD-10-CM | POA: Insufficient documentation

## 2017-05-13 DIAGNOSIS — W19XXXA Unspecified fall, initial encounter: Secondary | ICD-10-CM | POA: Diagnosis not present

## 2017-05-13 HISTORY — DX: Other fracture of shaft of right humerus, initial encounter for closed fracture: S42.391A

## 2017-05-13 HISTORY — DX: Essential (primary) hypertension: I10

## 2017-05-13 HISTORY — DX: Multiple sclerosis: G35

## 2017-05-13 HISTORY — DX: Unspecified fracture of shaft of humerus, right arm, initial encounter for closed fracture: S42.301A

## 2017-05-13 MED ORDER — VITAMIN B-12 1000 MCG PO TABS: 5000.0000 ug | ORAL_TABLET | Freq: Every day | ORAL | Status: DC

## 2017-05-13 MED ORDER — METHOCARBAMOL 500 MG PO TABS
500.0000 mg | ORAL_TABLET | Freq: Four times a day (QID) | ORAL | Status: DC | PRN
  Administered 2017-05-14 (×3): 500 mg via ORAL
  Filled 2017-05-13 (×3): qty 1

## 2017-05-13 MED ORDER — HYDROMORPHONE HCL 1 MG/ML IJ SOLN
1.0000 mg | INTRAMUSCULAR | Status: DC | PRN
  Administered 2017-05-14: 1 mg via INTRAVENOUS
  Filled 2017-05-13: qty 1

## 2017-05-13 MED ORDER — BISOPROLOL FUMARATE 5 MG PO TABS
2.5000 mg | ORAL_TABLET | Freq: Every day | ORAL | Status: DC
  Filled 2017-05-13: qty 0.5

## 2017-05-13 MED ORDER — HYDROMORPHONE HCL 1 MG/ML IJ SOLN
1.0000 mg | Freq: Once | INTRAMUSCULAR | Status: AC
  Administered 2017-05-13: 1 mg via INTRAVENOUS
  Filled 2017-05-13: qty 1

## 2017-05-13 MED ORDER — LISINOPRIL 20 MG PO TABS
40.0000 mg | ORAL_TABLET | Freq: Every day | ORAL | Status: DC
Start: 2017-05-14 — End: 2017-05-14

## 2017-05-13 MED ORDER — DIMETHYL FUMARATE 240 MG PO CPDR
1.0000 | DELAYED_RELEASE_CAPSULE | Freq: Two times a day (BID) | ORAL | Status: DC
Start: 2017-05-14 — End: 2017-05-14
  Filled 2017-05-13: qty 1

## 2017-05-13 MED ORDER — ZOLPIDEM TARTRATE 5 MG PO TABS: 5.0000 mg | ORAL_TABLET | Freq: Every evening | ORAL | Status: DC | PRN

## 2017-05-13 MED ORDER — OXYCODONE-ACETAMINOPHEN 5-325 MG PO TABS
ORAL_TABLET | ORAL | Status: AC
  Filled 2017-05-13: qty 1

## 2017-05-13 MED ORDER — VITAMIN D 1000 UNITS PO TABS: 4000.0000 [IU] | ORAL_TABLET | Freq: Every day | ORAL | Status: DC

## 2017-05-13 MED ORDER — OXYCODONE-ACETAMINOPHEN 5-325 MG PO TABS
1.0000 | ORAL_TABLET | ORAL | Status: DC | PRN
  Administered 2017-05-14 (×4): 2 via ORAL
  Filled 2017-05-13 (×4): qty 2

## 2017-05-13 MED ORDER — OXYCODONE-ACETAMINOPHEN 5-325 MG PO TABS
1.0000 | ORAL_TABLET | Freq: Once | ORAL | Status: AC
Start: 2017-05-13 — End: 2017-05-13
  Administered 2017-05-13: 1 via ORAL

## 2017-05-13 MED ORDER — SODIUM CHLORIDE 0.9 % IV SOLN
INTRAVENOUS | Status: DC
  Administered 2017-05-14: via INTRAVENOUS

## 2017-05-13 MED ORDER — ONDANSETRON 4 MG PO TBDP
4.0000 mg | ORAL_TABLET | Freq: Four times a day (QID) | ORAL | Status: DC | PRN
  Filled 2017-05-13: qty 1

## 2017-05-13 MED ORDER — MORPHINE SULFATE (PF) 4 MG/ML IV SOLN
4.0000 mg | Freq: Once | INTRAVENOUS | Status: AC
  Administered 2017-05-13: 4 mg via INTRAVENOUS
  Filled 2017-05-13: qty 1

## 2017-05-13 MED ORDER — GABAPENTIN 400 MG PO CAPS
1100.0000 mg | ORAL_CAPSULE | Freq: Every day | ORAL | Status: DC
  Administered 2017-05-14: 1100 mg via ORAL
  Filled 2017-05-13: qty 1

## 2017-05-13 MED ORDER — ONDANSETRON HCL 4 MG/2ML IJ SOLN
4.0000 mg | Freq: Four times a day (QID) | INTRAMUSCULAR | Status: DC | PRN
  Administered 2017-05-14: 4 mg via INTRAVENOUS
  Filled 2017-05-13: qty 2

## 2017-05-13 NOTE — ED Triage Notes (Signed)
Pt presents to ED after having a trip and fall where she caught herself with her hands, and experienced a large pop to her upper arm.  Deformity noted.  Pt pulses and sensation intact.

## 2017-05-13 NOTE — ED Notes (Signed)
Patient transported to X-ray 

## 2017-05-13 NOTE — H&P (Signed)
Cynthia Nichols is an 56 y.o. female.    Chief Complaint: right shoulder pain s/p fall  HPI: 56 y/o female with a ground level fall earlier today after her sandal got caught on a piece of plywood while shopping for vegetables. C/o immediate pain to right shoulder with inability to lift or move her arm. 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 Denies any other injuries. Denies any LOC, dizziness or headache. Denies any previous problems with her right shoulder in the past.  PCP:  Gardner Candle, MD (Inactive)  PMH: Past Medical History:  Diagnosis Date  . Adopted    Patient has no known family history  . Asthma   . Headache(784.0)   . Hypertension   . IBS (irritable bowel syndrome)   . MS (multiple sclerosis) ([place])   . Venous aneurysm    Right side of neck    PSH: Past Surgical History:  Procedure Laterality Date  . ABDOMINAL HYSTERECTOMY    . BREAST MASS EXCISION     Bilateral breast mass excision ( Benign)    Social History:  reports that she has never smoked. She has never used smokeless tobacco. She reports that she does not drink alcohol or use drugs.  Allergies:  Allergies  Allergen Reactions  . Ampyra [Dalfampridine] Anaphylaxis and Shortness Of Breath    Chest pain, also   . Imitrex [Sumatriptan] Other (See Comments)    Chest tightness  . Peanut-Containing Drug Products Itching    Itching in the mouth (remarked that she still eats this in limited amounts)  . Shrimp [Shellfish Allergy] Itching    Itching in the mouth (remarked that she still eats this in limited amounts)    Medications: Current Facility-Administered Medications  Medication Dose Route Frequency Provider Last Rate Last Dose  . 0.9 %  sodium chloride infusion   Intravenous Continuous Cline Crock, PA-C      . Derrill Memo ON 05/14/2017] bisoprolol (ZEBETA) tablet 2.5 mg  2.5 mg Oral Daily Cline Crock, PA-C      . Derrill Memo ON  05/14/2017] cholecalciferol (VITAMIN D) tablet 4,000 Units  4,000 Units Oral Daily Cline Crock, PA-C      . Dimethyl Fumarate CPDR 240 mg  1 capsule Oral BID Cline Crock, PA-C      . gabapentin (NEURONTIN) capsule 1,100 mg  1,100 mg Oral QHS Cline Crock, PA-C      . HYDROmorphone (DILAUDID) injection 1 mg  1 mg Intravenous Q2H PRN Cline Crock, PA-C      . Derrill Memo ON 05/14/2017] lisinopril (PRINIVIL,ZESTRIL) tablet 40 mg  40 mg Oral Daily Dixon, Robb Matar, PA-C      . methocarbamol (ROBAXIN) tablet 500 mg  500 mg Oral Q6H PRN Cline Crock, PA-C      . ondansetron (ZOFRAN-ODT) disintegrating tablet 4 mg  4 mg Oral Q6H PRN Cline Crock, PA-C       Or  . ondansetron [facility]) injection 4 mg  4 mg Intravenous Q6H PRN Cline Crock, PA-C      . oxyCODONE-acetaminophen (PERCOCET/ROXICET) 5-325 MG per tablet 1-2 tablet  1-2 tablet Oral Q4H PRN Cline Crock, PA-C      . Derrill Memo ON 05/14/2017] vitamin B-12 (CYANOCOBALAMIN) tablet 5,000 mcg  5,000 mcg Oral Daily Dixon, Robb Matar, PA-C      . zolpidem (AMBIEN) tablet 5 mg  5 mg Oral QHS PRN Cline Crock, PA-C  Current Outpatient Prescriptions  Medication Sig Dispense Refill  . bisoprolol (ZEBETA) 5 MG tablet Take 2.5 mg by mouth daily.    . butalbital-acetaminophen-caffeine (FIORICET, ESGIC) 50-325-40 MG per tablet Take 1 tablet by mouth 2 (two) times daily as needed.    . cholecalciferol (VITAMIN D) 1000 UNITS tablet Take 4,000 Units by mouth daily.     . cyanocobalamin 500 MCG tablet Take 5,000 mcg by mouth daily.    . Dimethyl Fumarate 240 MG CPDR Take 1 capsule by mouth 2 (two) times daily.    Marland Kitchen gabapentin (NEURONTIN) 600 MG tablet Take 1,100 mg by mouth at bedtime.     Marland Kitchen lisinopril (PRINIVIL,ZESTRIL) 40 MG tablet Take 40 mg by mouth daily.    . Vitamin D, Ergocalciferol, (DRISDOL) 50000 units CAPS capsule Take 1 capsule (50,000 Units total) by mouth every 7  (seven) days. 12 capsule 0  . zolpidem (AMBIEN) 5 MG tablet Take 5 mg by mouth at bedtime as needed for sleep.      No results found for this or any previous visit (from the past 48 hour(s)). Dg Shoulder Right  Result Date: 05/13/2017 CLINICAL DATA:  Right upper arm deformity after fall. EXAM: RIGHT SHOULDER - 2+ VIEW COMPARISON:  None. FINDINGS: Moderately angulated and comminuted fracture of the proximal right humeral shaft is noted. No soft tissue abnormality is noted. IMPRESSION: Moderately angulated and comminuted proximal right humeral shaft fracture. Electronically Signed   By: Marijo Conception, M.D.   On: 05/13/2017 19:17   Dg Humerus Right  Result Date: 05/13/2017 CLINICAL DATA:  Right upper arm deformity after fall. EXAM: RIGHT HUMERUS - 2+ VIEW COMPARISON:  None. FINDINGS: Moderately angulated and comminuted fracture is seen involving the proximal right humeral shaft. No soft tissue abnormality is noted. IMPRESSION: Moderately angulated and comminuted proximal right humeral shaft fracture. Electronically Signed   By: Marijo Conception, M.D.   On: 05/13/2017 19:16    ROS: ROS Unable to move right arm due to pain Unable to make a fist or lift her wrist  Physical Exam: Alert and appropriate 56 y/o female in no acute distress Pt is right hand dominant Cervical spine with full rom, no tenderness or deformity Left upper extremity with full rom, no tenderness or deformity Right upper extremity: moderate edema and mild deformity to right shoulder/humerus Sensation is intact distally but unable to make a fist or extend her wrist suggesting radial nerve palsy, involvement with known fracture No taken thru any rom due to known fracture Hips nontender and stable Bilateral lower extremities with small abrasion to right foot otherwise negative Physical Exam   Assessment/Plan Assessment: right comminuted humerus fracture  Plan: Pt had slight reduction and coaptation splint placed in the  emergency department with improvement of radial nerve symptoms. Able to make a fist and sensation distal right hand improved.  Admit overnight and recheck her nerve in the morning Surgery for ORIF and radial nerve exploration may be needed if nerve continues to have a deficit Pain management NPO after midnight   I have seen and examined this patient and agree with the above assessment and plan.  Will consult with Dr Laurelyn Sickle concerning appropriate timing of surgical intervention for the radial nerve and the humerus fracture.

## 2017-05-13 NOTE — ED Notes (Signed)
Attempted PIV x2 on L arm.  Pt sts she is a hard stick and she hasn't been drinking very much fluids today.  Veins were quite flat.  Another RN consulted.

## 2017-05-13 NOTE — ED Provider Notes (Signed)
[facility] DEPT Provider Note   CSN: [id number] Arrival date & time: 05/13/17  1824   History   Chief Complaint Chief Complaint  Patient presents with  . Arm Pain    HPI Cynthia Nichols is a 56 y.o. female.  Presented with severe right arm pain following a mechanical fall produced stand. She is a past mental history significant for asthma hypertension irritable bowel syndrome and multiple sclerosis. Patient stated that she was at a produce stand when she tripped over a wooden plank on the ground causing her to fall. While falling she attempted to arrest the fall with her right arm by stretching it outward toward a table. Upon standing, she noted significant pain in the right upper arm. She denied additional injury to her face, hands, legs, knees, head or any other known injury. Patient stated that she has not had anything to eat since 10:30 AM this morning. Patient denied issues with balance prior to the fall, dizziness, loss of vision, syncope or weakness. She insisted that she remained conscious throughout the event, before, during and after. Patients spouse was present in the room who attested to the event with the exception that he did not view the initial fall, only the after she had fallen as he was waiting in the car off the side of the road near the vegetable stand. As per husband, witness accounts collaborated her account of the event.      Past Medical History:  Diagnosis Date  . Adopted    Patient has no known family history  . Asthma   . Headache(784.0)   . Hypertension   . IBS (irritable bowel syndrome)   . MS (multiple sclerosis) ([place])   . Venous aneurysm    Right side of neck    Patient Active Problem List   Diagnosis Date Noted  . Closed fracture of shaft of right humerus 05/13/2017  . Other fracture of shaft of right humerus, initial encounter for closed fracture 05/13/2017  . Contusion of elbow and forearm, initial encounter 08/26/2016  . Acute upper  respiratory infection 12/08/2015  . 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  . Nonallopathic lesion of sacral region 10/23/2015  . Nonallopathic lesion of pelvic region 10/23/2015  . Nonallopathic lesion of lumbosacral region 10/23/2015  . Fracture of fifth metatarsal bone 08/13/2015  . Patella fracture 07/03/2015  . Palpitations 06/23/2012  . Mixed hyperlipidemia 06/23/2012  . MS (multiple sclerosis) ([place]) 06/23/2012  . Venous aneurysm   . IBS (irritable bowel syndrome)   . Unspecified venous (peripheral) insufficiency 11/09/2011  . 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 Leg pain 10/26/2011    Past Surgical History:  Procedure Laterality Date  . ABDOMINAL HYSTERECTOMY    . BREAST MASS EXCISION     Bilateral breast mass excision ( [place])    OB History    No data available       Home Medications    Prior to Admission medications   Medication Sig Start Date End Date Taking? Authorizing Provider  bisoprolol (ZEBETA) 5 MG tablet Take 2.5 mg by mouth daily.    [provider]  butalbital-acetaminophen-caffeine (FIORICET, ESGIC) 50-325-40 MG per tablet Take 1 tablet by mouth 2 (two) times daily as needed.    [provider]  cholecalciferol (VITAMIN D) 1000 UNITS tablet Take 4,000 Units by mouth daily.     [provider]  cyanocobalamin 500 MCG tablet Take 5,000 mcg by mouth daily.    [provider]  Dimethyl Fumarate 240 MG CPDR Take 1 capsule by mouth 2 (two) times daily.    [provider]  gabapentin (NEURONTIN) 600 MG tablet Take 1,100 mg by mouth at bedtime.     [provider]  lisinopril (PRINIVIL,ZESTRIL) 40 MG tablet Take 40 mg by mouth daily.    [provider]  methocarbamol (ROBAXIN) 500 MG tablet Take 1 tablet (500 mg total) by mouth 3 (three) times daily as needed. 05/14/17   Cynthia Cedars, MD  ondansetron (ZOFRAN) 4 MG tablet Take 1 tablet (4 mg total) by mouth every 8 (eight) hours as  needed for nausea or vomiting. 05/14/17   Cynthia Cedars, MD  oxyCODONE-acetaminophen (PERCOCET/ROXICET) 5-325 MG tablet Take 1-2 tablets by mouth every 4 (four) hours as needed for moderate pain. 05/14/17   Cynthia Cedars, MD  Vitamin D, Ergocalciferol, (DRISDOL) 50000 units CAPS capsule Take 1 capsule (50,000 Units total) by mouth every 7 (seven) days. 02/07/17   Lyndal Pulley, DO  zolpidem (AMBIEN) 5 MG tablet Take 5 mg by mouth at bedtime as needed for sleep.    [provider]    Family History History reviewed. No pertinent family history.  Social History Social History  Substance Use Topics  . Smoking status: Never Smoker  . Smokeless tobacco: Never Used  . Alcohol use No     Allergies   Ampyra [dalfampridine]; Imitrex [sumatriptan]; Peanut-containing drug products; and Shrimp [shellfish allergy]   Review of Systems Review of Systems  Constitutional: Negative for chills and fever.  HENT: Negative for ear pain and sore throat.   Eyes: Negative for pain and visual disturbance.  Respiratory: Negative for cough and shortness of breath.   Cardiovascular: Negative for chest pain and palpitations.  Gastrointestinal: Positive for nausea. Negative for abdominal pain and vomiting.  Genitourinary: Negative for dysuria and hematuria.  Musculoskeletal: Negative for arthralgias and back pain.       Severe pain in the right proximal arm secondary to a fall. She denied being able to use this arm.  Skin: Negative for color change and rash.  Neurological: Negative for dizziness, seizures and syncope.  Hematological: Does not bruise/bleed easily.  All other systems reviewed and are negative.    Physical Exam Updated Vital Signs BP 125/85 (BP Location: Left Arm)   Pulse 82   Temp 98.1 F (36.7 C)   Resp 16   SpO2 98%   Physical Exam  Constitutional: She is oriented to person, place, and time. She appears well-developed and well-nourished. No distress.  HENT:  Head:  Normocephalic and atraumatic.  Eyes: Conjunctivae are normal.  Neck: Neck supple.  Cardiovascular: Normal rate and regular rhythm.   No murmur heard. Pulmonary/Chest: Effort normal and breath sounds normal. No respiratory distress.  Abdominal: Soft. There is no tenderness.  Musculoskeletal: She exhibits tenderness (Tenderness of the right proximal arm between the shoulder and the elbow. Patient is unable to extend her wrist. Radial pulse intact. ) and deformity (Right upper extremity appears significantly enlarged compared to the left). She exhibits no edema.  Neurological: She is alert and oriented to person, place, and time. She displays normal reflexes. She exhibits normal muscle tone.  Patient states that the dorsal aspect of her hand feels numb/tingles when touched. She added that the palmer aspect feels normal and equal to the left.  Skin: Skin is warm and dry.  Psychiatric: She has a normal mood and affect.  Nursing note and vitals reviewed.    ED Treatments / Results  Labs (all labs ordered are listed, but only abnormal results are displayed) Labs Reviewed  BASIC METABOLIC PANEL - Abnormal; Notable for the following:       Result Value   CO2 21 (*)    Glucose, Bld 167 (*)    All other components within normal limits  CBC - Abnormal; Notable for the following:    Platelets 46 (*)    All other components within normal limits  APTT  PROTIME-INR  HIV ANTIBODY (ROUTINE TESTING)    EKG  EKG Interpretation None       Radiology Dg Shoulder Right  Result Date: 05/13/2017 CLINICAL DATA:  Right upper arm deformity after fall. EXAM: RIGHT SHOULDER - 2+ VIEW COMPARISON:  None. FINDINGS: Moderately angulated and comminuted fracture of the proximal right humeral shaft is noted. No soft tissue abnormality is noted. IMPRESSION: Moderately angulated and comminuted proximal right humeral shaft fracture. Electronically Signed   By: Marijo Conception, M.D.   On: 05/13/2017 19:17   Dg  Humerus Right  Result Date: 05/13/2017 CLINICAL DATA:  Fracture, postreduction. EXAM: RIGHT HUMERUS - 2+ VIEW COMPARISON:  Pre reduction exam earlier this day. FINDINGS: Improved alignment of the comminuted displaced angulated mid proximal humeral fracture postreduction. Mild persistent apex medial angulation persists. Overlying splint material obscures osseous and soft tissue fine detail. IMPRESSION: Improved alignment of the comminuted displaced humeral fracture postreduction. Electronically Signed   By: Jeb Levering M.D.   On: 05/13/2017 23:56   Dg Humerus Right  Result Date: 05/13/2017 CLINICAL DATA:  Right upper arm deformity after fall. EXAM: RIGHT HUMERUS - 2+ VIEW COMPARISON:  None. FINDINGS: Moderately angulated and comminuted fracture is seen involving the proximal right humeral shaft. No soft tissue abnormality is noted. IMPRESSION: Moderately angulated and comminuted proximal right humeral shaft fracture. Electronically Signed   By: Marijo Conception, M.D.   On: 05/13/2017 19:16    Procedures Procedures (including critical care time)  Medications Ordered in ED Medications  vitamin B-12 (CYANOCOBALAMIN) tablet 5,000 mcg (not administered)  gabapentin (NEURONTIN) capsule 1,100 mg (1,100 mg Oral Given 05/14/17 0023)  bisoprolol (ZEBETA) tablet 2.5 mg (not administered)  Dimethyl Fumarate CPDR 240 mg (not administered)  lisinopril (PRINIVIL,ZESTRIL) tablet 40 mg (not administered)  zolpidem (AMBIEN) tablet 5 mg (not administered)  cholecalciferol (VITAMIN D) tablet 4,000 Units (not administered)  0.9 %  sodium chloride infusion ( Intravenous New Bag/Given 05/14/17 0019)  HYDROmorphone (DILAUDID) injection 1 mg (1 mg Intravenous Given 05/14/17 0225)  methocarbamol (ROBAXIN) tablet 500 mg (500 mg Oral Given 05/14/17 1228)  oxyCODONE-acetaminophen (PERCOCET/ROXICET) 5-325 MG per tablet 1-2 tablet (2 tablets Oral Given 05/14/17 1228)  ondansetron (ZOFRAN-ODT) disintegrating tablet 4 mg (  Oral See Alternative 05/14/17 0345)    Or  ondansetron (ZOFRAN) injection 4 mg (4 mg Intravenous Given 05/14/17 0345)  scopolamine (TRANSDERM-SCOP) 1 MG/3DAYS 1.5 mg (1.5 mg Transdermal Patch Applied 05/14/17 0954)  oxyCODONE-acetaminophen (PERCOCET/ROXICET) 5-325 MG per tablet 1 tablet (1 tablet Oral Given 05/13/17 1835)  morphine 4 MG/ML injection 4 mg (4 mg Intravenous Given 05/13/17 2103)  HYDROmorphone (DILAUDID) injection 1 mg (1 mg Intravenous Given 05/13/17 2214)     Initial Impression / Assessment and Plan / ED Course  I have reviewed the triage vital signs and the nursing notes.  Pertinent labs & imaging results that were available during my care of the patient were reviewed by me and considered in my medical decision making (see chart for details).    Given the presentation of acute  onset right arm pain following a fall, the patient is most likely suffering from as musculoskeletal injury. Based on the finding of radial nerve damage with inability to extend the wrist as well as decreased sensation of the radial sensory innervation of dorsal aspect of the right hand, she is most likley suffering from a humeral fracture with subsequent radial nerve damage. This finding is confirmed with imaging of the arm/shoulder which demonstrated a partially angulated, comminuted fracture of the right humerus. Given the inability to extend the wrist and alteration in sensation, this injury is of significant concern and warrants orthopedic consultation.   8:20 PM, Patient given morphine 4mg  for pain while awaiting further evaluation.   8:50 PM, Consult placed to Orthopedic surgery.  9:27 PM, Ortho returned call. Currently in surgery, will review records and notify ED team of findings when complete. We will continue to monitor her until such time.   10:20 PM, Orthopedics will see and admit patient to their service for possible surgery in the morning if she fails to recover with splinting.   10:22 PM,  Orthopedic Physician evaluated patient and agreed with the finding of radial nerve damage. Will admit patient.   Patient seen and evaluated with Dr. Sherry Ruffing.   Final Clinical Impressions(s) / ED Diagnoses   Final diagnoses:  Closed displaced comminuted fracture of shaft of right humerus, initial encounter    New Prescriptions Discharge Medication List as of 05/14/2017 11:42 AM    START taking these medications   Details  methocarbamol (ROBAXIN) 500 MG tablet Take 1 tablet (500 mg total) by mouth 3 (three) times daily as needed., Starting Sat 05/14/2017, Print    ondansetron (ZOFRAN) 4 MG tablet Take 1 tablet (4 mg total) by mouth every 8 (eight) hours as needed for nausea or vomiting., Starting Sat 05/14/2017, Print    oxyCODONE-acetaminophen (PERCOCET/ROXICET) 5-325 MG tablet Take 1-2 tablets by mouth every 4 (four) hours as needed for moderate pain., Starting Sat 05/14/2017, Print         Kathi Ludwig, MD 05/14/17 1448    Tegeler, Gwenyth Allegra, MD 05/16/17 1041

## 2017-05-13 NOTE — Progress Notes (Signed)
Orthopedic Tech Progress Note Patient Details:  Cynthia Nichols 06-23-1961 [id number]  Ortho Devices Type of Ortho Device: Ace wrap, Coapt, Arm sling Ortho Device/Splint Location: RUE Ortho Device/Splint Interventions: Ordered, Application   Braulio Bosch 05/13/2017, 10:46 PM

## 2017-05-13 NOTE — ED Notes (Signed)
Pt's husband updated on delays and expected rooming, provided with warm blanket for patient

## 2017-05-13 NOTE — Progress Notes (Signed)
Orthopedic Tech Progress Note Patient Details:  Cynthia Nichols [birth year] [id number]  Ortho Devices Type of Ortho Device: Post (long arm) splint Ortho Device/Splint Location: RUE Ortho Device/Splint Interventions: Ordered, Application   Braulio Bosch 05/13/2017, 10:48 PM

## 2017-05-14 DIAGNOSIS — G35 Multiple sclerosis: Secondary | ICD-10-CM | POA: Diagnosis present

## 2017-05-14 DIAGNOSIS — S5421XA Injury of radial nerve at forearm level, right arm, initial encounter: Secondary | ICD-10-CM | POA: Diagnosis present

## 2017-05-14 DIAGNOSIS — E782 Mixed hyperlipidemia: Secondary | ICD-10-CM | POA: Diagnosis present

## 2017-05-14 DIAGNOSIS — Z79899 Other long term (current) drug therapy: Secondary | ICD-10-CM | POA: Diagnosis not present

## 2017-05-14 DIAGNOSIS — S42353A Displaced comminuted fracture of shaft of humerus, unspecified arm, initial encounter for closed fracture: Secondary | ICD-10-CM | POA: Diagnosis not present

## 2017-05-14 DIAGNOSIS — W1830XA Fall on same level, unspecified, initial encounter: Secondary | ICD-10-CM | POA: Diagnosis present

## 2017-05-14 DIAGNOSIS — I10 Essential (primary) hypertension: Secondary | ICD-10-CM | POA: Diagnosis not present

## 2017-05-14 DIAGNOSIS — Z888 Allergy status to other drugs, medicaments and biological substances status: Secondary | ICD-10-CM | POA: Diagnosis not present

## 2017-05-14 DIAGNOSIS — S42351A Displaced comminuted fracture of shaft of humerus, right arm, initial encounter for closed fracture: Secondary | ICD-10-CM | POA: Diagnosis not present

## 2017-05-14 DIAGNOSIS — S4431XA Injury of axillary nerve, right arm, initial encounter: Secondary | ICD-10-CM | POA: Diagnosis not present

## 2017-05-14 DIAGNOSIS — J45909 Unspecified asthma, uncomplicated: Secondary | ICD-10-CM | POA: Diagnosis present

## 2017-05-14 DIAGNOSIS — S4421XA Injury of radial nerve at upper arm level, right arm, initial encounter: Secondary | ICD-10-CM | POA: Diagnosis not present

## 2017-05-14 DIAGNOSIS — W19XXXA Unspecified fall, initial encounter: Secondary | ICD-10-CM | POA: Diagnosis not present

## 2017-05-14 LAB — BASIC METABOLIC PANEL
Anion gap: 9 (ref 5–15)
BUN: 16 mg/dL (ref 6–20)
CALCIUM: 9.3 mg/dL (ref 8.9–10.3)
CHLORIDE: 109 mmol/L (ref 101–111)
CO2: 21 mmol/L — AB (ref 22–32)
CREATININE: 0.82 mg/dL (ref 0.44–1.00)
GFR calc non Af Amer: >60 mL/min (ref >60)
Glucose, Bld: 167 mg/dL — ABNORMAL HIGH (ref 65–99)
Potassium: 4 mmol/L (ref 3.5–5.1)
Sodium: 139 mmol/L (ref 135–145)

## 2017-05-14 LAB — CBC
HCT: 36.3 % (ref 36.0–46.0)
Hemoglobin: 12.3 g/dL (ref 12.0–15.0)
MCH: 31.1 pg (ref 26.0–34.0)
MCHC: 33.9 g/dL (ref 30.0–36.0)
MCV: 91.9 fL (ref 78.0–100.0)
PLATELETS: 46 10*3/uL — AB (ref 150–400)
RBC: 3.95 MIL/uL (ref 3.87–5.11)
RDW: 13 % (ref 11.5–15.5)
WBC: 8.2 10*3/uL (ref 4.0–10.5)

## 2017-05-14 LAB — HIV ANTIBODY (ROUTINE TESTING W REFLEX): HIV Screen 4th Generation wRfx: NONREACTIVE

## 2017-05-14 LAB — PROTIME-INR
INR: 1.02
PROTHROMBIN TIME: 13.4 s (ref 11.4–15.2)

## 2017-05-14 LAB — APTT: APTT: 30 s (ref 24–36)

## 2017-05-14 MED ORDER — OXYCODONE-ACETAMINOPHEN 5-325 MG PO TABS: 1.0000 | ORAL_TABLET | ORAL | 0 refills | Status: DC | PRN

## 2017-05-14 MED ORDER — METHOCARBAMOL 500 MG PO TABS: 500.0000 mg | ORAL_TABLET | Freq: Three times a day (TID) | ORAL | 1 refills | Status: DC | PRN

## 2017-05-14 MED ORDER — SCOPOLAMINE 1 MG/3DAYS TD PT72
1.0000 | MEDICATED_PATCH | TRANSDERMAL | Status: DC
  Administered 2017-05-14: 1.5 mg via TRANSDERMAL
  Filled 2017-05-14 (×2): qty 1

## 2017-05-14 MED ORDER — ONDANSETRON HCL 4 MG PO TABS: 4.0000 mg | ORAL_TABLET | Freq: Three times a day (TID) | ORAL | 0 refills | Status: DC | PRN

## 2017-05-14 NOTE — Discharge Summary (Signed)
Physician Discharge Summary   Patient ID: Cynthia Nichols MRN: [id number] DOB/AGE: [birth year] 56 y.o.  Admit date: 05/13/2017 Discharge date: 05/14/2017  Admission Diagnoses:  Active Problems:   Closed fracture of shaft of right humerus   Other fracture of shaft of right humerus, initial encounter for closed fracture   Discharge Diagnoses:  Same   Surgeries:  on    Consultants: none  Discharged Condition: Stable  Hospital Course: Cynthia Nichols is an 56 y.o. female who was admitted 05/13/2017 with a chief complaint of  Chief Complaint  Patient presents with  . Arm Pain  , and found to have a diagnosis of comminuted and displaced right humeral shaft fracture with radial nerve deficit.  The patient was admitted for observation and pain control.  Surgical planning discussed for the coming week. The patient had an uncomplicated hospital course and was stable for discharge.  Recent vital signs:  Vitals:   05/14/17 0005 05/14/17 0603  BP: 137/78 130/66  Pulse: 63 67  Resp:    Temp: 98.4 F (36.9 C) 98.1 F (36.7 C)  SpO2: 96% 100%    Recent laboratory studies:  Results for orders placed or performed during the hospital encounter of 41/66/06  Basic metabolic panel  Result Value Ref Range   Sodium 139 135 - 145 mmol/L   Potassium 4.0 3.5 - 5.1 mmol/L   Chloride 109 101 - 111 mmol/L   CO2 21 (L) 22 - 32 mmol/L   Glucose, Bld 167 (H) 65 - 99 mg/dL   BUN 16 6 - 20 mg/dL   Creatinine, Ser 0.82 0.44 - 1.00 mg/dL   Calcium 9.3 8.9 - 10.3 mg/dL   GFR calc non Af Amer >60 >60 mL/min   GFR calc Af Amer >60 >60 mL/min   Anion gap 9 5 - 15  CBC  Result Value Ref Range   WBC 8.2 4.0 - 10.5 K/uL   RBC 3.95 3.87 - 5.11 MIL/uL   Hemoglobin 12.3 12.0 - 15.0 g/dL   HCT 36.3 36.0 - 46.0 %   MCV 91.9 78.0 - 100.0 fL   MCH 31.1 26.0 - 34.0 pg   MCHC 33.9 30.0 - 36.0 g/dL   RDW 13.0 11.5 - 15.5 %   Platelets 46 (L) 150 - 400 K/uL  APTT  Result Value Ref Range   aPTT 30 24 - 36  seconds  Protime-INR  Result Value Ref Range   Prothrombin Time 13.4 11.4 - 15.2 seconds   INR 1.02     Discharge Medications:   Allergies as of 05/14/2017      Reactions   Ampyra [dalfampridine] Anaphylaxis, Shortness Of Breath   Chest pain, also    Imitrex [sumatriptan] Other (See Comments)   Chest tightness   Peanut-containing Drug Products Itching   Itching in the mouth (remarked that she still eats this in limited amounts)   Shrimp [shellfish Allergy] Itching   Itching in the mouth (remarked that she still eats this in limited amounts)      Medication List    TAKE these medications   bisoprolol 5 MG tablet Commonly known as:  ZEBETA Take 2.5 mg by mouth daily.   butalbital-acetaminophen-caffeine 50-325-40 MG tablet Commonly known as:  FIORICET, ESGIC Take 1 tablet by mouth 2 (two) times daily as needed.   cholecalciferol 1000 units tablet Commonly known as:  VITAMIN D Take 4,000 Units by mouth daily.   cyanocobalamin 500 MCG tablet Take 5,000 mcg by mouth daily.  Dimethyl Fumarate 240 MG Cpdr Take 1 capsule by mouth 2 (two) times daily.   gabapentin 600 MG tablet Commonly known as:  NEURONTIN Take 1,100 mg by mouth at bedtime.   lisinopril 40 MG tablet Commonly known as:  PRINIVIL,ZESTRIL Take 40 mg by mouth daily.   methocarbamol 500 MG tablet Commonly known as:  ROBAXIN Take 1 tablet (500 mg total) by mouth 3 (three) times daily as needed.   ondansetron 4 MG tablet Commonly known as:  ZOFRAN Take 1 tablet (4 mg total) by mouth every 8 (eight) hours as needed for nausea or vomiting.   oxyCODONE-acetaminophen 5-325 MG tablet Commonly known as:  PERCOCET/ROXICET Take 1-2 tablets by mouth every 4 (four) hours as needed for moderate pain.   Vitamin D (Ergocalciferol) 50000 units Caps capsule Commonly known as:  DRISDOL Take 1 capsule (50,000 Units total) by mouth every 7 (seven) days.   zolpidem 5 MG tablet Commonly known as:  AMBIEN Take 5 mg by  mouth at bedtime as needed for sleep.            Discharge Care Instructions        Start     Ordered   05/14/17 0000  oxyCODONE-acetaminophen (PERCOCET/ROXICET) 5-325 MG tablet  Every 4 hours PRN     05/14/17 1035   05/14/17 0000  methocarbamol (ROBAXIN) 500 MG tablet  3 times daily PRN     05/14/17 1035   05/14/17 0000  ondansetron (ZOFRAN) 4 MG tablet  Every 8 hours PRN     05/14/17 1035   05/14/17 0000  Call MD / Call 911    Comments:  If you experience chest pain or shortness of breath, CALL 911 and be transported to the hospital emergency room.  If you develope a fever above 101 F, pus (white drainage) or increased drainage or redness at the wound, or calf pain, call your surgeon's office.   05/14/17 1035   05/14/17 0000  Diet - low sodium heart healthy     05/14/17 1035   05/14/17 0000  Constipation Prevention    Comments:  Drink plenty of fluids.  Prune juice may be helpful.  You may use a stool softener, such as Colace (over the counter) 100 mg twice a day.  Use MiraLax (over the counter) for constipation as needed.   05/14/17 1035   05/14/17 0000  Increase activity slowly as tolerated     05/14/17 1035      Diagnostic Studies: Dg Shoulder Right  Result Date: 05/13/2017 CLINICAL DATA:  Right upper arm deformity after fall. EXAM: RIGHT SHOULDER - 2+ VIEW COMPARISON:  None. FINDINGS: Moderately angulated and comminuted fracture of the proximal right humeral shaft is noted. No soft tissue abnormality is noted. IMPRESSION: Moderately angulated and comminuted proximal right humeral shaft fracture. Electronically Signed   By: Marijo Conception, M.D.   On: 05/13/2017 19:17   Dg Humerus Right  Result Date: 05/13/2017 CLINICAL DATA:  Fracture, postreduction. EXAM: RIGHT HUMERUS - 2+ VIEW COMPARISON:  Pre reduction exam earlier this day. FINDINGS: Improved alignment of the comminuted displaced angulated mid proximal humeral fracture postreduction. Mild persistent apex medial  angulation persists. Overlying splint material obscures osseous and soft tissue fine detail. IMPRESSION: Improved alignment of the comminuted displaced humeral fracture postreduction. Electronically Signed   By: Jeb Levering M.D.   On: 05/13/2017 23:56   Dg Humerus Right  Result Date: 05/13/2017 CLINICAL DATA:  Right upper arm deformity after fall. EXAM: RIGHT HUMERUS -  2+ VIEW COMPARISON:  None. FINDINGS: Moderately angulated and comminuted fracture is seen involving the proximal right humeral shaft. No soft tissue abnormality is noted. IMPRESSION: Moderately angulated and comminuted proximal right humeral shaft fracture. Electronically Signed   By: Marijo Conception, M.D.   On: 05/13/2017 19:16    Disposition: home  Discharge Instructions    Call MD / Call 911    Complete by:  As directed    If you experience chest pain or shortness of breath, CALL 911 and be transported to the hospital emergency room.  If you develope a fever above 101 F, pus (white drainage) or increased drainage or redness at the wound, or calf pain, call your surgeon's office.   Constipation Prevention    Complete by:  As directed    Drink plenty of fluids.  Prune juice may be helpful.  You may use a stool softener, such as Colace (over the counter) 100 mg twice a day.  Use MiraLax (over the counter) for constipation as needed.   Diet - low sodium heart healthy    Complete by:  As directed    Increase activity slowly as tolerated    Complete by:  As directed       No use of the right arm, rest and elevate as you can   Signed: Abdel Effinger,STEVEN R 05/14/2017, 10:35 AM

## 2017-05-14 NOTE — Discharge Instructions (Signed)
Please rest the right arm and wiggle fingers as you can.  Call with any changes or concerns.[phone]  Dr Veverly Fells to call with follow up planning.

## 2017-05-14 NOTE — Progress Notes (Signed)
D/c to home with family. Dressing CDI pain 8/10 medication given po. VSS. Rx and d/c instructions given and explained. Home meds returned from pharmacy and given to patient

## 2017-05-14 NOTE — Progress Notes (Signed)
Orthopedics Progress Note  Subjective: Patient reports ongoing pain in the right arm and no return of function in the right hand radial nerve.   Objective:  Vitals:   05/14/17 0005 05/14/17 0603  BP: 137/78 130/66  Pulse: 63 67  Resp:    Temp: 98.4 F (36.9 C) 98.1 F (36.7 C)  SpO2: 96% 100%    General: Awake and alert  Musculoskeletal: right UE immobilized in coaptation splint with posterior splint augmentation. No evidence of hand swelling, fingers well perfused.  No radial nerve function at this time.  Her axillary nerve sensation is intat Neurovascularly intact  Lab Results  Component Value Date   WBC 8.2 05/14/2017   HGB 12.3 05/14/2017   HCT 36.3 05/14/2017   MCV 91.9 05/14/2017   PLT 46 (L) 05/14/2017       Component Value Date/Time   NA 139 05/14/2017 0544   K 4.0 05/14/2017 0544   CL 109 05/14/2017 0544   CO2 21 (L) 05/14/2017 0544   GLUCOSE 167 (H) 05/14/2017 0544   BUN 16 05/14/2017 0544   CREATININE 0.82 05/14/2017 0544   CALCIUM 9.3 05/14/2017 0544   GFRNONAA >60 05/14/2017 0544   GFRAA >60 05/14/2017 0544    Lab Results  Component Value Date   INR 1.02 05/14/2017    Assessment/Plan: s/p Ground level fall with comminuted humeral shaft fracture with radial nerve deficit.  Discussed this case in detail with Dr Ramonita Lab and Dr Laurelyn Sickle and there is consensus that the patient will require ORIF of the humeral fracture and also exploration of the radial nerve but that this should be done in controlled setting with both shoulder and elbow specialists present.  We will coordinate our schedules and get this planned for this week as early as possible.  I discussed this with the patient and her husband who agree with this plan.  She is safe for discharge home.  NPO order cancelled and a diet ordered. D/C today   Doran Heater. Veverly Fells, MD 05/14/2017 10:13 AM

## 2017-05-16 ENCOUNTER — Encounter (HOSPITAL_COMMUNITY): Payer: Self-pay | Admitting: *Deleted

## 2017-05-16 NOTE — Progress Notes (Signed)
Pt denies SOB, chest pain, and being under the care of a cardiologist. Pt sated that a stress test was performed >10 years ago but denies having an echo and cardiac cath. Pt denies having a chest x ray and EKG within the last year. Pt stated that she does not take Aspirin. Pt made aware to stop taking vitamins, fish oil, Cranberry, Phenylephrine and herbal medications. Do not take any NSAIDs ie: Ibuprofen, Advil, Naproxen (Aleve), Motrin, Advil, BC and Goody Powder or any medication containing Aspirin. Pt verbalized understanding of all pre-op instructions.

## 2017-05-17 ENCOUNTER — Inpatient Hospital Stay (HOSPITAL_COMMUNITY)
Admission: AD | Admit: 2017-05-17 | Discharge: 2017-05-18 | DRG: 494 | Disposition: A | Discharge status: Home / Self Care | Payer: 59 | Source: Ambulatory Visit | Attending: Orthopedic Surgery | Admitting: Orthopedic Surgery

## 2017-05-17 ENCOUNTER — Ambulatory Visit (HOSPITAL_COMMUNITY): Payer: 59 | Admitting: Certified Registered"

## 2017-05-17 ENCOUNTER — Other Ambulatory Visit: Payer: Self-pay

## 2017-05-17 ENCOUNTER — Encounter (HOSPITAL_COMMUNITY): Payer: Self-pay | Admitting: Orthopedic Surgery

## 2017-05-17 ENCOUNTER — Encounter (HOSPITAL_COMMUNITY)
Admission: AD | Disposition: A | Discharge status: Home / Self Care | Payer: Self-pay | Source: Ambulatory Visit | Attending: Orthopedic Surgery

## 2017-05-17 DIAGNOSIS — G8918 Other acute postprocedural pain: Secondary | ICD-10-CM | POA: Diagnosis not present

## 2017-05-17 DIAGNOSIS — S42351A Displaced comminuted fracture of shaft of humerus, right arm, initial encounter for closed fracture: Secondary | ICD-10-CM | POA: Diagnosis not present

## 2017-05-17 DIAGNOSIS — S5421XA Injury of radial nerve at forearm level, right arm, initial encounter: Secondary | ICD-10-CM | POA: Diagnosis present

## 2017-05-17 DIAGNOSIS — Z888 Allergy status to other drugs, medicaments and biological substances status: Secondary | ICD-10-CM | POA: Diagnosis not present

## 2017-05-17 DIAGNOSIS — W1830XA Fall on same level, unspecified, initial encounter: Secondary | ICD-10-CM | POA: Diagnosis present

## 2017-05-17 DIAGNOSIS — G35 Multiple sclerosis: Secondary | ICD-10-CM | POA: Diagnosis present

## 2017-05-17 DIAGNOSIS — S42309A Unspecified fracture of shaft of humerus, unspecified arm, initial encounter for closed fracture: Secondary | ICD-10-CM | POA: Diagnosis present

## 2017-05-17 DIAGNOSIS — E782 Mixed hyperlipidemia: Secondary | ICD-10-CM | POA: Diagnosis present

## 2017-05-17 DIAGNOSIS — I10 Essential (primary) hypertension: Secondary | ICD-10-CM | POA: Diagnosis not present

## 2017-05-17 DIAGNOSIS — S42353A Displaced comminuted fracture of shaft of humerus, unspecified arm, initial encounter for closed fracture: Secondary | ICD-10-CM | POA: Diagnosis present

## 2017-05-17 DIAGNOSIS — Z79899 Other long term (current) drug therapy: Secondary | ICD-10-CM | POA: Diagnosis not present

## 2017-05-17 DIAGNOSIS — M25511 Pain in right shoulder: Secondary | ICD-10-CM | POA: Diagnosis present

## 2017-05-17 DIAGNOSIS — J45909 Unspecified asthma, uncomplicated: Secondary | ICD-10-CM | POA: Diagnosis present

## 2017-05-17 DIAGNOSIS — S4421XA Injury of radial nerve at upper arm level, right arm, initial encounter: Secondary | ICD-10-CM | POA: Diagnosis not present

## 2017-05-17 HISTORY — DX: Other specified postprocedural states: R11.2

## 2017-05-17 HISTORY — PX: TENNIS ELBOW RELEASE/NIRSCHEL PROCEDURE: SHX6651

## 2017-05-17 HISTORY — DX: Unspecified fracture of shaft of humerus, unspecified arm, initial encounter for closed fracture: S42.309A

## 2017-05-17 HISTORY — PX: ORIF HUMERUS FRACTURE: SHX2126

## 2017-05-17 HISTORY — DX: Other specified postprocedural states: Z98.890

## 2017-05-17 LAB — CBC
HCT: 35.4 % — ABNORMAL LOW (ref 36.0–46.0)
Hemoglobin: 11.7 g/dL — ABNORMAL LOW (ref 12.0–15.0)
MCH: 30.6 pg (ref 26.0–34.0)
MCHC: 33.1 g/dL (ref 30.0–36.0)
MCV: 92.7 fL (ref 78.0–100.0)
Platelets: 196 K/uL (ref 150–400)
RBC: 3.82 MIL/uL — ABNORMAL LOW (ref 3.87–5.11)
RDW: 12.8 % (ref 11.5–15.5)
WBC: 5.6 K/uL (ref 4.0–10.5)

## 2017-05-17 SURGERY — OPEN REDUCTION INTERNAL FIXATION (ORIF) PROXIMAL HUMERUS FRACTURE
Anesthesia: General | Site: Arm Upper | Laterality: Right

## 2017-05-17 MED ORDER — PROPOFOL 10 MG/ML IV BOLUS
INTRAVENOUS | Status: AC
  Filled 2017-05-17: qty 20

## 2017-05-17 MED ORDER — LIDOCAINE 2% (20 MG/ML) 5 ML SYRINGE
INTRAMUSCULAR | Status: AC
  Filled 2017-05-17: qty 5

## 2017-05-17 MED ORDER — DEXAMETHASONE SODIUM PHOSPHATE 10 MG/ML IJ SOLN
INTRAMUSCULAR | Status: AC
  Filled 2017-05-17: qty 1

## 2017-05-17 MED ORDER — METHOCARBAMOL 1000 MG/10ML IJ SOLN: 500.0000 mg | Freq: Four times a day (QID) | INTRAVENOUS | Status: DC | PRN

## 2017-05-17 MED ORDER — FENTANYL CITRATE (PF) 250 MCG/5ML IJ SOLN
INTRAMUSCULAR | Status: AC
  Filled 2017-05-17: qty 5

## 2017-05-17 MED ORDER — MORPHINE SULFATE (PF) 4 MG/ML IV SOLN: 1.0000 mg | INTRAVENOUS | Status: DC | PRN

## 2017-05-17 MED ORDER — CHLORHEXIDINE GLUCONATE 4 % EX LIQD: 60.0000 mL | Freq: Once | CUTANEOUS | Status: DC

## 2017-05-17 MED ORDER — MEPERIDINE HCL 25 MG/ML IJ SOLN: 6.2500 mg | INTRAMUSCULAR | Status: DC | PRN

## 2017-05-17 MED ORDER — KETOROLAC TROMETHAMINE 30 MG/ML IJ SOLN
INTRAMUSCULAR | Status: DC | PRN
  Administered 2017-05-17: 30 mg via INTRAVENOUS

## 2017-05-17 MED ORDER — LACTATED RINGERS IV SOLN: INTRAVENOUS | Status: DC

## 2017-05-17 MED ORDER — LIDOCAINE 2% (20 MG/ML) 5 ML SYRINGE
INTRAMUSCULAR | Status: DC | PRN
  Administered 2017-05-17: 100 mg via INTRAVENOUS

## 2017-05-17 MED ORDER — MIDAZOLAM HCL 2 MG/2ML IJ SOLN
2.0000 mg | Freq: Once | INTRAMUSCULAR | Status: AC
  Administered 2017-05-17: 2 mg via INTRAVENOUS

## 2017-05-17 MED ORDER — DIMETHYL FUMARATE 240 MG PO CPDR
240.0000 mg | DELAYED_RELEASE_CAPSULE | Freq: Two times a day (BID) | ORAL | Status: DC
  Administered 2017-05-18: 240 mg via ORAL
  Filled 2017-05-17 (×2): qty 1

## 2017-05-17 MED ORDER — HYDROMORPHONE HCL 1 MG/ML IJ SOLN: 0.2500 mg | INTRAMUSCULAR | Status: DC | PRN

## 2017-05-17 MED ORDER — FENTANYL CITRATE (PF) 100 MCG/2ML IJ SOLN
50.0000 ug | Freq: Once | INTRAMUSCULAR | Status: AC
  Administered 2017-05-17: 50 ug via INTRAVENOUS

## 2017-05-17 MED ORDER — LISINOPRIL 40 MG PO TABS: 40.0000 mg | ORAL_TABLET | Freq: Every day | ORAL | Status: DC

## 2017-05-17 MED ORDER — CEFAZOLIN SODIUM-DEXTROSE 2-4 GM/100ML-% IV SOLN
INTRAVENOUS | Status: AC
  Filled 2017-05-17: qty 100

## 2017-05-17 MED ORDER — ALPRAZOLAM 0.5 MG PO TABS: 0.5000 mg | ORAL_TABLET | Freq: Four times a day (QID) | ORAL | Status: DC | PRN

## 2017-05-17 MED ORDER — ROCURONIUM BROMIDE 10 MG/ML (PF) SYRINGE
PREFILLED_SYRINGE | INTRAVENOUS | Status: DC | PRN
  Administered 2017-05-17: 50 mg via INTRAVENOUS

## 2017-05-17 MED ORDER — FENTANYL CITRATE (PF) 100 MCG/2ML IJ SOLN
100.0000 ug | Freq: Once | INTRAMUSCULAR | Status: AC
  Administered 2017-05-17: 100 ug via INTRAVENOUS

## 2017-05-17 MED ORDER — CEFAZOLIN SODIUM-DEXTROSE 2-4 GM/100ML-% IV SOLN
2.0000 g | INTRAVENOUS | Status: AC
  Administered 2017-05-17: 2 g via INTRAVENOUS

## 2017-05-17 MED ORDER — CEFAZOLIN SODIUM-DEXTROSE 1-4 GM/50ML-% IV SOLN
1.0000 g | Freq: Three times a day (TID) | INTRAVENOUS | Status: DC
  Administered 2017-05-18 (×2): 1 g via INTRAVENOUS
  Filled 2017-05-17 (×3): qty 50

## 2017-05-17 MED ORDER — ONDANSETRON HCL 4 MG/2ML IJ SOLN: 4.0000 mg | Freq: Once | INTRAMUSCULAR | Status: DC | PRN

## 2017-05-17 MED ORDER — FENTANYL CITRATE (PF) 100 MCG/2ML IJ SOLN
INTRAMUSCULAR | Status: AC
  Administered 2017-05-17: 100 ug via INTRAVENOUS
  Filled 2017-05-17: qty 2

## 2017-05-17 MED ORDER — BUPIVACAINE-EPINEPHRINE (PF) 0.25% -1:200000 IJ SOLN
INTRAMUSCULAR | Status: DC | PRN
  Administered 2017-05-17: 10 mL

## 2017-05-17 MED ORDER — DEXTROSE 5 % IV SOLN
INTRAVENOUS | Status: DC | PRN
  Administered 2017-05-17: 25 ug/min via INTRAVENOUS

## 2017-05-17 MED ORDER — CEFAZOLIN SODIUM-DEXTROSE 1-4 GM/50ML-% IV SOLN
1.0000 g | INTRAVENOUS | Status: AC
  Administered 2017-05-17: 1 g via INTRAVENOUS

## 2017-05-17 MED ORDER — FAMOTIDINE 20 MG PO TABS
20.0000 mg | ORAL_TABLET | Freq: Two times a day (BID) | ORAL | Status: DC | PRN
Start: 2017-05-17 — End: 2017-05-18

## 2017-05-17 MED ORDER — PROPOFOL 10 MG/ML IV BOLUS
INTRAVENOUS | Status: DC | PRN
  Administered 2017-05-17: 120 mg via INTRAVENOUS

## 2017-05-17 MED ORDER — OXYCODONE HCL 5 MG PO TABS
5.0000 mg | ORAL_TABLET | ORAL | Status: DC | PRN
  Administered 2017-05-18 (×3): 10 mg via ORAL
  Filled 2017-05-17 (×3): qty 2

## 2017-05-17 MED ORDER — ROCURONIUM BROMIDE 10 MG/ML (PF) SYRINGE
PREFILLED_SYRINGE | INTRAVENOUS | Status: AC
  Filled 2017-05-17: qty 5

## 2017-05-17 MED ORDER — MIDAZOLAM HCL 2 MG/2ML IJ SOLN
INTRAMUSCULAR | Status: AC
  Filled 2017-05-17: qty 2

## 2017-05-17 MED ORDER — GLYCOPYRROLATE 0.2 MG/ML IJ SOLN
INTRAMUSCULAR | Status: DC | PRN
  Administered 2017-05-17: 0.1 mg via INTRAVENOUS

## 2017-05-17 MED ORDER — ONDANSETRON HCL 4 MG/2ML IJ SOLN: 4.0000 mg | Freq: Four times a day (QID) | INTRAMUSCULAR | Status: DC | PRN

## 2017-05-17 MED ORDER — PROMETHAZINE HCL 25 MG RE SUPP: 12.5000 mg | Freq: Four times a day (QID) | RECTAL | Status: DC | PRN

## 2017-05-17 MED ORDER — 0.9 % SODIUM CHLORIDE (POUR BTL) OPTIME
TOPICAL | Status: DC | PRN
  Administered 2017-05-17: 1000 mL

## 2017-05-17 MED ORDER — VITAMIN C 500 MG PO TABS
1000.0000 mg | ORAL_TABLET | Freq: Every day | ORAL | Status: DC
  Administered 2017-05-18: 1000 mg via ORAL
  Filled 2017-05-17: qty 2

## 2017-05-17 MED ORDER — FENTANYL CITRATE (PF) 100 MCG/2ML IJ SOLN
INTRAMUSCULAR | Status: DC | PRN
  Administered 2017-05-17: 100 ug via INTRAVENOUS
  Administered 2017-05-17 (×2): 50 ug via INTRAVENOUS

## 2017-05-17 MED ORDER — SCOPOLAMINE 1 MG/3DAYS TD PT72
1.0000 | MEDICATED_PATCH | TRANSDERMAL | Status: DC
  Filled 2017-05-17: qty 1

## 2017-05-17 MED ORDER — MIDAZOLAM HCL 2 MG/2ML IJ SOLN
INTRAMUSCULAR | Status: AC
  Administered 2017-05-17: 2 mg via INTRAVENOUS
  Filled 2017-05-17: qty 2

## 2017-05-17 MED ORDER — LACTATED RINGERS IV SOLN
INTRAVENOUS | Status: DC
  Administered 2017-05-17 (×3): via INTRAVENOUS

## 2017-05-17 MED ORDER — BISOPROLOL FUMARATE 5 MG PO TABS
2.5000 mg | ORAL_TABLET | Freq: Every day | ORAL | Status: DC
  Administered 2017-05-18: 2.5 mg via ORAL
  Filled 2017-05-17: qty 0.5

## 2017-05-17 MED ORDER — PHENYLEPHRINE 40 MCG/ML (10ML) SYRINGE FOR IV PUSH (FOR BLOOD PRESSURE SUPPORT)
PREFILLED_SYRINGE | INTRAVENOUS | Status: DC | PRN
  Administered 2017-05-17 (×2): 80 ug via INTRAVENOUS

## 2017-05-17 MED ORDER — SCOPOLAMINE 1 MG/3DAYS TD PT72
MEDICATED_PATCH | TRANSDERMAL | Status: DC | PRN
  Administered 2017-05-17: 1 via TRANSDERMAL

## 2017-05-17 MED ORDER — DEXAMETHASONE SODIUM PHOSPHATE 10 MG/ML IJ SOLN
INTRAMUSCULAR | Status: DC | PRN
  Administered 2017-05-17: 10 mg via INTRAVENOUS

## 2017-05-17 MED ORDER — ZONISAMIDE 100 MG PO CAPS
200.0000 mg | ORAL_CAPSULE | Freq: Every day | ORAL | Status: DC
  Filled 2017-05-17: qty 2

## 2017-05-17 MED ORDER — ONDANSETRON HCL 4 MG/2ML IJ SOLN
INTRAMUSCULAR | Status: DC | PRN
  Administered 2017-05-17: 4 mg via INTRAVENOUS

## 2017-05-17 MED ORDER — SCOPOLAMINE 1 MG/3DAYS TD PT72
MEDICATED_PATCH | TRANSDERMAL | Status: AC
  Filled 2017-05-17: qty 1

## 2017-05-17 MED ORDER — ONDANSETRON HCL 4 MG/2ML IJ SOLN
INTRAMUSCULAR | Status: AC
  Filled 2017-05-17: qty 2

## 2017-05-17 MED ORDER — SODIUM CHLORIDE 0.9 % IR SOLN
Status: DC | PRN
  Administered 2017-05-17: 3000 mL

## 2017-05-17 MED ORDER — SUGAMMADEX SODIUM 200 MG/2ML IV SOLN
INTRAVENOUS | Status: DC | PRN
  Administered 2017-05-17: 145.2 mg via INTRAVENOUS

## 2017-05-17 MED ORDER — CEFAZOLIN SODIUM-DEXTROSE 2-3 GM-% IV SOLR: INTRAVENOUS | Status: DC | PRN

## 2017-05-17 MED ORDER — CEFAZOLIN SODIUM-DEXTROSE 1-4 GM/50ML-% IV SOLN
INTRAVENOUS | Status: AC
  Filled 2017-05-17: qty 50

## 2017-05-17 MED ORDER — ONDANSETRON HCL 4 MG PO TABS: 4.0000 mg | ORAL_TABLET | Freq: Four times a day (QID) | ORAL | Status: DC | PRN

## 2017-05-17 MED ORDER — FENTANYL CITRATE (PF) 100 MCG/2ML IJ SOLN
INTRAMUSCULAR | Status: AC
  Administered 2017-05-17: 50 ug via INTRAVENOUS
  Filled 2017-05-17: qty 2

## 2017-05-17 MED ORDER — BUPIVACAINE-EPINEPHRINE (PF) 0.25% -1:200000 IJ SOLN
INTRAMUSCULAR | Status: AC
  Filled 2017-05-17: qty 30

## 2017-05-17 MED ORDER — METHOCARBAMOL 500 MG PO TABS
500.0000 mg | ORAL_TABLET | Freq: Four times a day (QID) | ORAL | Status: DC | PRN
  Administered 2017-05-18 (×2): 500 mg via ORAL
  Filled 2017-05-17 (×2): qty 1

## 2017-05-17 MED ORDER — DOCUSATE SODIUM 100 MG PO CAPS
100.0000 mg | ORAL_CAPSULE | Freq: Two times a day (BID) | ORAL | Status: DC
  Administered 2017-05-18: 100 mg via ORAL
  Filled 2017-05-17: qty 1

## 2017-05-17 SURGICAL SUPPLY — 91 items
BANDAGE ELASTIC 4 VELCRO ST LF (GAUZE/BANDAGES/DRESSINGS) ×3 IMPLANT
BANDAGE ELASTIC 6 VELCRO ST LF (GAUZE/BANDAGES/DRESSINGS) ×3 IMPLANT
BIT DRILL 110X2.5XQCK CNCT (BIT) ×1 IMPLANT
BIT DRILL 2.5 (BIT) ×2
BIT DRILL 4 LONG FAST STEP (BIT) ×3 IMPLANT
BIT DRILL 4 SHORT FAST STEP (BIT) ×3 IMPLANT
BIT DRILL QC 110 3.5 (BIT) ×1
BIT DRILL QC 110 3.5MM (BIT) ×1 IMPLANT
BIT DRL 110X2.5XQCK CNCT (BIT) ×1
CLOSURE WOUND 1/2 X4 (GAUZE/BANDAGES/DRESSINGS) ×2
COVER SURGICAL LIGHT HANDLE (MISCELLANEOUS) ×3 IMPLANT
DRAPE C-ARM 42X72 X-RAY (DRAPES) ×3 IMPLANT
DRAPE IMP U-DRAPE 54X76 (DRAPES) ×3 IMPLANT
DRAPE INCISE IOBAN 66X45 STRL (DRAPES) ×6 IMPLANT
DRAPE ORTHO SPLIT 77X108 STRL (DRAPES) ×4
DRAPE SURG ORHT 6 SPLT 77X108 (DRAPES) ×2 IMPLANT
DRAPE U-SHAPE 47X51 STRL (DRAPES) ×3 IMPLANT
DRILL BIT 2.8MM DB28 (MISCELLANEOUS) ×3 IMPLANT
DRILL BIT QC 110 3.5MM (BIT) ×2
DRSG EMULSION OIL 3X3 NADH (GAUZE/BANDAGES/DRESSINGS) ×3 IMPLANT
DRSG MEPILEX BORDER 4X12 (GAUZE/BANDAGES/DRESSINGS) ×3 IMPLANT
DRSG MEPILEX BORDER 4X8 (GAUZE/BANDAGES/DRESSINGS) ×3 IMPLANT
DURAPREP 26ML APPLICATOR (WOUND CARE) ×3 IMPLANT
ELECT NEEDLE BLADE 2-5/6 (NEEDLE) ×3 IMPLANT
ELECT REM PT RETURN 9FT ADLT (ELECTROSURGICAL) ×3
ELECTRODE REM PT RTRN 9FT ADLT (ELECTROSURGICAL) ×1 IMPLANT
GAUZE SPONGE 4X4 12PLY STRL (GAUZE/BANDAGES/DRESSINGS) ×3 IMPLANT
GLOVE BIOGEL PI ORTHO PRO 7.5 (GLOVE) ×2
GLOVE BIOGEL PI ORTHO PRO SZ8 (GLOVE) ×2
GLOVE ORTHO TXT STRL SZ7.5 (GLOVE) ×3 IMPLANT
GLOVE PI ORTHO PRO STRL 7.5 (GLOVE) ×1 IMPLANT
GLOVE PI ORTHO PRO STRL SZ8 (GLOVE) ×1 IMPLANT
GLOVE SURG ORTHO 8.5 STRL (GLOVE) ×6 IMPLANT
GLOVE SURG SS PI 7.5 STRL IVOR (GLOVE) ×6 IMPLANT
GOWN STRL REUS W/ TWL XL LVL3 (GOWN DISPOSABLE) ×2 IMPLANT
GOWN STRL REUS W/TWL XL LVL3 (GOWN DISPOSABLE) ×4
K-WIRE 2X5 SS THRDED S3 (WIRE) ×3
KIT BASIN OR (CUSTOM PROCEDURE TRAY) ×3 IMPLANT
KIT ROOM TURNOVER OR (KITS) ×3 IMPLANT
KWIRE 2X5 SS THRDED S3 (WIRE) ×1 IMPLANT
LOOP VESSEL MAXI BLUE (MISCELLANEOUS) ×3 IMPLANT
MANIFOLD NEPTUNE II (INSTRUMENTS) ×3 IMPLANT
NEEDLE HYPO 25GX1X1/2 BEV (NEEDLE) IMPLANT
NS IRRIG 1000ML POUR BTL (IV SOLUTION) ×3 IMPLANT
PACK SHOULDER (CUSTOM PROCEDURE TRAY) ×3 IMPLANT
PAD ABD 8X10 STRL (GAUZE/BANDAGES/DRESSINGS) ×3 IMPLANT
PAD ARMBOARD 7.5X6 YLW CONV (MISCELLANEOUS) ×6 IMPLANT
PAD CAST 4YDX4 CTTN HI CHSV (CAST SUPPLIES) ×1 IMPLANT
PADDING CAST COTTON 4X4 STRL (CAST SUPPLIES) ×2
PADDING CAST COTTON 6X4 STRL (CAST SUPPLIES) ×3 IMPLANT
PEG STND 4.0X35MM (Orthopedic Implant) ×3 IMPLANT
PEG STND 4.0X37.5MM (Orthopedic Implant) ×3 IMPLANT
PEG STND 4.0X40MM (Orthopedic Implant) ×3 IMPLANT
PEG STND 4.0X42.5MM (Orthopedic Implant) ×3 IMPLANT
PEGSTD 4.0X35MM (Orthopedic Implant) ×1 IMPLANT
PEGSTD 4.0X37.5MM (Orthopedic Implant) ×1 IMPLANT
PEGSTD 4.0X40MM (Orthopedic Implant) ×1 IMPLANT
PEGSTD 4.0X42.5MM (Orthopedic Implant) ×1 IMPLANT
PLATE SHOULDER S3 14HOLE RT (Plate) ×3 IMPLANT
SCREW CORT 2.5X20X3.5XST SM (Screw) ×2 IMPLANT
SCREW CORT 2.5X24X3.5XST SM (Screw) ×1 IMPLANT
SCREW CORT 2.5X30X3.5 SM (Screw) ×1 IMPLANT
SCREW CORTICAL 3.5 18MM (Screw) ×3 IMPLANT
SCREW CORTICAL 3.5X20 (Screw) ×4 IMPLANT
SCREW CORTICAL 3.5X24MM (Screw) ×2 IMPLANT
SCREW CORTICAL 3.5X26 (Screw) ×3 IMPLANT
SCREW CORTICAL 3.5X30MM (Screw) ×2 IMPLANT
SCREW LOCK 90D ANGLED 3.8X26 (Screw) ×15 IMPLANT
SCREW LOCK 90D ANGLED 3.8X28 (Screw) ×3 IMPLANT
SCREW MULTIDIR 3.8X26 HUMRL (Screw) ×3 IMPLANT
SLING ARM FOAM STRAP LRG (SOFTGOODS) ×3 IMPLANT
SPLINT FIBERGLASS 3X35 (CAST SUPPLIES) ×3 IMPLANT
SPONGE LAP 4X18 X RAY DECT (DISPOSABLE) ×9 IMPLANT
STRIP CLOSURE SKIN 1/2X4 (GAUZE/BANDAGES/DRESSINGS) ×4 IMPLANT
SUCTION FRAZIER HANDLE 10FR (MISCELLANEOUS) ×2
SUCTION TUBE FRAZIER 10FR DISP (MISCELLANEOUS) ×1 IMPLANT
SUT FIBERWIRE #2 38 T-5 BLUE (SUTURE)
SUT MNCRL AB 4-0 PS2 18 (SUTURE) ×3 IMPLANT
SUT PROLENE 4 0 PS 2 18 (SUTURE) ×6 IMPLANT
SUT VIC AB 0 CT1 27 (SUTURE) ×4
SUT VIC AB 0 CT1 27XBRD ANBCTR (SUTURE) ×2 IMPLANT
SUT VIC AB 2-0 CT1 27 (SUTURE) ×2
SUT VIC AB 2-0 CT1 TAPERPNT 27 (SUTURE) ×1 IMPLANT
SUT VIC AB 3-0 CT1 27 (SUTURE) ×2
SUT VIC AB 3-0 CT1 TAPERPNT 27 (SUTURE) ×1 IMPLANT
SUT VIC AB 3-0 X1 27 (SUTURE) ×3 IMPLANT
SUTURE FIBERWR #2 38 T-5 BLUE (SUTURE) IMPLANT
SYR CONTROL 10ML LL (SYRINGE) ×3 IMPLANT
TOWEL OR 17X24 6PK STRL BLUE (TOWEL DISPOSABLE) ×3 IMPLANT
TOWEL OR 17X26 10 PK STRL BLUE (TOWEL DISPOSABLE) ×3 IMPLANT
TRAY FOLEY CATH 14FR (SET/KITS/TRAYS/PACK) ×3 IMPLANT

## 2017-05-17 NOTE — Op Note (Signed)
See full dictation [phone]  Status post ORIF comminuted complex humeral shaft fracture with radial nerve neuro lysis and exploration with decompression. We will admit her for antibiotics and pain management and other measures.   Surgeons:Obelia Bonello / Veverly Fells MD

## 2017-05-17 NOTE — Anesthesia Preprocedure Evaluation (Signed)
Anesthesia Evaluation  Patient identified by MRN, date of birth, ID band Patient awake    Reviewed: Allergy & Precautions, NPO status , Patient's Chart, lab work & pertinent test results  History of Anesthesia Complications (+) PONV  Airway Mallampati: I  TM Distance: >3 FB Neck ROM: Full    Dental   Pulmonary asthma ,    Pulmonary exam normal        Cardiovascular hypertension, Pt. on medications Normal cardiovascular exam     Neuro/Psych    GI/Hepatic   Endo/Other    Renal/GU      Musculoskeletal   Abdominal   Peds  Hematology   Anesthesia Other Findings   Reproductive/Obstetrics                             Anesthesia Physical Anesthesia Plan  ASA: II  Anesthesia Plan: General   Post-op Pain Management:    Induction: Intravenous  PONV Risk Score and Plan: 4 or greater and Ondansetron, Dexamethasone, Midazolam and Treatment may vary due to age or medical condition  Airway Management Planned: Oral ETT  Additional Equipment:   Intra-op Plan:   Post-operative Plan: Extubation in OR  Informed Consent: I have reviewed the patients History and Physical, chart, labs and discussed the procedure including the risks, benefits and alternatives for the proposed anesthesia with the patient or authorized representative who has indicated his/her understanding and acceptance.     Plan Discussed with: CRNA and Surgeon  Anesthesia Plan Comments:         Anesthesia Quick Evaluation

## 2017-05-17 NOTE — Interval H&P Note (Signed)
History and Physical Interval Note:  05/17/2017 4:02 PM  Cynthia Nichols  has presented today for surgery, with the diagnosis of RIGHT HUMERUS FRACTURE  The various methods of treatment have been discussed with the patient and family. After consideration of risks, benefits and other options for treatment, the patient has consented to  Procedure(s): OPEN REDUCTION INTERNAL FIXATION (ORIF) MIDSHAFT HUMERUS (Right) EXPLORATION AND EXPOSURE OF RADIAL NERVE (Right) as a surgical intervention .  The patient's history has been reviewed, patient examined, no change in status, stable for surgery.  I have reviewed the patient's chart and labs.  Questions were answered to the patient's satisfaction.     Luismario Coston,STEVEN R

## 2017-05-17 NOTE — Anesthesia Procedure Notes (Addendum)
Anesthesia Regional Block: Interscalene brachial plexus block   Pre-Anesthetic Checklist: ,, timeout performed, Correct Patient, Correct Site, Correct Laterality, Correct Procedure, Correct Position, site marked, Risks and benefits discussed,  Surgical consent,  Pre-op evaluation,  At surgeon's request and post-op pain management  Laterality: Right  Prep: chloraprep       Needles:  Injection technique: Single-shot  Needle Type: Other     Needle Length: 9cm  Needle Gauge: 21   Needle insertion depth: 5 cm   Additional Needles:   Procedures:, nerve stimulator,,,,,,,  Narrative:  Start time: 05/17/2017 4:10 PM End time: 05/17/2017 4:20 PM Injection made incrementally with aspirations every 5 mL.  Performed by: Personally  Anesthesiologist: Lillia Abed  Additional Notes: Monitors applied. Patient sedated. Sterile prep and drape,hand hygiene and sterile gloves were used. Needle position confirmed with evoked response at 0.4 mV.Local anesthetic injected incrementally after negative aspiration.Vascular puncture avoided. No complications. The patient tolerated the procedure well.

## 2017-05-17 NOTE — H&P (View-Only) (Signed)
Cynthia Nichols is an 56 y.o. female.    Chief Complaint: right shoulder pain s/p fall  HPI: 56 y/o female with a ground level fall earlier today after her sandal got caught on a piece of plywood while shopping for vegetables. C/o immediate pain to right shoulder with inability to lift or move her arm. 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 Denies any other injuries. Denies any LOC, dizziness or headache. Denies any previous problems with her right shoulder in the past.  PCP:  Gardner Candle, MD (Inactive)  PMH: Past Medical History:  Diagnosis Date  . Adopted    Patient has no known family history  . Asthma   . Headache(784.0)   . Hypertension   . IBS (irritable bowel syndrome)   . MS (multiple sclerosis) ([place])   . Venous aneurysm    Right side of neck    PSH: Past Surgical History:  Procedure Laterality Date  . ABDOMINAL HYSTERECTOMY    . BREAST MASS EXCISION     Bilateral breast mass excision ( Benign)    Social History:  reports that she has never smoked. She has never used smokeless tobacco. She reports that she does not drink alcohol or use drugs.  Allergies:  Allergies  Allergen Reactions  . Ampyra [Dalfampridine] Anaphylaxis and Shortness Of Breath    Chest pain, also   . Imitrex [Sumatriptan] Other (See Comments)    Chest tightness  . Peanut-Containing Drug Products Itching    Itching in the mouth (remarked that she still eats this in limited amounts)  . Shrimp [Shellfish Allergy] Itching    Itching in the mouth (remarked that she still eats this in limited amounts)    Medications: Current Facility-Administered Medications  Medication Dose Route Frequency Provider Last Rate Last Dose  . 0.9 %  sodium chloride infusion   Intravenous Continuous Cline Crock, PA-C      . Derrill Memo ON 05/14/2017] bisoprolol (ZEBETA) tablet 2.5 mg  2.5 mg Oral Daily Cline Crock, PA-C      . Derrill Memo ON  05/14/2017] cholecalciferol (VITAMIN D) tablet 4,000 Units  4,000 Units Oral Daily Cline Crock, PA-C      . Dimethyl Fumarate CPDR 240 mg  1 capsule Oral BID Cline Crock, PA-C      . gabapentin (NEURONTIN) capsule 1,100 mg  1,100 mg Oral QHS Cline Crock, PA-C      . HYDROmorphone (DILAUDID) injection 1 mg  1 mg Intravenous Q2H PRN Cline Crock, PA-C      . Derrill Memo ON 05/14/2017] lisinopril (PRINIVIL,ZESTRIL) tablet 40 mg  40 mg Oral Daily Dixon, Robb Matar, PA-C      . methocarbamol (ROBAXIN) tablet 500 mg  500 mg Oral Q6H PRN Cline Crock, PA-C      . ondansetron (ZOFRAN-ODT) disintegrating tablet 4 mg  4 mg Oral Q6H PRN Cline Crock, PA-C       Or  . ondansetron [facility]) injection 4 mg  4 mg Intravenous Q6H PRN Cline Crock, PA-C      . oxyCODONE-acetaminophen (PERCOCET/ROXICET) 5-325 MG per tablet 1-2 tablet  1-2 tablet Oral Q4H PRN Cline Crock, PA-C      . Derrill Memo ON 05/14/2017] vitamin B-12 (CYANOCOBALAMIN) tablet 5,000 mcg  5,000 mcg Oral Daily Dixon, Robb Matar, PA-C      . zolpidem (AMBIEN) tablet 5 mg  5 mg Oral QHS PRN Cline Crock, PA-C  Current Outpatient Prescriptions  Medication Sig Dispense Refill  . bisoprolol (ZEBETA) 5 MG tablet Take 2.5 mg by mouth daily.    . butalbital-acetaminophen-caffeine (FIORICET, ESGIC) 50-325-40 MG per tablet Take 1 tablet by mouth 2 (two) times daily as needed.    . cholecalciferol (VITAMIN D) 1000 UNITS tablet Take 4,000 Units by mouth daily.     . cyanocobalamin 500 MCG tablet Take 5,000 mcg by mouth daily.    . Dimethyl Fumarate 240 MG CPDR Take 1 capsule by mouth 2 (two) times daily.    Marland Kitchen gabapentin (NEURONTIN) 600 MG tablet Take 1,100 mg by mouth at bedtime.     Marland Kitchen lisinopril (PRINIVIL,ZESTRIL) 40 MG tablet Take 40 mg by mouth daily.    . Vitamin D, Ergocalciferol, (DRISDOL) 50000 units CAPS capsule Take 1 capsule (50,000 Units total) by mouth every 7  (seven) days. 12 capsule 0  . zolpidem (AMBIEN) 5 MG tablet Take 5 mg by mouth at bedtime as needed for sleep.      No results found for this or any previous visit (from the past 48 hour(s)). Dg Shoulder Right  Result Date: 05/13/2017 CLINICAL DATA:  Right upper arm deformity after fall. EXAM: RIGHT SHOULDER - 2+ VIEW COMPARISON:  None. FINDINGS: Moderately angulated and comminuted fracture of the proximal right humeral shaft is noted. No soft tissue abnormality is noted. IMPRESSION: Moderately angulated and comminuted proximal right humeral shaft fracture. Electronically Signed   By: Marijo Conception, M.D.   On: 05/13/2017 19:17   Dg Humerus Right  Result Date: 05/13/2017 CLINICAL DATA:  Right upper arm deformity after fall. EXAM: RIGHT HUMERUS - 2+ VIEW COMPARISON:  None. FINDINGS: Moderately angulated and comminuted fracture is seen involving the proximal right humeral shaft. No soft tissue abnormality is noted. IMPRESSION: Moderately angulated and comminuted proximal right humeral shaft fracture. Electronically Signed   By: Marijo Conception, M.D.   On: 05/13/2017 19:16    ROS: ROS Unable to move right arm due to pain Unable to make a fist or lift her wrist  Physical Exam: Alert and appropriate 56 y/o female in no acute distress Pt is right hand dominant Cervical spine with full rom, no tenderness or deformity Left upper extremity with full rom, no tenderness or deformity Right upper extremity: moderate edema and mild deformity to right shoulder/humerus Sensation is intact distally but unable to make a fist or extend her wrist suggesting radial nerve palsy, involvement with known fracture No taken thru any rom due to known fracture Hips nontender and stable Bilateral lower extremities with small abrasion to right foot otherwise negative Physical Exam   Assessment/Plan Assessment: right comminuted humerus fracture  Plan: Pt had slight reduction and coaptation splint placed in the  emergency department with improvement of radial nerve symptoms. Able to make a fist and sensation distal right hand improved.  Admit overnight and recheck her nerve in the morning Surgery for ORIF and radial nerve exploration may be needed if nerve continues to have a deficit Pain management NPO after midnight   I have seen and examined this patient and agree with the above assessment and plan.  Will consult with Dr Laurelyn Sickle concerning appropriate timing of surgical intervention for the radial nerve and the humerus fracture.

## 2017-05-17 NOTE — Anesthesia Procedure Notes (Signed)
Procedure Name: Intubation Date/Time: 05/17/2017 4:53 PM Performed by: Rejeana Brock L Pre-anesthesia Checklist: Patient identified, Emergency Drugs available, Suction available and Patient being monitored Patient Re-evaluated:Patient Re-evaluated prior to induction Oxygen Delivery Method: Circle System Utilized Preoxygenation: Pre-oxygenation with 100% oxygen Induction Type: IV induction Ventilation: Mask ventilation without difficulty Laryngoscope Size: Mac and 3 Grade View: Grade II Tube type: Oral Number of attempts: 1 Airway Equipment and Method: Stylet and Oral airway Placement Confirmation: ETT inserted through vocal cords under direct vision,  positive ETCO2 and breath sounds checked- equal and bilateral Secured at: 21 cm Tube secured with: Tape Dental Injury: Teeth and Oropharynx as per pre-operative assessment

## 2017-05-17 NOTE — Transfer of Care (Signed)
Immediate Anesthesia Transfer of Care Note  Patient: Cynthia Nichols  Procedure(s) Performed: Procedure(s): OPEN REDUCTION INTERNAL FIXATION (ORIF) MIDSHAFT RIGHT HUMERUS (Right) EXPLORATION AND EXPOSURE OF RADIAL NERVE RIGHT ARM (Right)  Patient Location: PACU  Anesthesia Type:GA combined with regional for post-op pain  Level of Consciousness: awake, alert  and oriented  Airway & Oxygen Therapy: Patient Spontanous Breathing and Patient connected to nasal cannula oxygen  Post-op Assessment: Report given to RN and Post -op Vital signs reviewed and stable  Post vital signs: Reviewed and stable  Last Vitals:  Vitals:   05/17/17 1313  BP: (!) 120/58  Pulse: 78  Temp: 36.9 C  SpO2: 98%    Last Pain:  Vitals:   05/17/17 1337  TempSrc:   PainSc: 8          Complications: No apparent anesthesia complications

## 2017-05-18 ENCOUNTER — Encounter (HOSPITAL_COMMUNITY): Payer: Self-pay

## 2017-05-18 MED ORDER — OXYCODONE HCL 5 MG PO TABS: 5.0000 mg | ORAL_TABLET | ORAL | 0 refills | Status: DC | PRN

## 2017-05-18 NOTE — Progress Notes (Signed)
Subjective: 1 Day Post-Op Procedure(s) (LRB): OPEN REDUCTION INTERNAL FIXATION (ORIF) MIDSHAFT RIGHT HUMERUS (Right) EXPLORATION AND EXPOSURE OF RADIAL NERVE RIGHT ARM (Right) Patient reports pain as fairly controlled. She still has some mild residual effects of the block. We have discussed with her her intraoperative findings and our postop protocol at length. She denies nausea, vomiting, fever or chills. She is tolerating a regular diet and voiding without difficulties.    Objective: Vital signs in last 24 hours: Temp:  [97.5 F (36.4 C)-98.8 F (37.1 C)] 98.8 F (37.1 C) (08/29 0502) Pulse Rate:  [78-87] 87 (08/29 0502) Resp:  [15-18] 15 (08/29 0502) BP: (118-144)/(58-74) 122/64 (08/29 0502) SpO2:  [98 %-100 %] 100 % (08/29 0502) Weight:  [72.6 kg (160 lb)-83 kg (182 lb 14.4 oz)] 83 kg (182 lb 14.4 oz) (08/28 2152)  Intake/Output from previous day: 08/28 0701 - 08/29 0700 In: 1200 [P.O.:30; I.V.:1120; IV Piggyback:50] Out: 800 [Urine:750; Blood:50] Intake/Output this shift: No intake/output data recorded.   Recent Labs  05/17/17 1538  HGB 11.7*    Recent Labs  05/17/17 1538  WBC 5.6  RBC 3.82*  HCT 35.4*  PLT 196   No results for input(s): NA, K, CL, CO2, BUN, CREATININE, GLUCOSE, CALCIUM in the last 72 hours. No results for input(s): LABPT, INR in the last 72 hours. The patient is awake she is sitting up in a chair. She is alert and oriented very pleasant. The patient is alert and oriented in no acute distress. The patient complains of pain in the affected upper extremity.  The patient is noted to have a normal HEENT exam. Lung fields show equal chest expansion and no shortness of breath. Abdomen exam is nontender without distention. Lower extremity examination does not show any fracture dislocation or blood clot symptoms. Pelvis is stable and the neck and back are stable and nontender. Examination of the right upper extreme he shows her splint is clean dry and  intact. She has mild edema of the digits and hand. Radial nerve deficit is present. Gross sensation is intact however she does have some degree of residual effects of her preoperative block present.  Assessment/Plan: 1 Day Post-Op Procedure(s) (LRB): OPEN REDUCTION INTERNAL FIXATION (ORIF) MIDSHAFT RIGHT HUMERUS (Right) EXPLORATION AND EXPOSURE OF RADIAL NERVE RIGHT ARM (Right) We have had a lengthy discussion with the patient today in regards to her upper extremity and her postoperative plan. The patient is eager for discharge, however, I have discussed with her given the residual effects of the block we certainly do not want to discharge her home prematurely and she have uncontrolled pain. Thus, we're going to continue her admit and if after 3:30 PM today her block has subsided and her pain is tolerated with by mouth medications she may be discharged. However, if she is having difficulties we'll continue her admit until tomorrow. She understands this and agrees. We have discussed with her diligent elevation, edema control, active range of motion of the digits, passive extension and massage to the digits. She will need her sling on when she is ambulatory. All questions were encouraged and answered.  Keandre Linden L 05/18/2017, 9:44 AM

## 2017-05-18 NOTE — Op Note (Deleted)
  The note originally documented on this encounter has been moved the the encounter in which it belongs.  

## 2017-05-18 NOTE — Op Note (Signed)
Cynthia Nichols, Cynthia Nichols NO.:  [id number]  MEDICAL RECORD NO.:  [id number]  LOCATION:                               FACILITY:  [facility]  PHYSICIAN:  Satira Anis. Kenise Barraco, M.D.DATE OF BIRTH:  [birth year]  DATE OF PROCEDURE:05/17/17 DATE OF DISCHARGE:tbd                              OPERATIVE REPORT   PREOPERATIVE DIAGNOSIS:  Comminuted complex segmental humerus shaft fracture, right upper extremity, with radial nerve injury.  POSTOPERATIVE DIAGNOSIS:  Comminuted complex segmental humerus shaft fracture, right upper extremity, with radial nerve injury.  PROCEDURES: 1. Open reduction and internal fixation with an extended plate and     screw construct, right humerus.  This was a 14-hole S3 [employer] humerus plate with long shaft extension. 2. An 8-view radiographic series, humerus, elbow, and shoulder. 3. Extensive radial nerve decompression, neurolysis, and exploration,     right arm.  SURGEON:  Satira Anis. Amedeo Plenty, M.D.  Lolly MustacheRemo Lipps R. Veverly Fells, M.D.  TOURNIQUET TIME:  Zero.  ESTIMATED BLOOD LOSS:  Less than 150 mL.  DRAINS:  None.  INDICATIONS:  This is a 56 year old female with a shattered humerus fracture and radial nerve injury.  She has some very large spikes of bone and with a radial nerve injury and the instability noted on radiographs.  We recommended surgical evaluation and repair.  The patient understands risks and benefits of surgery.  Preoperatively, I examined her at great length.  She was having some wrist pain in the right arm and foot pain in the right foot and thus we planned fluoroscopy as well in these areas.  She understands risks and benefits of surgery, understands the radial nerve injury issues, the timeframe duration of recovery, possible need for tendon transfers in the future, etc.  With this in mind, we will proceed accordingly.  OPERATIVE PROCEDURE:  The patient was seen by myself and Anesthesia, taken to the  operative theater, and underwent smooth induction of general anesthesia.  She was laid supine and then placed in a beach- chair position and shoulder positioner was then appropriately placed. The arm was prescrubbed with Hibiclens, scrubbed by myself, and following this, we then placed DuraPrep on the skin according to standard technique.  Dry time was allowed and following this, sterile field was secured with draping.  The operation commenced with an extended incision.  This was a deltopectoral incision, extending to the area just above the distal elbow crease.  We dissected distally first and identified the radial nerve.  The skin and subcutaneous tissue were dissected carefully.  Crossing veins coagulated and the biceps was then retracted laterally.  Following this, I entered the interval between the brachialis and the brachioradialis, identified the radial nerve, and placed a vessel loop around the nerve.  I then tracked in a distal-to- proximal direction.  Following this, I found a very large spike of bone from the butterfly fragment, injuring and hitting against the nerve. The nerve had a contusive injury, but was not disrupted in terms of its fascicular alignment.  Following this, we then very carefully teased the butterfly fragment away and kept soft tissue attachments to the very large oblique butterfly fragment intact.  We dissected proximally and very carefully elevated the deltoid.  The biceps (long head biceps) was very carefully attended to and swept out of the way with retractors during the case.  The patient's pectoralis major was kept intact.  The deltoid was very carefully reflected in subperiosteal fashion and following this, the large spike connected to the shoulder was identified.  The radial nerve was also encroached upon by this spike. We very carefully and cautiously, under 4.5 expanded loupe magnification, performed a radial nerve neurolysis, decompression,  and dissection, removing clot and debris from it as well as getting the area decompressed from the 2 separate spikes which certainly had contact with it and likely caused the resultant radial nerve injury noted preoperatively.  At this time, we then very carefully and cautiously suctioned the bone and then went about re-assembling the arm.  The butterfly fragment was placed initially distally.  Clamps were held and 3 interfragmentary screws placed.  Following this, we then held the arm and connected the butterfly fragment to the proximal portion of the shoulder and proximal humerus.  This was once again held and interfragmentary screws were placed for fixation.  Following this, we then very carefully and cautiously placed a [employer] S3 extended 14-hole plate.  We checked this under AP, lateral, and oblique x-rays, and applied it without difficulty.  We had excellent proximal, middle, and distal fixation without difficulty.  Cortices looked excellent.  AP, lateral, and oblique x-rays were performed, examined and interpreted by myself, looked to be excellent.  I was quite pleased with this.  Following this, we then noted excellent stability, excellent alignment, and the radial nerve was completely decompressed without any offending structures against it.  Fixation looked excellent.  Area was irrigated with 3 L of saline.  Following this, the brachialis was closed in its internervous plane and the deltoid periosteum was reattached.  The patient tolerated this well.  There were no complicating features.  Compartments were soft.  Wound was closed with 3-0 and 2-0 Vicryl in the subcu followed by a subcuticular Prolene stitch.  Steri-Strips and Mepilex type dressing were applied without difficulty.  Long-arm splint was applied.  Following this, we then performed in-and-out cath in the operative theater and took her to the recovery room.  I should note that preoperatively, I did  fluoro of the foot, the hand, and the wrist, and they looked well.  This was the right foot and the right wrist in terms of a fluoro examination.  Going forward, we are going to keep her still for 2 weeks, remove her suture, and then begin therapy for very careful protected well-arm assisted range of motion about the elbow, wrist, forearm, and fingers, and gently move the shoulder at 4 weeks with Codman and pendulum exercises.  No overhead lifting for 6 to 8 weeks.  These notes have been discussed.  At the present time, she looks quite well.  Hopefully, the radial nerve will recover, although I discussed with her this could be 6 months to a year and if this does not recover, tendon transfers would be an option, and I would be happy to look forward to this surgical endeavor.  Nevertheless, given the nerves looked today and contusive injury, I think it has a good chance of coming back on its own accord.  These notes have been discussed as mentioned.  All questions addressed.     Satira Anis. Amedeo Plenty, M.D.   ______________________________ Satira Anis. Amedeo Plenty, M.D.    [facility]  D:  05/17/2017  T:  05/18/2017  Job:  [id number]

## 2017-05-18 NOTE — Plan of Care (Signed)
Problem: Safety: Goal: Ability to remain free from injury will improve Outcome: Progressing Patient's belongings and call bell are within reach.  Patient calls for assistance when needed

## 2017-05-18 NOTE — Discharge Summary (Signed)
Physician Discharge Summary  Patient ID: Cynthia Nichols MRN: [id number] DOB/AGE: 1961/05/18 56 y.o.  Admit date: 05/17/2017 Discharge date: tentative date 05/18/2017  Admission Diagnoses: RIGHT HUMERUS FRACTURE Past Medical History:  Diagnosis Date  . Adopted    Patient has no known family history  . Asthma   . Headache(784.0)   . 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 Hypertension   . IBS (irritable bowel syndrome)   . MS (multiple sclerosis) ([place])   . PONV (postoperative nausea and vomiting)   . Venous aneurysm    Right side of neck    Discharge Diagnoses:  Active Problems:   Humerus shaft fracture  Patient Active Problem List   Diagnosis Date Noted  . Humerus shaft fracture 05/17/2017  . Closed fracture of shaft of right humerus 05/13/2017  . Other fracture of shaft of right humerus, initial encounter for closed fracture 05/13/2017  . Contusion of elbow and forearm, initial encounter 08/26/2016  . Acute upper respiratory infection 12/08/2015  . 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  . Nonallopathic lesion of sacral region 10/23/2015  . Nonallopathic lesion of pelvic region 10/23/2015  . Nonallopathic lesion of lumbosacral region 10/23/2015  . Fracture of fifth metatarsal bone 08/13/2015  . Patella fracture 07/03/2015  . Palpitations 06/23/2012  . Mixed hyperlipidemia 06/23/2012  . MS (multiple sclerosis) ([place]) 06/23/2012  . Venous aneurysm   . IBS (irritable bowel syndrome)   . Unspecified venous (peripheral) insufficiency 11/09/2011  . 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 Leg pain 10/26/2011   Surgeries: Procedure(s): OPEN REDUCTION INTERNAL FIXATION (ORIF) MIDSHAFT RIGHT HUMERUS EXPLORATION AND EXPOSURE OF RADIAL NERVE RIGHT ARM on 05/17/2017    Consultants: one  Discharged Condition: Improved  Hospital Course: SHERIA ROSELLO is an 56 y.o. female who was admitted 05/17/2017 with a chief complaint of No chief complaint on  file. , and found to have a diagnosis of RIGHT HUMERUS FRACTURE.  They were brought to the operating room on 05/17/2017 and underwent Procedure(s): OPEN REDUCTION INTERNAL FIXATION (ORIF) MIDSHAFT RIGHT HUMERUS EXPLORATION AND EXPOSURE OF RADIAL NERVE RIGHT ARM.  The patient was admitted to the orthopedic unit for standard elevation, IV antibiotics and pain management. Postoperative day #1 the patient was doing well her pain was fairly controlled. We discussed with the patient at length potential for discharge home postoperative day #1 pending her pain remained controlled after the residual effects of the block had subsided.  Please see progress note dated 8/29  They were given perioperative antibiotics: Anti-infectives    Start     Dose/Rate Route Frequency Ordered Stop   05/18/17 0500  ceFAZolin (ANCEF) IVPB 1 g/50 mL premix     1 g 100 mL/hr over 30 Minutes Intravenous Every 8 hours 05/17/17 2240     05/17/17 2100  ceFAZolin (ANCEF) IVPB 1 g/50 mL premix     1 g 100 mL/hr over 30 Minutes Intravenous NOW 05/17/17 2054 05/17/17 2127   05/17/17 2056  ceFAZolin (ANCEF) 1-4 GM/50ML-% IVPB    Comments:  [facility]   : cabinet override      05/17/17 2056 05/18/17 0859   05/17/17 1515  ceFAZolin (ANCEF) IVPB 2g/100 mL premix     2 g 200 mL/hr over 30 Minutes Intravenous On call to O.R. 05/17/17 1254 05/17/17 1655   05/17/17 1307  ceFAZolin (ANCEF) 2-4 GM/100ML-% IVPB    Comments:  Grace Blight   : cabinet override  05/17/17 1307 05/17/17 1655    .  They were given sequential compression devices, early ambulation, and  DVT prophylaxis.  Recent vital signs: Patient Vitals for the past 24 hrs:  BP Temp Temp src Pulse Resp SpO2 Height Weight  05/18/17 0502 122/64 98.8 F (37.1 C) Oral 87 15 100 % - -  05/17/17 2152 (!) 144/74 98.7 F (37.1 C) Oral 80 15 100 % 5\' 7"  (1.702 m) 83 kg (182 lb 14.4 oz)  05/17/17 2111 134/66 - - - - - - -  05/17/17 2110 - (!) 97.5 F (36.4 C) - 83 15  100 % - -  05/17/17 2045 125/74 - - 78 15 100 % - -  05/17/17 2027 118/68 97.9 F (36.6 C) - 85 18 100 % - -  05/17/17 1313 (!) 120/58 98.5 F (36.9 C) Oral 78 - 98 % 5\' 7"  (1.702 m) 72.6 kg (160 lb)  .  Recent laboratory studies: No results found.  Discharge Medications:   Allergies as of 05/18/2017      Reactions   Ampyra [dalfampridine] Anaphylaxis, Shortness Of Breath   Chest pain, also    Imitrex [sumatriptan] Other (See Comments)   Chest tightness   Peanut-containing Drug Products Itching   Itching in the mouth (remarked that she still eats this in limited amounts)   Shrimp [shellfish Allergy] Itching   Itching in the mouth (remarked that she still eats this in limited amounts)      Medication List    STOP taking these medications   ibuprofen 200 MG tablet Commonly known as:  ADVIL,MOTRIN     TAKE these medications   bisoprolol 5 MG tablet Commonly known as:  ZEBETA Take 2.5 mg by mouth daily.   butalbital-acetaminophen-caffeine 50-325-40 MG tablet Commonly known as:  FIORICET, ESGIC Take 1 tablet by mouth 2 (two) times daily as needed for migraine.   CRANBERRY PO Take 1 tablet by mouth daily.   Dimethyl Fumarate 240 MG Cpdr Take 240 mg by mouth 2 (two) times daily.   gabapentin 800 MG tablet Commonly known as:  NEURONTIN Take 400-800 mg by mouth 2 (two) times daily. Take 400 mg in the morning and 800 mg at night   lisinopril 40 MG tablet Commonly known as:  PRINIVIL,ZESTRIL Take 40 mg by mouth at bedtime.   methocarbamol 500 MG tablet Commonly known as:  ROBAXIN Take 1 tablet (500 mg total) by mouth 3 (three) times daily as needed. What changed:  reasons to take this   ondansetron 4 MG tablet Commonly known as:  ZOFRAN Take 1 tablet (4 mg total) by mouth every 8 (eight) hours as needed for nausea or vomiting.   oxyCODONE 5 MG immediate release tablet Commonly known as:  Oxy IR/ROXICODONE Take 1-2 tablets (5-10 mg total) by mouth every 3 (three)  hours as needed for moderate pain. You may begin this prescription wants your initial Percocet prescription has been completed.   oxyCODONE-acetaminophen 5-325 MG tablet Commonly known as:  PERCOCET/ROXICET Take 1-2 tablets by mouth every 4 (four) hours as needed for moderate pain.   scopolamine 1 MG/3DAYS Commonly known as:  TRANSDERM-SCOP Place 1 patch onto the skin every 3 (three) days.   SINEX REGULAR NA Place 1 spray into the nose daily as needed (Allergies).   Vitamin D (Ergocalciferol) 50000 units Caps capsule Commonly known as:  DRISDOL Take 1 capsule (50,000 Units total) by mouth every 7 (seven) days. What changed:  when to take this   zolpidem  5 MG tablet Commonly known as:  AMBIEN Take 5 mg by mouth at bedtime.   zonisamide 100 MG capsule Commonly known as:  ZONEGRAN Take 200 mg by mouth at bedtime.            Discharge Care Instructions        Start     Ordered   05/18/17 0000  oxyCODONE (OXY IR/ROXICODONE) 5 MG immediate release tablet  Every  3 hours PRN    Question:  Supervising Provider  Answer:  Roseanne Kaufman   05/18/17 0953   05/18/17 0000  Call MD / Call 911    Comments:  If you experience chest pain or shortness of breath, CALL 911 and be transported to the hospital emergency room.  If you develope a fever above 101 F, pus (white drainage) or increased drainage or redness at the wound, or calf pain, call your surgeon's office.   05/18/17 0953   05/18/17 0000  Diet - low sodium heart healthy     05/18/17 0953   05/18/17 0000  Constipation Prevention    Comments:  Drink plenty of fluids.  Prune juice may be helpful.  You may use a stool softener, such as Colace (over the counter) 100 mg twice a day.  Use MiraLax (over the counter) for constipation as needed.   05/18/17 0953   05/18/17 0000  Increase activity slowly as tolerated     05/18/17 0953   05/18/17 0000  Discharge instructions    Comments:  Keep bandage clean and dry.  Call for any  problems.  No smoking.  Criteria for driving a car: you should be off your pain medicine for 7-8 hours, able to drive one handed(confident), thinking clearly and feeling able in your judgement to drive. Continue elevation as it will decrease swelling.  If instructed by MD move your fingers within the confines of the bandage/splint.  Use ice if instructed by your MD. Call immediately for any sudden loss of feeling in your hand/arm or change in functional abilities of the extremity.We recommend that you to take vitamin C 1000 mg a day to promote healing. We also recommend that if you require  pain medicine that you take a stool softener to prevent constipation as most pain medicines will have constipation side effects. We recommend either Peri-Colace or Senokot and recommend that you also consider adding MiraLAX as well to prevent the constipation affects from pain medicine if you are required to use them. These medicines are over the counter and may be purchased at a local pharmacy. A cup of yogurt and a probiotic can also be helpful during the recovery process as the medicines can disrupt your intestinal environment.   05/18/17 0953      Diagnostic Studies: Dg Shoulder Right  Result Date: 05/13/2017 CLINICAL DATA:  Right upper arm deformity after fall. EXAM: RIGHT SHOULDER - 2+ VIEW COMPARISON:  None. FINDINGS: Moderately angulated and comminuted fracture of the proximal right humeral shaft is noted. No soft tissue abnormality is noted. IMPRESSION: Moderately angulated and comminuted proximal right humeral shaft fracture. Electronically Signed   By: Marijo Conception, M.D.   On: 05/13/2017 19:17   Dg Humerus Right  Result Date: 05/13/2017 CLINICAL DATA:  Fracture, postreduction. EXAM: RIGHT HUMERUS - 2+ VIEW COMPARISON:  Pre reduction exam earlier this day. FINDINGS: Improved alignment of the comminuted displaced angulated mid proximal humeral fracture postreduction. Mild persistent apex medial angulation  persists. Overlying splint material obscures osseous and soft tissue fine  detail. IMPRESSION: Improved alignment of the comminuted displaced humeral fracture postreduction. Electronically Signed   By: Jeb Levering M.D.   On: 05/13/2017 23:56   Dg Humerus Right  Result Date: 05/13/2017 CLINICAL DATA:  Right upper arm deformity after fall. EXAM: RIGHT HUMERUS - 2+ VIEW COMPARISON:  None. FINDINGS: Moderately angulated and comminuted fracture is seen involving the proximal right humeral shaft. No soft tissue abnormality is noted. IMPRESSION: Moderately angulated and comminuted proximal right humeral shaft fracture. Electronically Signed   By: Marijo Conception, M.D.   On: 05/13/2017 19:16    They benefited maximally from their hospital stay and there were no complications.     Disposition: 01-Home or Self Care Discharge Instructions    Call MD / Call 911    Complete by:  As directed    If you experience chest pain or shortness of breath, CALL 911 and be transported to the hospital emergency room.  If you develope a fever above 101 F, pus (white drainage) or increased drainage or redness at the wound, or calf pain, call your surgeon's office.   Constipation Prevention    Complete by:  As directed    Drink plenty of fluids.  Prune juice may be helpful.  You may use a stool softener, such as Colace (over the counter) 100 mg twice a day.  Use MiraLax (over the counter) for constipation as needed.   Diet - low sodium heart healthy    Complete by:  As directed    Discharge instructions    Complete by:  As directed    Keep bandage clean and dry.  Call for any problems.  No smoking.  Criteria for driving a car: you should be off your pain medicine for 7-8 hours, able to drive one handed(confident), thinking clearly and feeling able in your judgement to drive. Continue elevation as it will decrease swelling.  If instructed by MD move your fingers within the confines of the bandage/splint.  Use ice if  instructed by your MD. Call immediately for any sudden loss of feeling in your hand/arm or change in functional abilities of the extremity.We recommend that you to take vitamin C 1000 mg a day to promote healing. We also recommend that if you require  pain medicine that you take a stool softener to prevent constipation as most pain medicines will have constipation side effects. We recommend either Peri-Colace or Senokot and recommend that you also consider adding MiraLAX as well to prevent the constipation affects from pain medicine if you are required to use them. These medicines are over the counter and may be purchased at a local pharmacy. A cup of yogurt and a probiotic can also be helpful during the recovery process as the medicines can disrupt your intestinal environment.   Increase activity slowly as tolerated    Complete by:  As directed      Follow-up Information    Roseanne Kaufman, MD Follow up in 2 week(s).   Specialty:  Orthopedic Surgery Why:  our office will contact you with follow-up date and time. Please call for any questions or concerns. Contact information: [address] [place] [address] [phone]            Signed: Ivan Croft 05/18/2017, 9:54 AM

## 2017-05-18 NOTE — Evaluation (Addendum)
Occupational Therapy Evaluation Patient Details Name: Cynthia Nichols MRN: [id number] DOB: [birth year] Today's Date: 05/18/2017    History of Present Illness 56 y.o. female admitted for ground level fall with comminuted humeral shaft fracture with radial nerve deficit. PHM including HTN, MS, and IBS.    Clinical Impression   PTA, pt was living with her husband and was independent prior to fall. Pt currently requiring Mod A for UB ADLs and Min A for LB ADLs. Provided education on UB compensatory techniques, use of AE for LB dressing, edema management and positioning, sling management, and hand exercises/self ROM. All acute OT needs met, education provided, and answered pt's questions in preparation for dc later today. Recommend dc home once medically stable per physician with follow up to outpatient hand therapy.    Follow Up Recommendations  DC plan and follow up therapy as arranged by surgeon;Supervision/Assistance - 24 hour (Outpatient OT once cleared by MD)    Equipment Recommendations  3 in 1 bedside commode    Recommendations for Other Services PT consult     Precautions / Restrictions Precautions Precautions: Fall Restrictions Weight Bearing Restrictions: Yes RUE Weight Bearing: Non weight bearing      Mobility Bed Mobility               General bed mobility comments: Pt in recliner upon arrival  Transfers Overall transfer level: Needs assistance Equipment used: 1 person hand held assist Transfers: Sit to/from Stand Sit to Stand: Min assist;Min guard         General transfer comment: Min Guard for safety for sit<>Stand; Min A for funcitonal mobility    Balance Overall balance assessment: Needs assistance Sitting-balance support: No upper extremity supported;Feet supported Sitting balance-Leahy Scale: Good Sitting balance - Comments: Performed LB dressing without LOB   Standing balance support: Single extremity supported;During functional  activity Standing balance-Leahy Scale: Fair Standing balance comment: Able to maintain static standing without UE support. Requiring single UE support for functional mobility                           ADL either performed or assessed with clinical judgement   ADL Overall ADL's : Needs assistance/impaired Eating/Feeding: Set up;Sitting Eating/Feeding Details (indicate cue type and reason): Educated pt on benefits of weighted utensils for tremors Grooming: Set up;Sitting Grooming Details (indicate cue type and reason): Pt been performing grooming with L hand - having difficulty since it is her non-dominant hand and has tremors Upper Body Bathing: Moderate assistance;Sitting;Cueing for UE precautions;Cueing for compensatory techniques Upper Body Bathing Details (indicate cue type and reason): Educated pt on compensatory techniques for sponge bathing UB  Lower Body Bathing: Sit to/from stand;Minimal assistance   Upper Body Dressing : Moderate assistance;Sitting;Cueing for compensatory techniques;Cueing for UE precautions Upper Body Dressing Details (indicate cue type and reason): Educated pt on UB dressing techniques. Pt verbalized understanding Lower Body Dressing: Minimal assistance;Sit to/from stand Lower Body Dressing Details (indicate cue type and reason): Pt able to don R sock one handed by bring L ankle to R knee. Requires assistance for R foot. Educated pt on using reacher for LB dressing. Pt donned depends with reacher and required Min A and educational cues. Feel pt would benefit from AE while recovering.  Toilet Transfer: Copy;Ambulation   Toileting- Clothing Manipulation and Hygiene: Set up;Sitting/lateral lean       Functional mobility during ADLs: Minimal assistance (Required single hand hold for balance) General ADL  Comments: Provided education on edema management, positioning, ADLs, sling management, and compensatory techniques. Pt  husband present throughout session and verbalized understanding as well to increase generalizability to home.      Vision         Perception     Praxis      Pertinent Vitals/Pain Pain Assessment: Faces Faces Pain Scale: Hurts even more Pain Location: R shoulder elbow, and hand Pain Descriptors / Indicators: Constant;Discomfort;Grimacing;Guarding Pain Intervention(s): Monitored during session;Limited activity within patient's tolerance;Repositioned;Patient requesting pain meds-RN notified     Hand Dominance Right   Extremity/Trunk Assessment Upper Extremity Assessment Upper Extremity Assessment: RUE deficits/detail RUE Deficits / Details: R humeral fx with radial nerve injury. Pt casted from upper arm to fingers. Pt able to composite flex fingers and thumb. Unable to extend fingers and thumb RUE: Unable to fully assess due to immobilization;Unable to fully assess due to pain RUE Coordination: decreased fine motor;decreased gross motor   Lower Extremity Assessment Lower Extremity Assessment: Defer to PT evaluation;RLE deficits/detail RLE Deficits / Details: Pt stating that her R leg is "the problem  child" and has difficulty performing LB ADLs on that side   Cervical / Trunk Assessment Cervical / Trunk Assessment: Normal   Communication Communication Communication: No difficulties   Cognition Arousal/Alertness: Awake/alert Behavior During Therapy: WFL for tasks assessed/performed Overall Cognitive Status: Within Functional Limits for tasks assessed                                     General Comments  Husband present throughout session    Exercises     Shoulder Instructions      Home Living Family/patient expects to be discharged to:: Private residence Living Arrangements: Spouse/significant other Available Help at Discharge: Friend(s);Available 24 hours/day Type of Home: House Home Access: Stairs to enter [employer] of Steps:  4-5 Entrance Stairs-Rails: Right;Left Home Layout: One level     Bathroom Shower/Tub: Occupational psychologist: Handicapped height     Home Equipment: Hand held shower head;Cane - single point          Prior Functioning/Environment Level of Independence: Needs assistance  Gait / Transfers Assistance Needed: Using a cane since  ADL's / Homemaking Assistance Needed: Husband has been assisting with ADLs since injury.  Independent prior to injury with ADLs, IADLs, and works as a Firefighter.             OT Problem List: Impaired balance (sitting and/or standing);Decreased range of motion;Decreased knowledge of use of DME or AE;Decreased knowledge of precautions;Pain;Impaired UE functional use      OT Treatment/Interventions:      OT Goals(Current goals can be found in the care plan section) Acute Rehab OT Goals Patient Stated Goal: Go home OT Goal Formulation: With patient Time For Goal Achievement: 06/01/17 Potential to Achieve Goals: Good  OT Frequency:     Barriers to D/C:            Co-evaluation              AM-PAC PT "6 Clicks" Daily Activity     Outcome Measure Help from another person eating meals?: A Little Help from another person taking care of personal grooming?: A Little Help from another person toileting, which includes using toliet, bedpan, or urinal?: A Little Help from another person bathing (including washing, rinsing, drying)?: A Little Help from another person  to put on and taking off regular upper body clothing?: A Lot Help from another person to put on and taking off regular lower body clothing?: A Little 6 Click Score: 17   End of Session Equipment Utilized During Treatment: Gait belt;Other (comment) (Sling) Nurse Communication: Mobility status;Precautions  Activity Tolerance: Patient tolerated treatment well Patient left: in chair;with call bell/phone within reach;with family/visitor present;with nursing/sitter in  room  OT Visit Diagnosis: Unsteadiness on feet (R26.81);Pain Pain - Right/Left: Right Pain - part of body: Shoulder;Hand;Arm                Time: 6854-8830 OT Time Calculation (min): 30 min Charges:  OT General Charges $OT Visit: 1 Visit OT Evaluation $OT Eval Moderate Complexity: 1 Mod OT Treatments $Self Care/Home Management : 8-22 mins G-Codes:     [place], OTR/L Acute Rehab Pager: [phone] Office: [place] 05/18/2017, 11:00 AM

## 2017-05-18 NOTE — Discharge Instructions (Signed)

## 2017-05-18 NOTE — Evaluation (Signed)
Physical Therapy Evaluation Patient Details Name: Cynthia Nichols MRN: [id number] DOB: Mar 02, 1961 Today's Date: 05/18/2017   History of Present Illness  56 y.o. female admitted for ground level fall with comminuted humeral shaft fracture with radial nerve deficit. PHM including HTN, MS, and IBS.   Clinical Impression  Pt functioning at supervision/min guard level for transfers and ambulation but requires modA for ADLs due to inability to use R UE. Pt motivated and positive. Acute PT to con't to follow.    Follow Up Recommendations No PT follow up;Supervision/Assistance - 24 hour    Equipment Recommendations  None recommended by PT    Recommendations for Other Services       Precautions / Restrictions Precautions Precautions: Fall Restrictions Weight Bearing Restrictions: Yes RUE Weight Bearing: Non weight bearing      Mobility  Bed Mobility Overal bed mobility: Modified Independent             General bed mobility comments: pt able to lay back down in bed with use of only L UE and bilat LEs, increased time but able to complete  Transfers Overall transfer level: Needs assistance Equipment used: None Transfers: Sit to/from Stand Sit to Stand: Min guard         General transfer comment: min guard for safety, able to manage straight cane in L hand and stand up safely  Ambulation/Gait Ambulation/Gait assistance: Min guard Ambulation Distance (Feet): 60 Feet Assistive device: Straight cane Gait Pattern/deviations: Step-to pattern;Decreased stride length Gait velocity: decr Gait velocity interpretation: Below normal speed for age/gender General Gait Details: pt with R LE drop foot but able to clear foot, fatigues quickly  Stairs Stairs: Yes Stairs assistance: Min guard Stair Management: One rail Left;Alternating pattern (step to during descent leading with R foot) Number of Stairs: 12 General stair comments: pt with safe technique  Wheelchair Mobility     Modified Rankin (Stroke Patients Only)       Balance Overall balance assessment: Needs assistance Sitting-balance support: No upper extremity supported;Feet supported Sitting balance-Leahy Scale: Good Sitting balance - Comments: Performed LB dressing without LOB   Standing balance support: Single extremity supported;During functional activity Standing balance-Leahy Scale: Fair Standing balance comment: spouse assisted with donning/doffing of depends to urinate                             Pertinent Vitals/Pain Pain Assessment: 0-10 Pain Score: 8  Faces Pain Scale: Hurts even more Pain Location: R shoulder elbow, and hand Pain Descriptors / Indicators: Constant;Discomfort;Grimacing;Guarding Pain Intervention(s): Repositioned (adjusted sling)    Home Living Family/patient expects to be discharged to:: Private residence Living Arrangements: Spouse/significant other Available Help at Discharge: Friend(s);Available 24 hours/day Type of Home: House Home Access: Stairs to enter Entrance Stairs-Rails: Psychiatric nurse of Steps: 4-5 Home Layout: One level Home Equipment: Hand held shower head;Cane - single point Additional Comments: pt is a labor and delivery RN at [employer]    Prior Function Level of Independence: Needs assistance   Gait / Transfers Assistance Needed: uses a cane for community walking when on uneven ground  ADL's / Homemaking Assistance Needed: Husband has been assisting with ADLs since injury.  Independent prior to injury with ADLs, IADLs, and works as a Firefighter.         Hand Dominance   Dominant Hand: Right    Extremity/Trunk Assessment   Upper Extremity Assessment Upper Extremity Assessment: RUE deficits/detail RUE Deficits /  Details: R humeral fx with radial nerve injury. Pt casted from upper arm to fingers. Pt able to composite flex fingers and thumb. Unable to extend fingers and thumb RUE: Unable to fully  assess due to immobilization;Unable to fully assess due to pain RUE Coordination: decreased fine motor;decreased gross motor    Lower Extremity Assessment Lower Extremity Assessment: Defer to PT evaluation;RLE deficits/detail RLE Deficits / Details: Pt stating that her R leg is "the problem  child" and has difficulty performing LB ADLs on that side    Cervical / Trunk Assessment Cervical / Trunk Assessment: Normal  Communication   Communication: No difficulties  Cognition Arousal/Alertness: Awake/alert Behavior During Therapy: WFL for tasks assessed/performed Overall Cognitive Status: Within Functional Limits for tasks assessed                                        General Comments General comments (skin integrity, edema, etc.): pt  spouse assist pt in restroom    Exercises     Assessment/Plan    PT Assessment Patient needs continued PT services  PT Problem List Decreased strength;Decreased range of motion;Decreased activity tolerance;Decreased balance;Decreased mobility;Decreased coordination;Pain       PT Treatment Interventions DME instruction;Gait training;Stair training;Functional mobility training;Therapeutic activities;Therapeutic exercise;Balance training    PT Goals (Current goals can be found in the Care Plan section)  Acute Rehab PT Goals Patient Stated Goal: Go home PT Goal Formulation: With patient Time For Goal Achievement: 05/25/17 Potential to Achieve Goals: Good    Frequency Min 3X/week   Barriers to discharge        Co-evaluation               AM-PAC PT "6 Clicks" Daily Activity  Outcome Measure Difficulty turning over in bed (including adjusting bedclothes, sheets and blankets)?: A Little Difficulty moving from lying on back to sitting on the side of the bed? : A Little Difficulty sitting down on and standing up from a chair with arms (e.g., wheelchair, bedside commode, etc,.)?: A Little Help needed moving to and from a  bed to chair (including a wheelchair)?: A Little Help needed walking in hospital room?: A Little Help needed climbing 3-5 steps with a railing? : A Little 6 Click Score: 18    End of Session Equipment Utilized During Treatment: Gait belt Activity Tolerance: Patient tolerated treatment well Patient left: in bed;with call bell/phone within reach;with family/visitor present Nurse Communication: Mobility status PT Visit Diagnosis: Unsteadiness on feet (R26.81)    Time: 3149-7026 PT Time Calculation (min) (ACUTE ONLY): 27 min   Charges:   PT Evaluation $PT Eval Moderate Complexity: 1 Mod PT Treatments $Gait Training: 8-22 mins   PT G Codes:        Kittie Plater, PT, DPT Pager #: [phone] Office #: [phone]   [place] 05/18/2017, 12:37 PM

## 2017-05-19 ENCOUNTER — Other Ambulatory Visit: Payer: Self-pay | Admitting: *Deleted

## 2017-05-19 ENCOUNTER — Encounter: Payer: Self-pay | Admitting: *Deleted

## 2017-05-19 NOTE — Patient Outreach (Signed)
[facility]) Care Management  05/19/2017  Cynthia Nichols November 22, 1960 [id number]   Subjective: Telephone call to patient's home / mobile number, spoke with patient, and HIPAA verified.  Discussed [facility] Care Management [facility]MR Transition of care follow up, patient voiced understanding, and is in agreement to follow up.   Patient states she is doing well, managing with use of one arm status post surgery, has a follow up appointment with surgeon on 05/30/17 and will start outpatient physical therapy on 05/30/17 (at surgeon's office, Dr. Amedeo Plenty).  States she will verify if physical therapy provider is a [facility] facility and is aware that copay may be higher with a non-[facility] facility. States she has hypertension and her blood pressure(runs around 130 /80) is being monitored through MD's office.  Discussed home blood pressure monitoring, patient voices understanding, and states she will discuss with MD in the future, if needed.  Patient voices understanding of medical diagnosis, surgery,  and treatment plan.  States she is accessing the following [facility] benefits: outpatient pharmacy, hospital indemnity (verbally given [name] contact number [phone], contact number for [employer] [phone]) for itemized bill,  she will contact to verify benefit, file claim if appropriate, ), accident supplemental policy (verbally given [employer] contact number [phone]),  and has started the family medical leave act (FMLA) process. Patient states she does not have any education material, transition of care, care coordination, disease management, disease monitoring, transportation, community resource, or pharmacy needs at this time. States she is very appreciative of the follow up and is in agreement to receive [name] Management information.    Objective:  Per chart review, patient hospitalized 05/17/17 - 05/18/17 for Comminuted complex segmental humerus shaft fracture, right  upper extremity, with radial nerve injury.  Status post Open reduction and internal fixation with an extended plate and screw construct, right humerus.  This was a 14-hole S3 [employer] humerus plate with long shaft extension.  An 8-view radiographic series, humerus, elbow, and shoulder. Extensive radial nerve decompression, neurolysis, and exploration, right arm on 05/17/17.  Patient also has a history of IBS, hypertension, Venous aneurysm (right side of neck), and MS (multiple sclerosis).   Assessment: Received [facility]MR Transition of care referral on 05/19/17.  Transition of care follow up completed, no care management needs, and will proceed with case closure.    Plan: RNCM will send patient successful outreach letter, [facility] pamphlet, and magnet. RNCM will send case closure due to follow up completed / no care management needs request to [name] [facility].   Cynthia Nichols H. Annia Friendly, BSN, [facility] Telephonic CM Phone: [phone] Fax: [phone]

## 2017-05-19 NOTE — Anesthesia Postprocedure Evaluation (Signed)
Anesthesia Post Note  Patient: Cynthia Nichols  Procedure(s) Performed: Procedure(s) (LRB): OPEN REDUCTION INTERNAL FIXATION (ORIF) MIDSHAFT RIGHT HUMERUS (Right) EXPLORATION AND EXPOSURE OF RADIAL NERVE RIGHT ARM (Right)     Patient location during evaluation: PACU Anesthesia Type: General Level of consciousness: awake and alert Pain management: pain level controlled Vital Signs Assessment: post-procedure vital signs reviewed and stable Respiratory status: spontaneous breathing, nonlabored ventilation, respiratory function stable and patient connected to nasal cannula oxygen Cardiovascular status: blood pressure returned to baseline and stable Postop Assessment: no signs of nausea or vomiting Anesthetic complications: no    Last Vitals:  Vitals:   05/17/17 2152 05/18/17 0502  BP: (!) 144/74 122/64  Pulse: 80 87  Resp: 15 15  Temp: 37.1 C 37.1 C  SpO2: 100% 100%    Last Pain:  Vitals:   05/18/17 0502  TempSrc: Oral  PainSc:                  [place]

## 2017-05-20 ENCOUNTER — Other Ambulatory Visit: Payer: Self-pay | Admitting: *Deleted

## 2017-05-20 ENCOUNTER — Encounter (HOSPITAL_COMMUNITY): Payer: Self-pay | Admitting: Orthopedic Surgery

## 2017-05-20 NOTE — Patient Outreach (Signed)
[facility]) Care Management  05/20/2017  Cynthia Nichols [birth year] [id number]   Subjective: Received voicemail from patient's home number, with no message.  Telephone call to patient's home / mobile number, spoke with patient, and HIPAA verified.  Discussed [facility] Care Management [facility] Consult follow up, patient voiced understanding, and is in agreement to follow up.   Patient states she had the following questions family medical leave act, absence reporting, and how to file a claim with [place].  RNCM advised patient to follow up with [employer], follow up with manager regarding reporting absences, patient voices understanding, and states she will follow up.  Patient states she has verified that she has the hospital indemnity but was given the run around when she call [employer].  RNCM advised patient to call [employer] back with policy number, request assistance with filing a claim, and or can file a claim on line (verbally given website: [email or web address]).   Patient voices understanding, and is very appreciative of the information, and states she will follow up. Patient states she does not have any education material, transition of care, care coordination, disease management, disease monitoring, transportation, community resource, or pharmacy needs at this time.  Objective:  Per chart review, patient hospitalized 05/17/17 - 05/18/17 for Comminuted complex segmental humerus shaft fracture, right upper extremity, with radial nerve injury.  Status post Open reduction and internal fixation with an extended plate and screw construct, right humerus. This was a 14-hole S3 [employer] humerus plate with long shaft extension.  An 8-view radiographic series, humerus, elbow, and shoulder. Extensive radial nerve decompression, neurolysis, and exploration, right arm on 05/17/17.  Patient also has a history of IBS, hypertension, Venous aneurysm (right side of neck), and MS (multiple  sclerosis).   Assessment: Received UMR Transition of care referral on 05/19/17.  Transition of care follow up completed, [facility] Consult follow up completed, no care management needs, and case will remain closed.    Plan: Case will remain closed and patient will contact this RNCM if additional assistance needed in the future.    Jewelia Bocchino H. Annia Friendly, BSN, [facility] Management [facility] Telephonic CM Phone: [phone] Fax: [phone]

## 2017-05-24 DIAGNOSIS — M25521 Pain in right elbow: Secondary | ICD-10-CM | POA: Diagnosis not present

## 2017-05-24 DIAGNOSIS — M25621 Stiffness of right elbow, not elsewhere classified: Secondary | ICD-10-CM | POA: Diagnosis not present

## 2017-05-24 DIAGNOSIS — W19XXXD Unspecified fall, subsequent encounter: Secondary | ICD-10-CM | POA: Diagnosis not present

## 2017-05-24 DIAGNOSIS — S42351D Displaced comminuted fracture of shaft of humerus, right arm, subsequent encounter for fracture with routine healing: Secondary | ICD-10-CM | POA: Diagnosis not present

## 2017-05-26 ENCOUNTER — Encounter (HOSPITAL_COMMUNITY): Payer: Self-pay | Admitting: Orthopedic Surgery

## 2017-05-30 DIAGNOSIS — M79601 Pain in right arm: Secondary | ICD-10-CM | POA: Diagnosis not present

## 2017-06-02 DIAGNOSIS — M79601 Pain in right arm: Secondary | ICD-10-CM | POA: Diagnosis not present

## 2017-06-06 DIAGNOSIS — M79601 Pain in right arm: Secondary | ICD-10-CM | POA: Diagnosis not present

## 2017-06-10 DIAGNOSIS — M79601 Pain in right arm: Secondary | ICD-10-CM | POA: Diagnosis not present

## 2017-06-13 DIAGNOSIS — M79601 Pain in right arm: Secondary | ICD-10-CM | POA: Diagnosis not present

## 2017-06-16 DIAGNOSIS — S42361D Displaced segmental fracture of shaft of humerus, right arm, subsequent encounter for fracture with routine healing: Secondary | ICD-10-CM | POA: Diagnosis not present

## 2017-06-16 DIAGNOSIS — M79601 Pain in right arm: Secondary | ICD-10-CM | POA: Diagnosis not present

## 2017-06-23 DIAGNOSIS — M79601 Pain in right arm: Secondary | ICD-10-CM | POA: Diagnosis not present

## 2017-06-24 DIAGNOSIS — K589 Irritable bowel syndrome without diarrhea: Secondary | ICD-10-CM | POA: Diagnosis not present

## 2017-06-24 DIAGNOSIS — G35 Multiple sclerosis: Secondary | ICD-10-CM | POA: Diagnosis not present

## 2017-06-24 DIAGNOSIS — Z23 Encounter for immunization: Secondary | ICD-10-CM | POA: Diagnosis not present

## 2017-06-24 DIAGNOSIS — J45909 Unspecified asthma, uncomplicated: Secondary | ICD-10-CM | POA: Diagnosis not present

## 2017-06-28 ENCOUNTER — Ambulatory Visit: Payer: Self-pay

## 2017-06-28 ENCOUNTER — Encounter: Payer: Self-pay | Admitting: Family Medicine

## 2017-06-28 ENCOUNTER — Ambulatory Visit (INDEPENDENT_AMBULATORY_CARE_PROVIDER_SITE_OTHER): Payer: 59 | Admitting: Family Medicine

## 2017-06-28 ENCOUNTER — Telehealth: Payer: Self-pay | Admitting: Family Medicine

## 2017-06-28 VITALS — BP 134/78 | HR 98 | Ht 67.0 in | Wt 159.0 lb

## 2017-06-28 DIAGNOSIS — M79601 Pain in right arm: Secondary | ICD-10-CM

## 2017-06-28 DIAGNOSIS — G5631 Lesion of radial nerve, right upper limb: Secondary | ICD-10-CM | POA: Diagnosis not present

## 2017-06-28 HISTORY — DX: Lesion of radial nerve, right upper limb: G56.31

## 2017-06-28 MED ORDER — VENLAFAXINE HCL ER 37.5 MG PO CP24: 37.5000 mg | ORAL_CAPSULE | Freq: Every day | ORAL | 1 refills | Status: DC

## 2017-06-28 MED ORDER — PREDNISONE 50 MG PO TABS: 50.0000 mg | ORAL_TABLET | Freq: Every day | ORAL | 0 refills | Status: DC

## 2017-06-28 NOTE — Telephone Encounter (Signed)
Pt at check out was reading her AVS and noticed her weight was wrong instead of 189 she believes it was 159, please advise and change if necessary

## 2017-06-28 NOTE — Progress Notes (Signed)
[name] [place], [place] [address] Phone: [phone] Subjective:     CC: Right arm pain and weakness  URK:[id number]  Cynthia Nichols is a 56 y.o. female coming in with complaint of right arm pain and weakness. Patient did fall and unfortunately had a humerus fracture with what appeared to be a radial nerve palsy. Patient was initially seen in the emergency department on 05/13/2017. Found to have a comminuted and displaced right humeral fracture with radial nerve deficit. 4 days later the patient did have an open reduction internal fixation of the mid shaft of the right humerus. He did do a exploration of the radial nerve. The time of surgery there was a concern for potential long-term pulsing in considering and tendon transfer and needed. Patient unfortunately continues to have swelling, severe pain, was on 1200 mg a gabapentin without any significant improvement.Patient states that the pain is waking her up at night, has not notice any significant improvement in range of motion. Patient has not notice any increase in strengthening. This is associated with numbness as well.   Location- Posterior elbow, wrist and hand Duration- Throughout the day but worse at night Character- "Tooth ache"  Aggravating factors- movement Reliving factors- Heat, oxycodone  Therapies tried-  Severity- At its worse 10 "in tears" this morning   Past Medical History:  Diagnosis Date  . Adopted    Patient has no known family history  . Asthma   . Headache(784.0)   . 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 Hypertension   . IBS (irritable bowel syndrome)   . MS (multiple sclerosis) ([place])   . PONV (postoperative nausea and vomiting)   . Venous aneurysm    Right side of neck   Past Surgical History:  Procedure Laterality Date  . ABDOMINAL HYSTERECTOMY    . BREAST MASS EXCISION     Bilateral breast mass excision ( Benign)  . ORIF HUMERUS  FRACTURE Right 05/17/2017   Procedure: OPEN REDUCTION INTERNAL FIXATION (ORIF) MIDSHAFT RIGHT HUMERUS;  Surgeon: Netta Cedars, MD;  Location: [facility];  Service: Orthopedics;  Laterality: Right;  . TENNIS ELBOW RELEASE/NIRSCHEL PROCEDURE Right 05/17/2017   Procedure: EXPLORATION AND EXPOSURE OF RADIAL NERVE RIGHT ARM;  Surgeon: Roseanne Kaufman, MD;  Location: [facility];  Service: Orthopedics;  Laterality: Right;   Social History   Social History  . Marital status: Married    Spouse name: N/A  . Number of children: N/A  . Years of education: N/A   Social History Main Topics  . Smoking status: Never Smoker  . Smokeless tobacco: Never Used  . Alcohol use No  . Drug use: No  . Sexual activity: Not on file   Other Topics Concern  . Not on file   Social History Narrative  . No narrative on file   Allergies  Allergen Reactions  . Ampyra [Dalfampridine] Anaphylaxis and Shortness Of Breath    Chest pain, also   . Imitrex [Sumatriptan] Other (See Comments)    Chest tightness  . Peanut-Containing Drug Products Itching    Itching in the mouth (remarked that she still eats this in limited amounts)  . Shrimp [Shellfish Allergy] Itching    Itching in the mouth (remarked that she still eats this in limited amounts)   Family History  Problem Relation Age of Onset  . Adopted: Yes     Past medical history, social, surgical and family history all reviewed in  electronic medical record.  No pertanent information unless stated regarding to the chief complaint.   Review of Systems:Review of systems updated and as accurate as of 06/28/17  No headache, visual changes, nausea, vomiting, diarrhea, constipation, dizziness, abdominal pain, skin rash, fevers, chills, night sweats, weight loss, swollen lymph nodes, body aches, joint swelling,  chest pain, shortness of breath, mood changes. Positive muscle aches  Objective  Blood pressure (!) 123/45. Systems examined below as of 06/28/17   General: No  apparent distress alert and oriented x3 mood and affect normal, dressed appropriately.  HEENT: Pupils equal, extraocular movements intact  Respiratory: Patient's speak in full sentences and does not appear short of breath  Cardiovascular: No lower extremity edema, non tender, no erythema  Skin: Warm dry intact with no signs of infection or rash on extremities or on axial skeleton.  Abdomen: Soft nontender  Neuro: Cranial nerves II through XII are intact, neurovascularly intact in all extremities with 2+ DTRs and 2+ pulses.  Lymph: No lymphadenopathy of posterior or anterior cervical chain or axillae bilaterally.  Gait mild antalgic gait MSK:  Non tender with full range of motion and good stability and symmetric strength and tone of  hip, knee and ankles bilaterally.  Right arm shows the patient did have a large incision that seems to be well-healed with no significant inflammation. Patient is in a compression dressing from the elbow down. Patient has no wrist extension or finger extension at all. Patient is very minimal extension of the elbow if any. Patient has no strength against any resistance. Patient did have unfortunately numbness on the dorsal aspect of the hand. Decent capillary refill noted. Patient does have flexion of the fingers though.  Limited musculoskeletal ultrasound performed and interpreted by Lyndal Pulley  Limited ultrasound just proximal to the elbow shows the patient does have some mild increase in Doppler flow around the radial nerve in this appreciated. This could be potential some healing. No signs of any infectious etiology. Impression: Successful decompression of radial nerve near the elbow otherwise unremarkable findings.    Impression and Recommendations:     This case required medical decision making of moderate complexity.      Note: This dictation was prepared with Dragon dictation along with smaller phrase technology. Any transcriptional errors that  result from this process are unintentional.

## 2017-06-28 NOTE — Patient Instructions (Addendum)
Good to see you as always.  Prednisone daily for 5 days  Effexor 37.5 mg daily  Continue the gabapentin  Lasix 1/2 pill daily for some of the swelling.  B12 1054mcg daily  B6 200mg  daily  Continue the vitamin D  I am sorry this happen and we will watch it closely.  See me again in 2-3 weeks.

## 2017-06-28 NOTE — Telephone Encounter (Signed)
OV updated with correct weight.

## 2017-06-28 NOTE — Assessment & Plan Note (Signed)
Patient does have what appears to be a palsy noted. Some signs and symptoms corresponding to the injury. Concern that this may not wake up. Suggesting the B12 and B6. Patient also has multiple sclerosis encourage her to continue the same once weekly vitamin D. Patient put on Effexor that can help with some within the nerve pain that patient does having at this time. Even to light sensation. Somewhat of a reflex sympathetic dystrophy. Prednisone given for 5 days as well. Patient as well out from time of surgery and is at no risk of another infection. Discussed with her that I do agree with the hand surgeon that this can take 6-12 months to fully see any type of improvement. We will make sure that it has all the substrate's needed. Follow-up again in 3 weeks.

## 2017-06-29 DIAGNOSIS — M79601 Pain in right arm: Secondary | ICD-10-CM | POA: Diagnosis not present

## 2017-07-04 DIAGNOSIS — M79601 Pain in right arm: Secondary | ICD-10-CM | POA: Diagnosis not present

## 2017-07-06 DIAGNOSIS — S42361D Displaced segmental fracture of shaft of humerus, right arm, subsequent encounter for fracture with routine healing: Secondary | ICD-10-CM | POA: Diagnosis not present

## 2017-07-06 DIAGNOSIS — M79601 Pain in right arm: Secondary | ICD-10-CM | POA: Diagnosis not present

## 2017-07-12 DIAGNOSIS — M79601 Pain in right arm: Secondary | ICD-10-CM | POA: Diagnosis not present

## 2017-07-13 DIAGNOSIS — G43009 Migraine without aura, not intractable, without status migrainosus: Secondary | ICD-10-CM | POA: Diagnosis not present

## 2017-07-13 DIAGNOSIS — R202 Paresthesia of skin: Secondary | ICD-10-CM | POA: Diagnosis not present

## 2017-07-13 DIAGNOSIS — G35 Multiple sclerosis: Secondary | ICD-10-CM | POA: Diagnosis not present

## 2017-07-15 DIAGNOSIS — M79601 Pain in right arm: Secondary | ICD-10-CM | POA: Diagnosis not present

## 2017-07-18 NOTE — Progress Notes (Deleted)
[facility] [place], [place] [address] Phone: [phone] Subjective:     CC: Right arm pain and weaknessWill  YHC:[id number]  Cynthia Nichols is a 56 y.o. female coming in with complaint of right arm pain and weakness. Patient did fall and unfortunately had a humerus fracture with what appeared to be a radial nerve palsy. Patient was initially seen in the emergency department on 05/13/2017. Found to have a comminuted and displaced right humeral fracture with radial nerve deficit. 4 days later the patient did have an open reduction internal fixation of the mid shaft of the right humerus. He did do a exploration of the radial nerve. The time of surgery there was a concern for potential long-term pulsing in considering and tendon transfer and needed. Patient unfortunately continues to have swelling, severe pain, was on 1200 mg a gabapentin without any significant improvement.Patient states that the pain is waking her up at night, has not notice any significant improvement in range of motion. Patient has not notice any increase in strengthening. This is associated with numbness as well.   Update 07/19/2017-patient saw me and was found to have a successful decompression of the radial nerve on ultrasound but didn't have significant increase in Doppler flow. Significant weakness was noted. Patient and flexion of fingers but minimal to none extension.   Past Medical History:  Diagnosis Date  . Adopted    Patient has no known family history  . Asthma   . Headache(784.0)   . 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 Hypertension   . IBS (irritable bowel syndrome)   . MS (multiple sclerosis) ([place])   . PONV (postoperative nausea and vomiting)   . Venous aneurysm    Right side of neck   Past Surgical History:  Procedure Laterality Date  . ABDOMINAL HYSTERECTOMY    . BREAST MASS EXCISION     Bilateral breast mass excision ( Benign)  . ORIF  HUMERUS FRACTURE Right 05/17/2017   Procedure: OPEN REDUCTION INTERNAL FIXATION (ORIF) MIDSHAFT RIGHT HUMERUS;  Surgeon: Netta Cedars, MD;  Location: [facility];  Service: Orthopedics;  Laterality: Right;  . TENNIS ELBOW RELEASE/NIRSCHEL PROCEDURE Right 05/17/2017   Procedure: EXPLORATION AND EXPOSURE OF RADIAL NERVE RIGHT ARM;  Surgeon: Roseanne Kaufman, MD;  Location: [facility];  Service: Orthopedics;  Laterality: Right;   Social History   Social History  . Marital status: Married    Spouse name: N/A  . Number of children: N/A  . Years of education: N/A   Social History Main Topics  . Smoking status: Never Smoker  . Smokeless tobacco: Never Used  . Alcohol use No  . Drug use: No  . Sexual activity: Not on file   Other Topics Concern  . Not on file   Social History Narrative  . No narrative on file   Allergies  Allergen Reactions  . Ampyra [Dalfampridine] Anaphylaxis and Shortness Of Breath    Chest pain, also   . Imitrex [Sumatriptan] Other (See Comments)    Chest tightness  . Peanut-Containing Drug Products Itching    Itching in the mouth (remarked that she still eats this in limited amounts)  . Shrimp [Shellfish Allergy] Itching    Itching in the mouth (remarked that she still eats this in limited amounts)   Family History  Problem Relation Age of Onset  . Adopted: Yes     Past medical history, social, surgical and family history all  reviewed in electronic medical record.  No pertanent information unless stated regarding to the chief complaint.   Review of Systems:Review of systems updated and as accurate as of 07/18/17  No headache, visual changes, nausea, vomiting, diarrhea, constipation, dizziness, abdominal pain, skin rash, fevers, chills, night sweats, weight loss, swollen lymph nodes, body aches, joint swelling,  chest pain, shortness of breath, mood changes. Positive muscle aches  Objective  There were no vitals taken for this visit. Systems examined below as of  07/18/17   General: No apparent distress alert and oriented x3 mood and affect normal, dressed appropriately.  HEENT: Pupils equal, extraocular movements intact  Respiratory: Patient's speak in full sentences and does not appear short of breath  Cardiovascular: No lower extremity edema, non tender, no erythema  Skin: Warm dry intact with no signs of infection or rash on extremities or on axial skeleton.  Abdomen: Soft nontender  Neuro: Cranial nerves II through XII are intact, neurovascularly intact in all extremities with 2+ DTRs and 2+ pulses.  Lymph: No lymphadenopathy of posterior or anterior cervical chain or axillae bilaterally.  Gait mild antalgic gait MSK:  Non tender with full range of motion and good stability and symmetric strength and tone of  hip, knee and ankles bilaterally.  Right arm shows the patient did have a large incision that seems to be well-healed with no significant inflammation. Patient is in a compression dressing from the elbow down. Patient has no wrist extension or finger extension at all. Patient is very minimal extension of the elbow if any. Patient has no strength against any resistance. Patient did have unfortunately numbness on the dorsal aspect of the hand. Decent capillary refill noted. Patient does have flexion of the fingers though.  Limited musculoskeletal ultrasound performed and interpreted by Lyndal Pulley  Limited ultrasound just proximal to the elbow shows the patient does have some mild increase in Doppler flow around the radial nerve in this appreciated. This could be potential some healing. No signs of any infectious etiology. Impression: Successful decompression of radial nerve near the elbow otherwise unremarkable findings.    Impression and Recommendations:     This case required medical decision making of moderate complexity.      Note: This dictation was prepared with Dragon dictation along with smaller phrase technology. Any  transcriptional errors that result from this process are unintentional.

## 2017-07-19 ENCOUNTER — Ambulatory Visit: Payer: Self-pay | Admitting: Family Medicine

## 2017-07-20 DIAGNOSIS — M79601 Pain in right arm: Secondary | ICD-10-CM | POA: Diagnosis not present

## 2017-07-22 DIAGNOSIS — N390 Urinary tract infection, site not specified: Secondary | ICD-10-CM | POA: Diagnosis not present

## 2017-07-22 DIAGNOSIS — R32 Unspecified urinary incontinence: Secondary | ICD-10-CM | POA: Diagnosis not present

## 2017-07-26 DIAGNOSIS — M79601 Pain in right arm: Secondary | ICD-10-CM | POA: Diagnosis not present

## 2017-07-29 DIAGNOSIS — M79621 Pain in right upper arm: Secondary | ICD-10-CM | POA: Diagnosis not present

## 2017-07-29 DIAGNOSIS — M79601 Pain in right arm: Secondary | ICD-10-CM | POA: Diagnosis not present

## 2017-08-03 DIAGNOSIS — M79601 Pain in right arm: Secondary | ICD-10-CM | POA: Diagnosis not present

## 2017-08-05 DIAGNOSIS — M79601 Pain in right arm: Secondary | ICD-10-CM | POA: Diagnosis not present

## 2017-08-08 DIAGNOSIS — M79601 Pain in right arm: Secondary | ICD-10-CM | POA: Diagnosis not present

## 2017-08-17 DIAGNOSIS — J40 Bronchitis, not specified as acute or chronic: Secondary | ICD-10-CM | POA: Diagnosis not present

## 2017-08-17 DIAGNOSIS — N390 Urinary tract infection, site not specified: Secondary | ICD-10-CM | POA: Diagnosis not present

## 2017-08-18 DIAGNOSIS — M79601 Pain in right arm: Secondary | ICD-10-CM | POA: Diagnosis not present

## 2017-08-22 DIAGNOSIS — M79601 Pain in right arm: Secondary | ICD-10-CM | POA: Diagnosis not present

## 2017-08-24 ENCOUNTER — Telehealth: Payer: 59 | Admitting: Family

## 2017-08-24 DIAGNOSIS — J019 Acute sinusitis, unspecified: Secondary | ICD-10-CM

## 2017-08-24 MED ORDER — AMOXICILLIN-POT CLAVULANATE 875-125 MG PO TABS: 1.0000 | ORAL_TABLET | Freq: Two times a day (BID) | ORAL | 0 refills | Status: DC

## 2017-08-24 NOTE — Progress Notes (Signed)

## 2017-08-25 DIAGNOSIS — M79621 Pain in right upper arm: Secondary | ICD-10-CM | POA: Diagnosis not present

## 2017-08-25 DIAGNOSIS — S42361D Displaced segmental fracture of shaft of humerus, right arm, subsequent encounter for fracture with routine healing: Secondary | ICD-10-CM | POA: Diagnosis not present

## 2017-08-31 DIAGNOSIS — R0602 Shortness of breath: Secondary | ICD-10-CM | POA: Diagnosis not present

## 2017-08-31 DIAGNOSIS — R05 Cough: Secondary | ICD-10-CM | POA: Diagnosis not present

## 2017-08-31 DIAGNOSIS — R062 Wheezing: Secondary | ICD-10-CM | POA: Diagnosis not present

## 2017-08-31 DIAGNOSIS — J984 Other disorders of lung: Secondary | ICD-10-CM | POA: Diagnosis not present

## 2017-08-31 DIAGNOSIS — J45909 Unspecified asthma, uncomplicated: Secondary | ICD-10-CM | POA: Diagnosis not present

## 2017-08-31 DIAGNOSIS — M79601 Pain in right arm: Secondary | ICD-10-CM | POA: Diagnosis not present

## 2017-09-08 ENCOUNTER — Encounter: Payer: Self-pay | Admitting: Pulmonary Disease

## 2017-09-08 ENCOUNTER — Ambulatory Visit (INDEPENDENT_AMBULATORY_CARE_PROVIDER_SITE_OTHER): Payer: 59 | Admitting: Pulmonary Disease

## 2017-09-08 DIAGNOSIS — J984 Other disorders of lung: Secondary | ICD-10-CM

## 2017-09-08 DIAGNOSIS — J069 Acute upper respiratory infection, unspecified: Secondary | ICD-10-CM | POA: Diagnosis not present

## 2017-09-08 DIAGNOSIS — M79601 Pain in right arm: Secondary | ICD-10-CM | POA: Diagnosis not present

## 2017-09-08 HISTORY — DX: Other disorders of lung: J98.4

## 2017-09-08 NOTE — Assessment & Plan Note (Signed)
No more antibiotics required  Taper prednisone as directed.  Stay on Symbicort 2 puffs twice daily-rinse mouth after use. Use albuterol as needed only

## 2017-09-08 NOTE — Assessment & Plan Note (Signed)
High-resolution CT scan of the chest to clarify ,  hopefully this will also clarify if fractures are subacute or chronic. Differential diagnosis includes bullous disease, alpha 1 antitrypsin or congenital cystic lung disease in this never smoker  We will try to obtain spirometry from primary care office, if needed will proceed with full pulmonary function testing.

## 2017-09-08 NOTE — Progress Notes (Signed)
Subjective:    Patient ID: Cynthia Nichols, female    DOB: 1961/06/09, 56 y.o.   MRN: [id number]  HPI Chief Complaint  Patient presents with  . Advice Only    Referred by Dr. Charletta Cousin due to mult cysts showing on xray.  Pt states that she thinks she failed a breathing test as well. Denies any complaints of cough, SOB, or CP but does states that her throat is sore and has been like that x3 weeks.    56 year old never smoker, does not drink [facility] labor and delivery, who presents for evaluation of abnormal chest x-ray. She is currently on short-term disability after a fall and a comminuted humerus fracture with radial nerve injury that she sustained in 04/2017.  I reviewed the x-rays from then and this also shows rib fractures on the right side which were not commented on.  She does not remember having chest wall injury or bruising.  She just had URI type symptoms with sinus drainage and saw her PCP initially was given prednisone and then called in to our ER visit program and was given Augmentin with good improvement in her sinus drainage.  Since shortness of breath persisted she saw her PCP again, spirometry apparently showed a mixture of obstruction and restriction and then appeared worse, she was given a second prednisone taper.  Chest x-ray was obtained which showed hyperinflation 12/12 and fractured ribs on the right with chest wall deformity.  There was hyperinflation in the right upper lobe especially appears hyperlucent suggesting bullous emphysema and the radiologist commented on cystic lung disease, hence the referral. She reports that her dyspnea is improved and denies wheezing. She reports a diagnosis of asthma about 10 years ago when she went on allergy testing by Dr. Donneta Romberg, triggers being dust was placed on Symbicort and albuterol however over the past few years she has not needed these medications  She has multiple sclerosis and some weakness in her right leg and burning  of her feet for which she takes gabapentin and is quite highly functional in spite of these. She has long-standing hypertension and is maintained on 40 mg of lisinopril for a long time.  She does report an occasional throat clearing type of cough     Past Medical History:  Diagnosis Date  . Adopted    Patient has no known family history  . Asthma   . Headache(784.0)   . 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 Hypertension   . IBS (irritable bowel syndrome)   . MS (multiple sclerosis) ([place])   . PONV (postoperative nausea and vomiting)   . Venous aneurysm    Right side of neck   Past Surgical History:  Procedure Laterality Date  . ABDOMINAL HYSTERECTOMY    . BREAST MASS EXCISION     Bilateral breast mass excision ( Benign)  . ORIF HUMERUS FRACTURE Right 05/17/2017   Procedure: OPEN REDUCTION INTERNAL FIXATION (ORIF) MIDSHAFT RIGHT HUMERUS;  Surgeon: Netta Cedars, MD;  Location: [facility];  Service: Orthopedics;  Laterality: Right;  . TENNIS ELBOW RELEASE/NIRSCHEL PROCEDURE Right 05/17/2017   Procedure: EXPLORATION AND EXPOSURE OF RADIAL NERVE RIGHT ARM;  Surgeon: Roseanne Kaufman, MD;  Location: [facility];  Service: Orthopedics;  Laterality: Right;    Allergies  Allergen Reactions  . Ampyra [Dalfampridine] Anaphylaxis and Shortness Of Breath    Chest pain, also   . Imitrex [Sumatriptan] Other (See Comments)    Chest tightness  . Peanut-Containing  Drug Products Itching    Itching in the mouth (remarked that she still eats this in limited amounts)  . Shrimp [Shellfish Allergy] Itching    Itching in the mouth (remarked that she still eats this in limited amounts)      Social History   Socioeconomic History  . Marital status: Married    Spouse name: Not on file  . Number of children: Not on file  . Years of education: Not on file  . Highest education level: Not on file  Social Needs  . Financial resource strain: Not on file  . Food insecurity - worry: Not on file    . Food insecurity - inability: Not on file  . Transportation needs - medical: Not on file  . Transportation needs - non-medical: Not on file  Occupational History  . Not on file  Tobacco Use  . Smoking status: Never Smoker  . Smokeless tobacco: Never Used  Substance and Sexual Activity  . Alcohol use: No    Alcohol/week: 0.0 oz  . Drug use: No  . Sexual activity: Not on file  Other Topics Concern  . Not on file  Social History Narrative  . Not on file     Family History  Adopted: Yes     Review of Systems  Constitutional: Negative for fever and unexpected weight change.  HENT: Positive for sore throat. Negative for dental problem, ear pain, nosebleeds, postnasal drip, rhinorrhea, sinus pressure, sneezing and trouble swallowing.   Eyes: Negative for redness and itching.  Respiratory: Negative for cough, chest tightness, shortness of breath and wheezing.   Cardiovascular: Negative for palpitations and leg swelling.  Gastrointestinal: Negative for nausea and vomiting.  Genitourinary: Negative for dysuria.  Musculoskeletal: Negative for joint swelling.  Skin: Negative for rash.  Allergic/Immunologic: Positive for environmental allergies and food allergies. Negative for immunocompromised state.  Neurological: Negative for headaches.  Hematological: Does not bruise/bleed easily.  Psychiatric/Behavioral: Negative for dysphoric mood. The patient is not nervous/anxious.        Objective:   Physical Exam  Gen. Pleasant, well-nourished, in no distress, normal affect ENT - no lesions, no post nasal drip Neck: No JVD, no thyromegaly, no carotid bruits Lungs: no use of accessory muscles, no dullness to percussion,decreased  without rales or rhonchi  Cardiovascular: Rhythm regular, heart sounds  normal, no murmurs or gallops, no peripheral edema Abdomen: soft and non-tender, no hepatosplenomegaly, BS normal. Musculoskeletal: No deformities, no cyanosis or clubbing, rt fore arm  cast Neuro:  alert, non focal        Assessment & Plan:

## 2017-09-08 NOTE — Patient Instructions (Signed)
High-resolution CT scan of the chest. We will try to obtain spirometry from primary care office, if needed will proceed with full pulmonary function testing.  Taper prednisone as directed.  Stay on Symbicort 2 puffs twice daily-rinse mouth after use. Use albuterol as needed only

## 2017-09-14 DIAGNOSIS — M79601 Pain in right arm: Secondary | ICD-10-CM | POA: Diagnosis not present

## 2017-09-19 ENCOUNTER — Ambulatory Visit (INDEPENDENT_AMBULATORY_CARE_PROVIDER_SITE_OTHER)
Admission: RE | Admit: 2017-09-19 | Discharge: 2017-09-19 | Disposition: A | Discharge status: Home / Self Care | Payer: 59 | Source: Ambulatory Visit | Attending: Pulmonary Disease | Admitting: Pulmonary Disease

## 2017-09-19 DIAGNOSIS — R918 Other nonspecific abnormal finding of lung field: Secondary | ICD-10-CM | POA: Diagnosis not present

## 2017-09-19 DIAGNOSIS — J984 Other disorders of lung: Secondary | ICD-10-CM

## 2017-09-21 DIAGNOSIS — R300 Dysuria: Secondary | ICD-10-CM | POA: Diagnosis not present

## 2017-09-21 DIAGNOSIS — N3090 Cystitis, unspecified without hematuria: Secondary | ICD-10-CM | POA: Diagnosis not present

## 2017-09-22 ENCOUNTER — Other Ambulatory Visit: Payer: Self-pay | Admitting: Pulmonary Disease

## 2017-09-22 DIAGNOSIS — R911 Solitary pulmonary nodule: Secondary | ICD-10-CM

## 2017-09-26 DIAGNOSIS — R29898 Other symptoms and signs involving the musculoskeletal system: Secondary | ICD-10-CM | POA: Diagnosis not present

## 2017-09-26 DIAGNOSIS — M25631 Stiffness of right wrist, not elsewhere classified: Secondary | ICD-10-CM | POA: Diagnosis not present

## 2017-09-26 DIAGNOSIS — M25611 Stiffness of right shoulder, not elsewhere classified: Secondary | ICD-10-CM | POA: Diagnosis not present

## 2017-09-28 DIAGNOSIS — G35 Multiple sclerosis: Secondary | ICD-10-CM | POA: Diagnosis not present

## 2017-09-28 DIAGNOSIS — Z79899 Other long term (current) drug therapy: Secondary | ICD-10-CM | POA: Diagnosis not present

## 2017-09-29 DIAGNOSIS — G35 Multiple sclerosis: Secondary | ICD-10-CM | POA: Diagnosis not present

## 2017-09-29 DIAGNOSIS — G5631 Lesion of radial nerve, right upper limb: Secondary | ICD-10-CM | POA: Diagnosis not present

## 2017-09-29 DIAGNOSIS — S42361D Displaced segmental fracture of shaft of humerus, right arm, subsequent encounter for fracture with routine healing: Secondary | ICD-10-CM | POA: Diagnosis not present

## 2017-10-03 DIAGNOSIS — R29898 Other symptoms and signs involving the musculoskeletal system: Secondary | ICD-10-CM | POA: Diagnosis not present

## 2017-10-06 ENCOUNTER — Ambulatory Visit: Payer: 59 | Admitting: Pulmonary Disease

## 2017-10-06 ENCOUNTER — Encounter: Payer: Self-pay | Admitting: Pulmonary Disease

## 2017-10-06 VITALS — BP 122/66 | HR 76 | Ht 67.0 in | Wt 162.0 lb

## 2017-10-06 DIAGNOSIS — R0602 Shortness of breath: Secondary | ICD-10-CM

## 2017-10-06 DIAGNOSIS — R911 Solitary pulmonary nodule: Secondary | ICD-10-CM | POA: Diagnosis not present

## 2017-10-06 DIAGNOSIS — J984 Other disorders of lung: Secondary | ICD-10-CM

## 2017-10-06 DIAGNOSIS — R918 Other nonspecific abnormal finding of lung field: Secondary | ICD-10-CM | POA: Diagnosis not present

## 2017-10-06 NOTE — Assessment & Plan Note (Signed)
No evidence of cystic or bullous disease in the lung. Spirometry only shows mild restriction. No medications required

## 2017-10-06 NOTE — Patient Instructions (Addendum)
Lung function is slight decreased at 74% Ct chest no contrast in 6 months

## 2017-10-06 NOTE — Progress Notes (Signed)
   Subjective:    Patient ID: Cynthia Nichols, female    DOB: 1961/08/05, 57 y.o.   MRN: [id number]  HPI  57 year old never smoker, for FU of abnormal CXR showing hyperinflation She is currently on short-term disability after a fall and a comminuted humerus fracture with radial nerve injury & right rib fractures that she sustained in 04/2017.    She reports a diagnosis of asthma about 10 years ago when she went on allergy testing by Dr. Donneta Romberg, triggers being dust was placed on Symbicort and albuterol however over the past few years she has not needed these medications  She has multiple sclerosis and some weakness in her right leg and burning of her feet for which she takes gabapentin and is quite highly functional in spite of these. She has long-standing hypertension and is maintained on 40 mg of lisinopril for a long time.  She does report an occasional throat clearing type of cough  Feels much improved about 90%, has a lingering throat clearing type of cough we reviewed CT results.    Significant tests/ events reviewed  12/ 2018 spirometry with PCP apparently showed a mixture of obstruction and restriction  09/2017 spirometry shows mild restriction with ratio 77, FEV1 72% FVC 74%  HRCT 08/2017-mild reticular nodular infiltrate right base, old granulomatous disease, subcentimeter nodules largest 6 mm Review of Systems neg for any significant sore throat, dysphagia, itching, sneezing, nasal congestion or excess/ purulent secretions, fever, chills, sweats, unintended wt loss, pleuritic or exertional cp, hempoptysis, orthopnea pnd or change in chronic leg swelling. Also denies presyncope, palpitations, heartburn, abdominal pain, nausea, vomiting, diarrhea or change in bowel or urinary habits, dysuria,hematuria, rash, arthralgias, visual complaints, headache, numbness weakness or ataxia.     Objective:   Physical Exam   Gen. Pleasant, well-nourished, in no distress ENT - no thrush, no  post nasal drip Neck: No JVD, no thyromegaly, no carotid bruits Lungs: no use of accessory muscles, no dullness to percussion, clear without rales or rhonchi  Cardiovascular: Rhythm regular, heart sounds  normal, no murmurs or gallops, no peripheral edema Musculoskeletal: No deformities, no cyanosis or clubbing , right hand weakness      Assessment & Plan:

## 2017-10-06 NOTE — Assessment & Plan Note (Signed)
Probably old granulomatous disease. We will follow-up CT chest without contrast in 6 months

## 2017-10-07 DIAGNOSIS — N390 Urinary tract infection, site not specified: Secondary | ICD-10-CM | POA: Diagnosis not present

## 2017-10-10 DIAGNOSIS — R29898 Other symptoms and signs involving the musculoskeletal system: Secondary | ICD-10-CM | POA: Diagnosis not present

## 2017-10-10 DIAGNOSIS — M25611 Stiffness of right shoulder, not elsewhere classified: Secondary | ICD-10-CM | POA: Diagnosis not present

## 2017-10-17 ENCOUNTER — Ambulatory Visit: Payer: Self-pay

## 2017-10-17 DIAGNOSIS — M25611 Stiffness of right shoulder, not elsewhere classified: Secondary | ICD-10-CM | POA: Diagnosis not present

## 2017-10-17 DIAGNOSIS — R29898 Other symptoms and signs involving the musculoskeletal system: Secondary | ICD-10-CM | POA: Diagnosis not present

## 2017-10-18 DIAGNOSIS — R300 Dysuria: Secondary | ICD-10-CM | POA: Diagnosis not present

## 2017-10-18 DIAGNOSIS — G35 Multiple sclerosis: Secondary | ICD-10-CM | POA: Diagnosis not present

## 2017-10-18 DIAGNOSIS — R3915 Urgency of urination: Secondary | ICD-10-CM | POA: Diagnosis not present

## 2017-10-18 DIAGNOSIS — R152 Fecal urgency: Secondary | ICD-10-CM | POA: Diagnosis not present

## 2017-10-21 ENCOUNTER — Inpatient Hospital Stay: Payer: 59 | Attending: Oncology | Admitting: Oncology

## 2017-10-21 ENCOUNTER — Inpatient Hospital Stay: Payer: 59 | Attending: Oncology

## 2017-10-21 ENCOUNTER — Encounter: Payer: Self-pay | Admitting: Oncology

## 2017-10-21 VITALS — BP 113/70 | HR 80 | Resp 20

## 2017-10-21 VITALS — BP 127/78 | HR 73 | Temp 97.8°F | Resp 20 | Wt 164.7 lb

## 2017-10-21 DIAGNOSIS — I10 Essential (primary) hypertension: Secondary | ICD-10-CM

## 2017-10-21 DIAGNOSIS — G35 Multiple sclerosis: Secondary | ICD-10-CM | POA: Diagnosis not present

## 2017-10-21 DIAGNOSIS — Z79899 Other long term (current) drug therapy: Secondary | ICD-10-CM | POA: Insufficient documentation

## 2017-10-21 DIAGNOSIS — K589 Irritable bowel syndrome without diarrhea: Secondary | ICD-10-CM

## 2017-10-21 DIAGNOSIS — J45909 Unspecified asthma, uncomplicated: Secondary | ICD-10-CM | POA: Diagnosis not present

## 2017-10-21 MED ORDER — SODIUM CHLORIDE 0.9 % IV SOLN
Freq: Once | INTRAVENOUS | Status: AC
  Administered 2017-10-21: 11:00:00 via INTRAVENOUS
  Filled 2017-10-21: qty 1000

## 2017-10-21 MED ORDER — ACETAMINOPHEN 500 MG PO TABS
1000.0000 mg | ORAL_TABLET | Freq: Once | ORAL | Status: AC
  Administered 2017-10-21: 1000 mg via ORAL
  Filled 2017-10-21: qty 2

## 2017-10-21 MED ORDER — SODIUM CHLORIDE 0.9 % IV SOLN
300.0000 mg | Freq: Once | INTRAVENOUS | Status: AC
  Administered 2017-10-21: 300 mg via INTRAVENOUS
  Filled 2017-10-21: qty 15

## 2017-10-21 MED ORDER — DIPHENHYDRAMINE HCL 50 MG/ML IJ SOLN
50.0000 mg | Freq: Once | INTRAMUSCULAR | Status: AC
  Administered 2017-10-21: 50 mg via INTRAVENOUS
  Filled 2017-10-21: qty 1

## 2017-10-21 NOTE — Progress Notes (Signed)
Patient here today for initial evaluation regarding MS, Tysabri infusions.

## 2017-10-21 NOTE — Progress Notes (Signed)
[facility]  [phone] [phone]  ID: Cynthia Nichols OB: 16-Mar-1961  MR#: [id number]  YPP#:[id number]  Patient Care Team: Gardner Candle, MD (Inactive) as PCP - General (Family Medicine) Sherlyn Lees, [place] as Nurse Practitioner (Family Medicine) Wilfrid Lund, DO (Neurology) Charletta Cousin, MD (Inactive) as Attending Physician (Family Medicine)  CHIEF COMPLAINT: Multiple sclerosis requiring Tysabri infusion.  INTERVAL HISTORY: Patient is a 57 year old female with a long-standing history of multiple sclerosis who recently was under a different treatment which she could not tolerate secondary to GI upset.  She is now being transitioned to Tysabri infusions.  Currently she feels well and is at her baseline.  She has no new neurologic complaints.  She denies any recent fevers or illnesses.  She has a good appetite and denies weight loss.  She has no chest pain or shortness of breath.  She denies any nausea, vomiting, constipation, or diarrhea.  She has no urinary complaints.  Patient feels at her baseline and offers no specific complaints today.    REVIEW OF SYSTEMS:   Review of Systems  Constitutional: Negative.  Negative for fever, malaise/fatigue and weight loss.  Respiratory: Negative.  Negative for cough and shortness of breath.   Cardiovascular: Negative.  Negative for chest pain and leg swelling.  Gastrointestinal: Negative.  Negative for abdominal pain.  Genitourinary: Negative.   Musculoskeletal: Negative.   Skin: Negative.  Negative for rash.  Neurological: Positive for focal weakness. Negative for sensory change and weakness.  Psychiatric/Behavioral: The patient is nervous/anxious.     As per HPI. Otherwise, a complete review of systems is negative.  PAST MEDICAL HISTORY: Past Medical History:  Diagnosis Date  . Adopted    Patient has no known family history  . Asthma   . Headache(784.0)   . Humerus fracture    right with  radial nerve palsy  . Hypertension   . IBS (irritable bowel syndrome)   . MS (multiple sclerosis) ([place])   . PONV (postoperative nausea and vomiting)   . Venous aneurysm    Right side of neck    PAST SURGICAL HISTORY: Past Surgical History:  Procedure Laterality Date  . ABDOMINAL HYSTERECTOMY    . BREAST MASS EXCISION     Bilateral breast mass excision ( Benign)  . ORIF HUMERUS FRACTURE Right 05/17/2017   Procedure: OPEN REDUCTION INTERNAL FIXATION (ORIF) MIDSHAFT RIGHT HUMERUS;  Surgeon: Netta Cedars, MD;  Location: [facility];  Service: Orthopedics;  Laterality: Right;  . TENNIS ELBOW RELEASE/NIRSCHEL PROCEDURE Right 05/17/2017   Procedure: EXPLORATION AND EXPOSURE OF RADIAL NERVE RIGHT ARM;  Surgeon: Roseanne Kaufman, MD;  Location: [facility];  Service: Orthopedics;  Laterality: Right;    FAMILY HISTORY: Family History  Adopted: Yes    ADVANCED DIRECTIVES (Y/N):  N  HEALTH MAINTENANCE: Social History   Tobacco Use  . Smoking status: Never Smoker  . Smokeless tobacco: Never Used  Substance Use Topics  . Alcohol use: No    Alcohol/week: 0.0 oz  . Drug use: No     Colonoscopy:  PAP:  Bone density:  Lipid panel:  Allergies  Allergen Reactions  . Ampyra [Dalfampridine] Anaphylaxis and Shortness Of Breath    Chest pain, also   . Imitrex [Sumatriptan] Other (See Comments)    Chest tightness  . Peanut-Containing Drug Products Itching    Itching in the mouth (remarked that she still eats this in limited amounts)  . Shrimp [Shellfish Allergy] Itching    Itching in the mouth (  remarked that she still eats this in limited amounts)    Current Outpatient Medications  Medication Sig Dispense Refill  . albuterol (PROVENTIL HFA) 108 (90 Base) MCG/ACT inhaler Inhale into the lungs.    . bisoprolol (ZEBETA) 5 MG tablet Take 2.5 mg by mouth daily.    . budesonide-formoterol (SYMBICORT) 160-4.5 MCG/ACT inhaler Inhale 2 puffs into the lungs 2 (two) times daily.    .  butalbital-acetaminophen-caffeine (FIORICET, ESGIC) 50-325-40 MG per tablet Take 1 tablet by mouth 2 (two) times daily as needed for migraine.     . Cholecalciferol (VITAMIN D3 SUPER STRENGTH) 2000 units TABS Take 4,000 Units by mouth.    . gabapentin (NEURONTIN) 800 MG tablet Take 400-800 mg by mouth 2 (two) times daily. Take 400 mg in the morning and 800 mg at night    . lisinopril (PRINIVIL,ZESTRIL) 40 MG tablet Take 40 mg by mouth at bedtime.     Marland Kitchen zolpidem (AMBIEN) 5 MG tablet Take 5 mg by mouth at bedtime.     Marland Kitchen zonisamide (ZONEGRAN) 100 MG capsule Take 200 mg by mouth at bedtime.    . methocarbamol (ROBAXIN) 500 MG tablet Take 1 tablet (500 mg total) by mouth 3 (three) times daily as needed. (Patient not taking: Reported on 10/21/2017) 60 tablet 1   No current facility-administered medications for this visit.     OBJECTIVE: Vitals:   10/21/17 0946  BP: 127/78  Pulse: 73  Resp: 20  Temp: 97.8 F (36.6 C)     Body mass index is 25.79 kg/m.    ECOG FS:1 - Symptomatic but completely ambulatory  General: Well-developed, well-nourished, no acute distress. Eyes: Pink conjunctiva, anicteric sclera. HEENT: Normocephalic, moist mucous membranes, clear oropharnyx. Lungs: Clear to auscultation bilaterally. Heart: Regular rate and rhythm. No rubs, murmurs, or gallops. Abdomen: Soft, nontender, nondistended. No organomegaly noted, normoactive bowel sounds. Musculoskeletal: No edema, cyanosis, or clubbing. Neuro: Alert, answering all questions appropriately. Cranial nerves grossly intact. Skin: No rashes or petechiae noted. Psych: Normal affect. Lymphatics: No cervical, calvicular, axillary or inguinal LAD.   LAB RESULTS:  Lab Results  Component Value Date   NA 139 05/14/2017   K 4.0 05/14/2017   CL 109 05/14/2017   CO2 21 (L) 05/14/2017   GLUCOSE 167 (H) 05/14/2017   BUN 16 05/14/2017   CREATININE 0.82 05/14/2017   CALCIUM 9.3 05/14/2017   GFRNONAA >60 05/14/2017   GFRAA >60  05/14/2017    Lab Results  Component Value Date   WBC 5.6 05/17/2017   HGB 11.7 (L) 05/17/2017   HCT 35.4 (L) 05/17/2017   MCV 92.7 05/17/2017   PLT 196 05/17/2017     STUDIES: No results found.  ASSESSMENT: Multiple sclerosis requiring Tysabri infusion.  PLAN:    1. Multiple sclerosis requiring Tysabri infusion: Proceed with 300 mg IV Tysabri every 28 days.  Patient will receive 50 mg IV Benadryl and 650 mg p.o Tylenol as premedications.  She expressed understanding that any questions regarding her multiple sclerosis or her infusions should be directed towards her primary neurologist, Dr. Pearletha Forge.  All laboratory work, including JC virus, will also be monitored by him.  Return to clinic monthly for her infusions and then in 6 months for further evaluation.  Approximately 45 minutes was spent in discussion of which greater than 50% was consultation.  Patient expressed understanding and was in agreement with this plan. She also understands that She can call clinic at any time with any questions, concerns, or complaints.  Lloyd Huger, MD   10/21/2017 10:59 AM

## 2017-10-21 NOTE — Patient Instructions (Signed)

## 2017-10-24 DIAGNOSIS — Z01419 Encounter for gynecological examination (general) (routine) without abnormal findings: Secondary | ICD-10-CM | POA: Diagnosis not present

## 2017-10-24 DIAGNOSIS — Z1231 Encounter for screening mammogram for malignant neoplasm of breast: Secondary | ICD-10-CM | POA: Diagnosis not present

## 2017-10-24 DIAGNOSIS — Z6826 Body mass index (BMI) 26.0-26.9, adult: Secondary | ICD-10-CM | POA: Diagnosis not present

## 2017-10-24 DIAGNOSIS — Z13820 Encounter for screening for osteoporosis: Secondary | ICD-10-CM | POA: Diagnosis not present

## 2017-10-25 ENCOUNTER — Ambulatory Visit: Payer: Self-pay | Admitting: Family Medicine

## 2017-10-25 DIAGNOSIS — R29898 Other symptoms and signs involving the musculoskeletal system: Secondary | ICD-10-CM | POA: Diagnosis not present

## 2017-10-25 DIAGNOSIS — M25611 Stiffness of right shoulder, not elsewhere classified: Secondary | ICD-10-CM | POA: Diagnosis not present

## 2017-10-25 LAB — HM PAP SMEAR: HM PAP: NORMAL

## 2017-10-27 ENCOUNTER — Ambulatory Visit: Payer: Self-pay

## 2017-10-27 ENCOUNTER — Ambulatory Visit: Payer: Self-pay | Admitting: Oncology

## 2017-10-28 ENCOUNTER — Encounter: Payer: Self-pay | Admitting: Neurology

## 2017-10-28 DIAGNOSIS — J101 Influenza due to other identified influenza virus with other respiratory manifestations: Secondary | ICD-10-CM | POA: Diagnosis not present

## 2017-11-03 DIAGNOSIS — H1045 Other chronic allergic conjunctivitis: Secondary | ICD-10-CM | POA: Diagnosis not present

## 2017-11-03 DIAGNOSIS — J301 Allergic rhinitis due to pollen: Secondary | ICD-10-CM | POA: Diagnosis not present

## 2017-11-03 DIAGNOSIS — J3089 Other allergic rhinitis: Secondary | ICD-10-CM | POA: Diagnosis not present

## 2017-11-03 DIAGNOSIS — J4530 Mild persistent asthma, uncomplicated: Secondary | ICD-10-CM | POA: Diagnosis not present

## 2017-11-07 DIAGNOSIS — R29898 Other symptoms and signs involving the musculoskeletal system: Secondary | ICD-10-CM | POA: Diagnosis not present

## 2017-11-07 DIAGNOSIS — M25611 Stiffness of right shoulder, not elsewhere classified: Secondary | ICD-10-CM | POA: Diagnosis not present

## 2017-11-14 DIAGNOSIS — R29898 Other symptoms and signs involving the musculoskeletal system: Secondary | ICD-10-CM | POA: Diagnosis not present

## 2017-11-15 DIAGNOSIS — N319 Neuromuscular dysfunction of bladder, unspecified: Secondary | ICD-10-CM | POA: Diagnosis not present

## 2017-11-16 DIAGNOSIS — Z13220 Encounter for screening for lipoid disorders: Secondary | ICD-10-CM | POA: Diagnosis not present

## 2017-11-16 DIAGNOSIS — M818 Other osteoporosis without current pathological fracture: Secondary | ICD-10-CM | POA: Diagnosis not present

## 2017-11-16 DIAGNOSIS — Z13228 Encounter for screening for other metabolic disorders: Secondary | ICD-10-CM | POA: Diagnosis not present

## 2017-11-18 ENCOUNTER — Inpatient Hospital Stay: Payer: 59 | Attending: Oncology

## 2017-11-18 VITALS — BP 152/103 | HR 66 | Temp 97.6°F | Resp 20

## 2017-11-18 DIAGNOSIS — G35 Multiple sclerosis: Secondary | ICD-10-CM | POA: Diagnosis not present

## 2017-11-18 DIAGNOSIS — Z79899 Other long term (current) drug therapy: Secondary | ICD-10-CM | POA: Diagnosis not present

## 2017-11-18 MED ORDER — SODIUM CHLORIDE 0.9 % IV SOLN
300.0000 mg | Freq: Once | INTRAVENOUS | Status: AC
  Administered 2017-11-18: 300 mg via INTRAVENOUS
  Filled 2017-11-18: qty 15

## 2017-11-18 MED ORDER — SODIUM CHLORIDE 0.9 % IV SOLN
Freq: Once | INTRAVENOUS | Status: AC
  Administered 2017-11-18: 10:00:00 via INTRAVENOUS
  Filled 2017-11-18: qty 1000

## 2017-11-18 MED ORDER — ACETAMINOPHEN 500 MG PO TABS
1000.0000 mg | ORAL_TABLET | Freq: Once | ORAL | Status: AC
  Administered 2017-11-18: 1000 mg via ORAL
  Filled 2017-11-18: qty 2

## 2017-11-18 NOTE — Patient Instructions (Signed)

## 2017-11-18 NOTE — Progress Notes (Signed)
Patient requested to not receive benadryl premed today.  Patient advised that she could ask for it at any time while she is here if she feels like she needs it.  Patient verbalized understanding.  Patient went to the bathroom during the Tysabri infusion and was in the bathroom for around 20 minutes.  She relayed that she was fine.  Upon returning to her treatment chair she asked for crackers.  She verbalized that her stomach was upset.  She ate a few crackers and felt better.  Her blood pressure was elevated after returning to the chair and at end of treatment  - 152/103.  Heartrate was normal at 66.  She was advised to keep a check on her blood pressure.  Her left eye became red when she stood up to put her jacket on.  She is, in fact, headed to another doctor appointment and stated that she would have them check her blood pressure and eye.  Her husband was at her side and escorted her out to the car.

## 2017-11-24 DIAGNOSIS — N390 Urinary tract infection, site not specified: Secondary | ICD-10-CM | POA: Diagnosis not present

## 2017-11-24 DIAGNOSIS — N3000 Acute cystitis without hematuria: Secondary | ICD-10-CM | POA: Diagnosis not present

## 2017-11-24 DIAGNOSIS — B961 Klebsiella pneumoniae [K. pneumoniae] as the cause of diseases classified elsewhere: Secondary | ICD-10-CM | POA: Diagnosis not present

## 2017-11-24 DIAGNOSIS — N3946 Mixed incontinence: Secondary | ICD-10-CM | POA: Diagnosis not present

## 2017-11-28 DIAGNOSIS — R29898 Other symptoms and signs involving the musculoskeletal system: Secondary | ICD-10-CM | POA: Diagnosis not present

## 2017-11-29 ENCOUNTER — Ambulatory Visit: Payer: 59 | Admitting: Family Medicine

## 2017-11-29 ENCOUNTER — Encounter: Payer: Self-pay | Admitting: Family Medicine

## 2017-11-29 DIAGNOSIS — M810 Age-related osteoporosis without current pathological fracture: Secondary | ICD-10-CM | POA: Diagnosis not present

## 2017-11-29 DIAGNOSIS — I720 Aneurysm of carotid artery: Secondary | ICD-10-CM

## 2017-11-29 DIAGNOSIS — I700 Atherosclerosis of aorta: Secondary | ICD-10-CM

## 2017-11-29 DIAGNOSIS — G35 Multiple sclerosis: Secondary | ICD-10-CM

## 2017-11-29 DIAGNOSIS — Z5181 Encounter for therapeutic drug level monitoring: Secondary | ICD-10-CM | POA: Diagnosis not present

## 2017-11-29 DIAGNOSIS — R918 Other nonspecific abnormal finding of lung field: Secondary | ICD-10-CM

## 2017-11-29 DIAGNOSIS — I10 Essential (primary) hypertension: Secondary | ICD-10-CM | POA: Diagnosis not present

## 2017-11-29 DIAGNOSIS — E782 Mixed hyperlipidemia: Secondary | ICD-10-CM

## 2017-11-29 DIAGNOSIS — S42351D Displaced comminuted fracture of shaft of humerus, right arm, subsequent encounter for fracture with routine healing: Secondary | ICD-10-CM

## 2017-11-29 HISTORY — DX: Atherosclerosis of aorta: I70.0

## 2017-11-29 HISTORY — DX: Age-related osteoporosis without current pathological fracture: M81.0

## 2017-11-29 HISTORY — DX: Essential (primary) hypertension: I10

## 2017-11-29 MED ORDER — ROSUVASTATIN CALCIUM 10 MG PO TABS: 10.0000 mg | ORAL_TABLET | Freq: Every day | ORAL | 1 refills | Status: DC

## 2017-11-29 NOTE — Assessment & Plan Note (Signed)
Discussed with patient; noted on chest CT; recommended low dose crestor and then recheck lipids in 6 weeks

## 2017-11-29 NOTE — Assessment & Plan Note (Signed)
Will continue current meds; request labs done at GYN to check Cr and K+ on her meds

## 2017-11-29 NOTE — Assessment & Plan Note (Signed)
Managed by vascular  

## 2017-11-29 NOTE — Progress Notes (Signed)
BP 124/78   Pulse 89   Temp (!) 97.5 F (36.4 C) (Oral)   Resp 14   Ht 5' 7.13" (1.705 m)   Wt 158 lb 9.6 oz (71.9 kg)   SpO2 96%   BMI 24.75 kg/m    Subjective:    Patient ID: Cynthia Nichols, female    DOB: 1961-09-13, 57 y.o.   MRN: [id number]  HPI: OBERA STAUCH is a 57 y.o. female  Chief Complaint  Patient presents with  . New Patient (Initial Visit)  . Establish Care    HPI Patient is here to establish care Asthma; pretty well-controlled; has not been a big issue until she got a cold, then it hit her hard; just last fall No asthma as a child, did not grow up with it; did breathing test and had CXR Multiple nodules; sent her to the pulmonologist; Jan 2019, Dr. Elsworth Soho High resolution chest CT done 09/19/2017: IMPRESSION: 1. 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 Consider attention on follow-up chest CT or chest radiographs in 6 months. 2. Scattered solid pulmonary nodules in both lungs, largest 6 mm in the lingula. Non-contrast chest CT at 3-6 months is recommended. If the nodules are stable at time of repeat CT, then future CT at 18-24 months (from today's scan) is considered optional for low-risk patients, but is recommended for high-risk patients. This recommendation follows the consensus statement: Guidelines for Management of Incidental Pulmonary Nodules Detected on CT Images: From the Fleischner Society 2017; Radiology 2017; 284:228-243. 3. Mild patchy air trapping in the lungs, suggesting small airways disease.  Aortic Atherosclerosis (ICD10-I70.0).   Electronically Signed   By: Ilona Sorrel M.D.   On: 09/19/2017 14:43  Aortic athero; noted on CT scan; LDL in February 2019 was 149; HDL 74, TG 115, total chol 246 She has seen Dr. 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 Amedeo Plenty  HTN; well-controlled today; no cuff at home, but good at the last few visits  MS; last BP at [facility] infusion 150/100 on the same side as the injury, wrist cuff  Right venous aneurysm; followed by Dr. Donnetta Hutching; has had it for years; just following  She has osteoporosis; not enough green leafy vegetables; not much calcium; fall precautions  Taking fioricet for migraines PRN  Ambien at bedtime; I explained that I will not prescribe that; she is aware  Depression screen [facility] 2/9 11/29/2017  Decreased Interest 0  Down, Depressed, Hopeless 0  PHQ - 2 Score 0    Relevant past medical, surgical, family and social history reviewed Past Medical History:  Diagnosis Date  . Adopted    Patient has no known family history  . Asthma   . Headache(784.0)   . 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 Hypertension   . IBS (irritable bowel syndrome)   . MS (multiple sclerosis) ([place])   . PONV (postoperative nausea and vomiting)   . Venous aneurysm    Right side of neck   Past Surgical History:  Procedure Laterality Date  . ABDOMINAL HYSTERECTOMY    . BREAST MASS EXCISION     Bilateral breast mass excision ( [place])  . ORIF HUMERUS FRACTURE Right 05/17/2017  . TENNIS ELBOW RELEASE/NIRSCHEL PROCEDURE Right 05/17/2017   Procedure: EXPLORATION AND EXPOSURE OF RADIAL NERVE RIGHT ARM;  Surgeon: Roseanne Kaufman, MD;  Location: [place];  Service: Orthopedics;  Laterality: Right;   Family History  Adopted: Yes   Social History   Tobacco Use  . Smoking status: Never Smoker  . Smokeless tobacco: Never Used  Substance Use Topics  . Alcohol use: No    Alcohol/week: 0.0 oz  . Drug use: No    Interim medical history since last visit reviewed. Allergies and medications reviewed  Review of Systems Per HPI unless specifically indicated above     Objective:    BP 124/78   Pulse 89   Temp (!) 97.5 F (36.4 C) (Oral)    Resp 14   Ht 5' 7.13" (1.705 m)   Wt 158 lb 9.6 oz (71.9 kg)   SpO2 96%   BMI 24.75 kg/m   Wt Readings from Last 3 Encounters:  11/29/17 158 lb 9.6 oz (71.9 kg)  10/21/17 164 lb 10.9 oz (74.7 kg)  10/06/17 162 lb (73.5 kg)    Physical Exam  Constitutional: She appears well-developed and well-nourished. No distress.  HENT:  Head: Normocephalic and atraumatic.  Eyes: EOM are normal. No scleral icterus.  Neck: No thyromegaly present.  Cardiovascular: Normal rate, regular rhythm and normal heart sounds.  No murmur heard. Pulmonary/Chest: Effort normal and breath sounds normal. No respiratory distress. She has no wheezes.  Abdominal: Soft. Bowel sounds are normal. She exhibits no distension.  Musculoskeletal: Normal range of motion. She exhibits no edema.  Surgical scar right arm; limited ROM with right forearm and wrist  Neurological: She is alert. She exhibits normal muscle tone.  Skin: Skin is warm and dry. She is not diaphoretic. No pallor.  Psychiatric: She has a normal mood and affect. Her behavior is normal. Judgment and thought content normal.      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[place])    Managed by vascular      Relevant Medications   rosuvastatin (CRESTOR) 10 MG tablet     Nervous and Auditory   MS (multiple sclerosis) ([place])    Managed by neurologist and infusions at the cancer center      Relevant Medications   natalizumab (TYSABRI) 300  MG/15ML injection     Musculoskeletal and Integument   Osteoporosis    Request that DEXA scan results; she does not want to do the Fosamax; continue vitamin D, 1200 mg daily divided into 2-3 servings; offered referral; she'll hold for now      Closed fracture of shaft of right humerus    Healing okay, followed by ortho        Other   Pulmonary nodules    Seen by pulmonologist; due for f/u scan through pulmonologist 6 months after first      Mixed hyperlipidemia    Check lipids in 6 weeks after initiation of statin; low saturated fat diet  Relevant Medications   rosuvastatin (CRESTOR) 10 MG tablet   Medication monitoring encounter    Check liver function in 6 weeks      Relevant Orders   ALT       Follow up plan: Return in about 6 weeks (around 01/10/2018) for bloodwork only, Dr. Sanda Klein 6-1/2 weeks.  An after-visit summary was printed and given to the patient at [place].  Please see the patient instructions which may contain other information and recommendations beyond what is mentioned above in the assessment and plan.  Meds ordered this encounter  Medications  . rosuvastatin (CRESTOR) 10 MG tablet    Sig: Take 1 tablet (10 mg total) by mouth at bedtime.    Dispense:  30 tablet    Refill:  1    Orders Placed This Encounter  Procedures  . ALT  . Lipid panel

## 2017-11-29 NOTE — Assessment & Plan Note (Signed)
Healing okay, followed by ortho

## 2017-11-29 NOTE — Assessment & Plan Note (Signed)
Check liver function in 6 weeks

## 2017-11-29 NOTE — Assessment & Plan Note (Signed)
Check lipids in 6 weeks after initiation of statin; low saturated fat diet

## 2017-11-29 NOTE — Patient Instructions (Addendum)
Try to limit saturated fats in your diet (bologna, hot dogs, barbeque, cheeseburgers, hamburgers, steak, bacon, sausage, cheese, etc.) and get more fresh fruits, vegetables, and whole grains Start the Crestor (rosuvastatin) and then have labs rechecked 6 weeks later Request last set of labs from gynecologist Request DEXA scan results from provider  Cholesterol Cholesterol is a fat. Your body needs a small amount of cholesterol. Cholesterol (plaque) may build up in your blood vessels (arteries). That makes you more likely to have a heart attack or stroke. You cannot feel your cholesterol level. Having a blood test is the only way to find out if your level is high. Keep your test results. Work with your doctor to keep your cholesterol at a good level. What do the results mean?  Total cholesterol is how much cholesterol is in your blood.  LDL is bad cholesterol. This is the type that can build up. Try to have low LDL.  HDL is good cholesterol. It cleans your blood vessels and carries LDL away. Try to have high HDL.  Triglycerides are fat that the body can store or burn for energy. What are good levels of cholesterol?  Total cholesterol below 200.  LDL below 100 is good for people who have health risks. LDL below 70 is good for people who have very high risks.  HDL above 40 is good. It is best to have HDL of 60 or higher.  Triglycerides below 150. How can I lower my cholesterol? Diet Follow your diet program as told by your doctor.  Choose fish, white meat chicken, or [place] that is roasted or baked. Try not to eat red meat, fried foods, sausage, or lunch meats.  Eat lots of fresh fruits and vegetables.  Choose whole grains, beans, pasta, potatoes, and cereals.  Choose olive oil, corn oil, or canola oil. Only use small amounts.  Try not to eat butter, mayonnaise, shortening, or palm kernel oils.  Try not to eat foods with trans fats.  Choose low-fat or nonfat dairy  foods. ? Drink skim or nonfat milk. ? Eat low-fat or nonfat yogurt and cheeses. ? Try not to drink whole milk or cream. ? Try not to eat ice cream, egg yolks, or full-fat cheeses.  Healthy desserts include angel food cake, ginger snaps, animal crackers, hard candy, popsicles, and low-fat or nonfat frozen yogurt. Try not to eat pastries, cakes, pies, and cookies.  Exercise Follow your exercise program as told by your doctor.  Be more active. Try gardening, walking, and taking the stairs.  Ask your doctor about ways that you can be more active.  Medicine  Take over-the-counter and prescription medicines only as told by your doctor. This information is not intended to replace advice given to you by your health care provider. Make sure you discuss any questions you have with your health care provider. Document Released: 12/03/2008 Document Revised: 04/07/2016 Document Reviewed: 03/18/2016 Elsevier Interactive Patient Education  [employer].

## 2017-11-29 NOTE — Assessment & Plan Note (Signed)
Seen by pulmonologist; due for f/u scan through pulmonologist 6 months after first

## 2017-11-29 NOTE — Assessment & Plan Note (Signed)
Request that DEXA scan results; she does not want to do the Fosamax; continue vitamin D, 1200 mg daily divided into 2-3 servings; offered referral; she'll hold for now

## 2017-11-29 NOTE — Assessment & Plan Note (Signed)
Managed by neurologist and infusions at the cancer center

## 2017-11-30 DIAGNOSIS — G35 Multiple sclerosis: Secondary | ICD-10-CM | POA: Diagnosis not present

## 2017-11-30 DIAGNOSIS — R29898 Other symptoms and signs involving the musculoskeletal system: Secondary | ICD-10-CM | POA: Diagnosis not present

## 2017-11-30 DIAGNOSIS — S42301S Unspecified fracture of shaft of humerus, right arm, sequela: Secondary | ICD-10-CM | POA: Diagnosis not present

## 2017-11-30 DIAGNOSIS — G5631 Lesion of radial nerve, right upper limb: Secondary | ICD-10-CM | POA: Diagnosis not present

## 2017-11-30 DIAGNOSIS — S42301D Unspecified fracture of shaft of humerus, right arm, subsequent encounter for fracture with routine healing: Secondary | ICD-10-CM | POA: Diagnosis not present

## 2017-12-06 DIAGNOSIS — R29898 Other symptoms and signs involving the musculoskeletal system: Secondary | ICD-10-CM | POA: Diagnosis not present

## 2017-12-08 ENCOUNTER — Encounter: Payer: Self-pay | Admitting: Family Medicine

## 2017-12-08 ENCOUNTER — Telehealth: Payer: Self-pay | Admitting: Family Medicine

## 2017-12-08 NOTE — Telephone Encounter (Signed)
I received copy of her DEXA scan She has osteoporosis, which increases her risk of fracture Please encourage her to reconsider medicine or we can refer her to a specialist to consider infusions every six months (refer if she agrees) Encourage 3 servings of calcium a day, weight bearing exercise, and 1000 iu of vit D3 daily

## 2017-12-08 NOTE — Telephone Encounter (Signed)
Called pt no answer. Unable to leave message as voicemail is full. CRM created.

## 2017-12-09 ENCOUNTER — Telehealth: Payer: Self-pay | Admitting: Family Medicine

## 2017-12-09 NOTE — Telephone Encounter (Signed)
Informed pt of information below. Pt gave verbal understanding.

## 2017-12-09 NOTE — Telephone Encounter (Signed)
Yes, STOP the crestor, evaluation here or urgent care TODAY to make sure not gallbladder disease, pancreatitis, ulcer, etc.

## 2017-12-09 NOTE — Telephone Encounter (Signed)
Pt. Called to report the Crestor she has recently started is causing shortness of breath and upper abdominal pain. States it starts almost immediately after taking it and it last about 2 hours and stops. Please advise pt.

## 2017-12-09 NOTE — Telephone Encounter (Signed)
Called pt informed her of the need to be seen today for evaluation, pt DECLINES appt today with [place] and states that she does not feel this issue warrants an urgent care visit. Pt requests appt for next week. Added pt on with Benjamine Mola, NP for 12/14/17 8:20am.

## 2017-12-09 NOTE — Telephone Encounter (Signed)
Appt today with NP? Please advise. Thanks

## 2017-12-12 DIAGNOSIS — R29898 Other symptoms and signs involving the musculoskeletal system: Secondary | ICD-10-CM | POA: Diagnosis not present

## 2017-12-14 ENCOUNTER — Encounter: Payer: Self-pay | Admitting: Nurse Practitioner

## 2017-12-14 ENCOUNTER — Ambulatory Visit: Payer: 59 | Admitting: Nurse Practitioner

## 2017-12-14 VITALS — BP 120/72 | HR 80 | Temp 98.4°F | Resp 16 | Ht 67.0 in | Wt 160.1 lb

## 2017-12-14 DIAGNOSIS — M810 Age-related osteoporosis without current pathological fracture: Secondary | ICD-10-CM

## 2017-12-14 DIAGNOSIS — E782 Mixed hyperlipidemia: Secondary | ICD-10-CM | POA: Diagnosis not present

## 2017-12-14 DIAGNOSIS — Z79899 Other long term (current) drug therapy: Secondary | ICD-10-CM | POA: Diagnosis not present

## 2017-12-14 LAB — COMPLETE METABOLIC PANEL WITH GFR
AG RATIO: 1.5 (calc) (ref 1.0–2.5)
ALBUMIN MSPROF: 4.5 g/dL (ref 3.6–5.1)
ALKALINE PHOSPHATASE (APISO): 103 U/L (ref 33–130)
ALT: 22 U/L (ref 6–29)
AST: 18 U/L (ref 10–35)
BUN: 18 mg/dL (ref 7–25)
CALCIUM: 9.9 mg/dL (ref 8.6–10.4)
CO2: 26 mmol/L (ref 20–32)
Chloride: 110 mmol/L (ref 98–110)
Creat: 0.79 mg/dL (ref 0.50–1.05)
GFR, EST NON AFRICAN AMERICAN: 84 mL/min/{1.73_m2} (ref >=60)
GFR, Est African American: 98 mL/min/{1.73_m2} (ref >=60)
GLOBULIN: 3 g/dL (ref 1.9–3.7)
Glucose, Bld: 90 mg/dL (ref 65–99)
POTASSIUM: 3.8 mmol/L (ref 3.5–5.3)
SODIUM: 143 mmol/L (ref 135–146)
Total Bilirubin: 0.5 mg/dL (ref 0.2–1.2)
Total Protein: 7.5 g/dL (ref 6.1–8.1)

## 2017-12-14 MED ORDER — PRAVASTATIN SODIUM 80 MG PO TABS: 80.0000 mg | ORAL_TABLET | Freq: Every day | ORAL | 1 refills | Status: DC

## 2017-12-14 NOTE — Progress Notes (Addendum)
Name: Cynthia Nichols   MRN: [id number]    DOB: 12-19-1960   Date:12/14/2017       Progress Note  Subjective  Chief Complaint  Chief Complaint  Patient presents with  . Hyperlipidemia    crestor do not work for her, labs    HPI  Hyperlipidemia States was checked at [facility] and started on crestor which was on 11/29/2017. Patient took the crestor for 2 days and noted bilateral upper abdominal pain and shortness of breath. Symptoms resolved when she stopped medications. Sts shortness of breath occurred an hour after taking medication and she felt she had trouble catching her breath symptoms resolved after an hour- sts happened both days she took the crestor about an hour after. Sts took the crestor in the morning with food.  Denies n/v/d. Diet: morning- toast; next meal is at 3-4pm usually chicken and a vegetable, doesn't usually snack but occasionally take a poptart at night with her medicine. Drinks water Exercise: walks daily at least 20-30 minutes.    Osteoporosis  Bone Density Report from 12/06/2017 By Dr. Arvella Nigh showed osteopenia in Spine L1-L4 and Osteoprosis in bilateral femoral necks. Recommendations: begin taking fosamax, take 1000U of Vitamin D, begin moderate weight bearing exercise 3-4 times a week, and a follow-up DEXA scan in 2 years.  Declines fosamax.   Patient Active Problem List   Diagnosis Date Noted  . Aortic atherosclerosis ([place]) 11/29/2017  . Medication monitoring encounter 11/29/2017  . Osteoporosis 11/29/2017  . Essential hypertension, benign 11/29/2017  . Pulmonary nodules 10/06/2017  . 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 Humerus shaft fracture 05/17/2017  . Closed fracture of shaft of right humerus 05/13/2017  . Other fracture of shaft of right humerus, initial encounter for closed fracture 05/13/2017  . Contusion of elbow and forearm, initial encounter 08/26/2016  . SI (sacroiliac) joint dysfunction  10/23/2015  . Nonallopathic lesion of sacral region 10/23/2015  . Nonallopathic lesion of pelvic region 10/23/2015  . Nonallopathic lesion of lumbosacral region 10/23/2015  . Fracture of fifth metatarsal bone 08/13/2015  . Patella fracture 07/03/2015  . Palpitations 06/23/2012  . Mixed hyperlipidemia 06/23/2012  . MS (multiple sclerosis) ([place]) 06/23/2012  . Venous aneurysm   . IBS (irritable bowel syndrome)   . Unspecified venous (peripheral) insufficiency 11/09/2011  . 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 Leg pain 10/26/2011    Social History   Tobacco Use  . Smoking status: Never Smoker  . Smokeless tobacco: Never Used  Substance Use Topics  . Alcohol use: No    Alcohol/week: 0.0 oz     Current Outpatient Medications:  .  albuterol (PROVENTIL HFA) 108 (90 Base) MCG/ACT inhaler, Inhale into the lungs every 6 (six) hours as needed. , Disp: , Rfl:  .  bisoprolol (ZEBETA) 5 MG tablet, Take 2.5 mg by mouth daily., Disp: , Rfl:  .  budesonide-formoterol (SYMBICORT) 160-4.5 MCG/ACT inhaler, Inhale 2 puffs into the lungs 2 (two) times daily., Disp: , Rfl:  .  butalbital-acetaminophen-caffeine (FIORICET, ESGIC) 50-325-40 MG per tablet, Take 1 tablet by mouth 2 (two) times daily as needed for migraine. , Disp: , Rfl:  .  Cholecalciferol (VITAMIN D3 SUPER STRENGTH) 2000 units TABS, Take 4,000 Units by mouth., Disp: , Rfl:  .  gabapentin (NEURONTIN) 800 MG tablet, Take 1 tablet (800 mg total) by mouth at bedtime. (per neurologist), Disp: , Rfl:  .  lisinopril (PRINIVIL,ZESTRIL) 40 MG tablet, Take 40  mg by mouth at bedtime. , Disp: , Rfl:  .  natalizumab (TYSABRI) 300 MG/15ML injection, Inject into the vein., Disp: , Rfl:  .  zolpidem (AMBIEN) 5 MG tablet, Take 5 mg by mouth at bedtime. , Disp: , Rfl:  .  zonisamide (ZONEGRAN) 100 MG capsule, Take 200 mg by mouth at bedtime., Disp: , Rfl:   Allergies  Allergen Reactions  . Ampyra [Dalfampridine] Anaphylaxis and Shortness Of  Breath    Chest pain, also   . Imitrex [Sumatriptan] Other (See Comments)    Chest tightness  . Peanut-Containing Drug Products Itching    Itching in the mouth (remarked that she still eats this in limited amounts)  . Shrimp [Shellfish Allergy] Itching    Itching in the mouth (remarked that she still eats this in limited amounts)    ROS Constitutional: Negative for fever or weight change.  Respiratory: Negative for cough. Positive for shortness of breath-resolved.  Cardiovascular: Negative for chest pain or palpitations.  Gastrointestinal: Positive for abdominal pain- resolved, no bowel changes.  Musculoskeletal: Negative for gait problem or joint swelling.  Skin: Negative for rash.  Neurological: Negative for dizziness or headache.  No other specific complaints in a complete review of systems (except as listed in HPI above).  Objective  Vitals:   12/14/17 0838  BP: 120/72  Pulse: 80  Resp: 16  Temp: 98.4 F (36.9 C)  TempSrc: Oral  SpO2: 95%  Weight: 160 lb 1.6 oz (72.6 kg)  Height: 5\' 7"  (1.702 m)    Body mass index is 25.08 kg/m.  Nursing Note and Vital Signs reviewed.  Physical Exam  Constitutional: Patient appears well-developed and well-nourished. No distress.  Cardiovascular: Normal rate, regular rhythm, S1/S2 present.  No murmur or rub heard. No BLE edema. Pulmonary/Chest: Effort normal and breath sounds clear. No respiratory distress or retractions. Abdominal: Soft and non-tender, bowel sounds present x4 quadrants.  Psychiatric: Patient has a normal mood and affect. behavior is normal. Judgment and thought content normal.  No results found for this or any previous visit (from the past 72 hour(s)).  Assessment & Plan   1. Osteoporosis, unspecified osteoporosis type, unspecified pathological fracture presence - Continue walking - Continue calcium and Vitamin D supplements - Provided further information on fosamax, presently declines   2. Mixed  hyperlipidemia - Stop crestor - Continue healthy eating; try more fruits and vegetables - Follow up in 6 weeks for recheck of lipids - pravastatin (PRAVACHOL) 80 MG tablet; Take 1 tablet (80 mg total) by mouth daily.  Dispense: 90 tablet; Refill: 1  3. Medication management  - COMPLETE METABOLIC PANEL WITH GFR  -Red flags and when to present for emergency care or RTC including fever >101.89F, chest pain, shortness of breath, new/worsening/un-resolving symptoms,  reviewed with patient at time of visit. Follow up and care instructions discussed and provided in AVS.  I have reviewed this encounter including the documentation in this note and/or discussed this patient with the provider, Suezanne Cheshire DNP AGNP-C. I am certifying that I agree with the content of this note as supervising physician. Enid Derry, [facility] 12/14/2017, 4:43 PM

## 2017-12-14 NOTE — Patient Instructions (Addendum)
It was a pleasure meeting you today! I have sent a prescription of Pravachol to replace crestor due to your symptoms. We will draw a lab test today to look at your liver function and kidney function due to the medications you are on. You should here from [place] within a week about your labs. If you do not, call us! If you have any problems with this new medication do not hesitate to reach out. Below is some more information about the medication used as first-line treatment of osteoporosis.     Alendronate tablets What is this medicine? ALENDRONATE (a LEN droe nate) slows calcium loss from bones. It helps to make normal healthy bone and to slow bone loss in people with Paget's disease and osteoporosis. It may be used in others at risk for bone loss. This medicine may be used for other purposes; ask your health care provider or pharmacist if you have questions. COMMON BRAND NAME(S): Fosamax What should I tell my health care provider before I take this medicine? They need to know if you have any of these conditions: -dental disease -esophagus, stomach, or intestine problems, like acid reflux or GERD -kidney disease -low blood calcium -low vitamin D -problems sitting or standing 30 minutes -trouble swallowing -an unusual or allergic reaction to alendronate, other medicines, foods, dyes, or preservatives -pregnant or trying to get pregnant -breast-feeding How should I use this medicine? You must take this medicine exactly as directed or you will lower the amount of the medicine you absorb into your body or you may cause yourself harm. Take this medicine by mouth first thing in the morning, after you are up for the day. Do not eat or drink anything before you take your medicine. Swallow the tablet with a full glass (6 to 8 fluid ounces) of plain water. Do not take this medicine with any other drink. Do not chew or crush the tablet. After taking this medicine, do not eat breakfast, drink, or take any  medicines or vitamins for at least 30 minutes. Sit or stand up for at least 30 minutes after you take this medicine; do not lie down. Do not take your medicine more often than directed. Talk to your pediatrician regarding the use of this medicine in children. Special care may be needed. Overdosage: If you think you have taken too much of this medicine contact a poison control center or emergency room at once. NOTE: This medicine is only for you. Do not share this medicine with others. What if I miss a dose? If you miss a dose, do not take it later in the day. Continue your normal schedule starting the next morning. Do not take double or extra doses. What may interact with this medicine? -aluminum hydroxide -antacids -aspirin -calcium supplements -drugs for inflammation like ibuprofen, naproxen, and others -iron supplements -magnesium supplements -vitamins with minerals This list may not describe all possible interactions. Give your health care provider a list of all the medicines, herbs, non-prescription drugs, or dietary supplements you use. Also tell them if you smoke, drink alcohol, or use illegal drugs. Some items may interact with your medicine. What should I watch for while using this medicine? Visit your doctor or health care professional for regular checks ups. It may be some time before you see benefit from this medicine. Do not stop taking your medicine except on your doctor's advice. Your doctor or health care professional may order blood tests and other tests to see how you are doing. You should  make sure you get enough calcium and vitamin D while you are taking this medicine, unless your doctor tells you not to. Discuss the foods you eat and the vitamins you take with your health care professional. Some people who take this medicine have severe bone, joint, and/or muscle pain. This medicine may also increase your risk for a broken thigh bone. Tell your doctor right away if you have  pain in your upper leg or groin. Tell your doctor if you have any pain that does not go away or that gets worse. This medicine can make you more sensitive to the sun. If you get a rash while taking this medicine, sunlight may cause the rash to get worse. Keep out of the sun. If you cannot avoid being in the sun, wear protective clothing and use sunscreen. Do not use sun lamps or tanning beds/booths. What side effects may I notice from receiving this medicine? Side effects that you should report to your doctor or health care professional as soon as possible: -allergic reactions like skin rash, itching or hives, swelling of the face, lips, or tongue -black or tarry stools -bone, muscle or joint pain -changes in vision -chest pain -heartburn or stomach pain -jaw pain, especially after dental work -pain or trouble when swallowing -redness, blistering, peeling or loosening of the skin, including inside the mouth Side effects that usually do not require medical attention (report to your doctor or health care professional if they continue or are bothersome): -changes in taste -diarrhea or constipation -eye pain or itching -headache -nausea or vomiting -stomach gas or fullness This list may not describe all possible side effects. Call your doctor for medical advice about side effects. You may report side effects to FDA at 1-800-FDA-1088. Where should I keep my medicine? Keep out of the reach of children. Store at room temperature of 15 and 30 degrees C (59 and 86 degrees F). Throw away any unused medicine after the expiration date. NOTE: This sheet is a summary. It may not cover all possible information. If you have questions about this medicine, talk to your doctor, pharmacist, or health care provider.  2018 Elsevier/Gold Standard (2011-03-05 08:56:09)

## 2017-12-16 ENCOUNTER — Inpatient Hospital Stay: Payer: 59

## 2017-12-19 DIAGNOSIS — B961 Klebsiella pneumoniae [K. pneumoniae] as the cause of diseases classified elsewhere: Secondary | ICD-10-CM | POA: Diagnosis not present

## 2017-12-19 DIAGNOSIS — R300 Dysuria: Secondary | ICD-10-CM | POA: Diagnosis not present

## 2017-12-19 DIAGNOSIS — N390 Urinary tract infection, site not specified: Secondary | ICD-10-CM | POA: Diagnosis not present

## 2017-12-22 DIAGNOSIS — R29898 Other symptoms and signs involving the musculoskeletal system: Secondary | ICD-10-CM | POA: Diagnosis not present

## 2017-12-29 DIAGNOSIS — G35 Multiple sclerosis: Secondary | ICD-10-CM | POA: Diagnosis not present

## 2017-12-29 DIAGNOSIS — N319 Neuromuscular dysfunction of bladder, unspecified: Secondary | ICD-10-CM | POA: Diagnosis not present

## 2018-01-03 DIAGNOSIS — R29898 Other symptoms and signs involving the musculoskeletal system: Secondary | ICD-10-CM | POA: Diagnosis not present

## 2018-01-05 DIAGNOSIS — B961 Klebsiella pneumoniae [K. pneumoniae] as the cause of diseases classified elsewhere: Secondary | ICD-10-CM | POA: Diagnosis not present

## 2018-01-05 DIAGNOSIS — N390 Urinary tract infection, site not specified: Secondary | ICD-10-CM | POA: Diagnosis not present

## 2018-01-05 DIAGNOSIS — R300 Dysuria: Secondary | ICD-10-CM | POA: Diagnosis not present

## 2018-01-10 DIAGNOSIS — W19XXXS Unspecified fall, sequela: Secondary | ICD-10-CM | POA: Diagnosis not present

## 2018-01-10 DIAGNOSIS — M545 Low back pain: Secondary | ICD-10-CM | POA: Diagnosis not present

## 2018-01-10 DIAGNOSIS — G35 Multiple sclerosis: Secondary | ICD-10-CM | POA: Diagnosis not present

## 2018-01-10 DIAGNOSIS — R29898 Other symptoms and signs involving the musculoskeletal system: Secondary | ICD-10-CM | POA: Diagnosis not present

## 2018-01-10 DIAGNOSIS — R260 Ataxic gait: Secondary | ICD-10-CM | POA: Diagnosis not present

## 2018-01-11 DIAGNOSIS — R269 Unspecified abnormalities of gait and mobility: Secondary | ICD-10-CM | POA: Diagnosis not present

## 2018-01-11 DIAGNOSIS — G43119 Migraine with aura, intractable, without status migrainosus: Secondary | ICD-10-CM | POA: Diagnosis not present

## 2018-01-11 DIAGNOSIS — R202 Paresthesia of skin: Secondary | ICD-10-CM | POA: Diagnosis not present

## 2018-01-11 DIAGNOSIS — G43011 Migraine without aura, intractable, with status migrainosus: Secondary | ICD-10-CM | POA: Diagnosis not present

## 2018-01-11 DIAGNOSIS — G35 Multiple sclerosis: Secondary | ICD-10-CM | POA: Diagnosis not present

## 2018-01-13 ENCOUNTER — Inpatient Hospital Stay: Payer: 59

## 2018-01-17 DIAGNOSIS — R29898 Other symptoms and signs involving the musculoskeletal system: Secondary | ICD-10-CM | POA: Diagnosis not present

## 2018-01-17 DIAGNOSIS — N3020 Other chronic cystitis without hematuria: Secondary | ICD-10-CM | POA: Diagnosis not present

## 2018-01-18 DIAGNOSIS — M545 Low back pain: Secondary | ICD-10-CM | POA: Diagnosis not present

## 2018-01-18 DIAGNOSIS — R260 Ataxic gait: Secondary | ICD-10-CM | POA: Diagnosis not present

## 2018-01-18 DIAGNOSIS — W19XXXS Unspecified fall, sequela: Secondary | ICD-10-CM | POA: Diagnosis not present

## 2018-01-18 DIAGNOSIS — G35 Multiple sclerosis: Secondary | ICD-10-CM | POA: Diagnosis not present

## 2018-01-23 ENCOUNTER — Ambulatory Visit: Payer: 59 | Admitting: Family Medicine

## 2018-01-23 DIAGNOSIS — Z86010 Personal history of colonic polyps: Secondary | ICD-10-CM | POA: Diagnosis not present

## 2018-01-23 DIAGNOSIS — K582 Mixed irritable bowel syndrome: Secondary | ICD-10-CM | POA: Diagnosis not present

## 2018-01-23 DIAGNOSIS — K219 Gastro-esophageal reflux disease without esophagitis: Secondary | ICD-10-CM | POA: Diagnosis not present

## 2018-01-25 DIAGNOSIS — G35 Multiple sclerosis: Secondary | ICD-10-CM | POA: Diagnosis not present

## 2018-01-25 DIAGNOSIS — R260 Ataxic gait: Secondary | ICD-10-CM | POA: Diagnosis not present

## 2018-01-25 DIAGNOSIS — W19XXXS Unspecified fall, sequela: Secondary | ICD-10-CM | POA: Diagnosis not present

## 2018-01-25 DIAGNOSIS — M545 Low back pain: Secondary | ICD-10-CM | POA: Diagnosis not present

## 2018-01-31 DIAGNOSIS — K635 Polyp of colon: Secondary | ICD-10-CM | POA: Diagnosis not present

## 2018-01-31 DIAGNOSIS — Z86010 Personal history of colonic polyps: Secondary | ICD-10-CM | POA: Diagnosis not present

## 2018-01-31 DIAGNOSIS — D126 Benign neoplasm of colon, unspecified: Secondary | ICD-10-CM | POA: Diagnosis not present

## 2018-01-31 LAB — HM COLONOSCOPY

## 2018-02-02 DIAGNOSIS — G35 Multiple sclerosis: Secondary | ICD-10-CM | POA: Diagnosis not present

## 2018-02-02 DIAGNOSIS — R29898 Other symptoms and signs involving the musculoskeletal system: Secondary | ICD-10-CM | POA: Diagnosis not present

## 2018-02-02 DIAGNOSIS — W19XXXS Unspecified fall, sequela: Secondary | ICD-10-CM | POA: Diagnosis not present

## 2018-02-02 DIAGNOSIS — M545 Low back pain: Secondary | ICD-10-CM | POA: Diagnosis not present

## 2018-02-02 DIAGNOSIS — R260 Ataxic gait: Secondary | ICD-10-CM | POA: Diagnosis not present

## 2018-02-05 ENCOUNTER — Ambulatory Visit (INDEPENDENT_AMBULATORY_CARE_PROVIDER_SITE_OTHER): Payer: Self-pay | Admitting: Family Medicine

## 2018-02-05 VITALS — BP 105/65 | HR 88 | Temp 97.9°F | Resp 16 | Wt 157.6 lb

## 2018-02-05 DIAGNOSIS — W57XXXA Bitten or stung by nonvenomous insect and other nonvenomous arthropods, initial encounter: Secondary | ICD-10-CM

## 2018-02-05 DIAGNOSIS — R5383 Other fatigue: Secondary | ICD-10-CM

## 2018-02-05 DIAGNOSIS — Z8669 Personal history of other diseases of the nervous system and sense organs: Secondary | ICD-10-CM

## 2018-02-05 DIAGNOSIS — R5381 Other malaise: Secondary | ICD-10-CM

## 2018-02-05 DIAGNOSIS — G35 Multiple sclerosis: Secondary | ICD-10-CM

## 2018-02-05 MED ORDER — DOXYCYCLINE HYCLATE 100 MG PO TABS: 100.0000 mg | ORAL_TABLET | Freq: Two times a day (BID) | ORAL | 0 refills | Status: DC

## 2018-02-05 NOTE — Patient Instructions (Addendum)
PLAN< Presumptive treatment for RMSF initiated Advised to f/u with PCP for definitive eval and testing  [facility] Fever [facility] spotted fever is an illness that is spread to people by infected ticks. The illness causes flulike symptoms and a reddish-purple rash. This illness can quickly become very serious. Treatment must be started right away. When the illness is not treated right away, it can sometimes lead to long-term health problems or even death. This illness is most common during warm weather when ticks are most active. What are the causes? [facility] spotted fever is caused by a type of bacteria that is called Rickettsia rickettsii. This type of bacteria is carried by [place] dog ticks and [employer]. People get infected through a bite from a tick that is infected with the bacteria. The bite is painless, and it frequently goes unnoticed. The bacteria can also infect a person when tick blood or tick feces get into a person's body through damaged skin. A tick bite is not necessary for an infection to occur. People can get [facility] spotted fever if they get a tick's blood or body fluids on their skin in the area of a small cut or sore. This could happen while removing a tick from another person or a dog. The infection is not contagious, and it cannot be spread (transmitted) from person to person. What are the signs or symptoms? Symptoms may begin 2-14 days after a tick bite. The most common early symptoms are:  Fever.  Muscle aches.  Headache.  Nausea.  Vomiting.  Poor appetite.  Abdominal pain.  The reddish-purple rash usually appears 3-5 days after the first symptoms begin. The rash often starts on the wrists and ankles. It may then spread to the palms, the soles of the feet, the legs, and the trunk. How is this diagnosed? Diagnosis is based on a physical exam, medical history, and blood tests. Your health care provider may suspect  [facility] spotted fever in one of these cases:  If you have recently been bitten by a tick.  If you have been in areas that have a lot of ticks or in areas where the disease is common.  How is this treated? It is important to begin treatment right away. Treatment will usually involve the use of antibiotic medicines. In some cases, your health care provider may begin treatment before the diagnosis is confirmed. If your symptoms are severe, a hospital stay may be needed. Follow these instructions at home:  Rest as much as possible until you feel better.  Take medicines only as directed by your health care provider.  Take your antibiotic medicine as directed by your health care provider. Finish the antibiotic even if you start to feel better.  Drink enough fluid to keep your urine clear or pale yellow.  Keep all follow-up visits as directed by your health care provider. This is important. How is this prevented? Avoiding tick bites can help to prevent this illness. Take these steps to avoid tick bites when you are outdoors:  Be aware that most ticks live in shrubs, low tree branches, and grassy areas. A tick can climb onto your body when you make contact with leaves or grass where the tick is waiting.  Wear protective clothing. Long sleeves and long pants are best.  Wear white clothes so you can see ticks more easily.  Tuck your pant legs into your socks.  If you go walking on a trail, stay in the  middle of the trail to avoid brushing against bushes.  Avoid walking through areas that have long grass.  Put insect repellent on all exposed skin and along boot tops, pant legs, and sleeve cuffs.  Check clothing, hair, and skin repeatedly and before going inside.  Check family members and pets for ticks.  Brush off any ticks that are not attached.  Take a shower or a bath as soon as possible after you have been outdoors. Check your skin for ticks. The most common places on the  body where ticks attach themselves are the scalp, neck, armpits, waist, and groin.  You can also greatly reduce your chances of getting [facility] spotted fever if you remove attached ticks as soon as possible. To remove an attached tick, use a forceps or fine-point tweezers to detach the intact tick without leaving its mouth parts in the skin. The wound from the tick bite should be washed after the tick has been removed. Contact a health care provider if:  You have drainage, swelling, or increased redness or pain in the area of the rash. Get help right away if:  You have chest pain.  You have shortness of breath.  You have a severe headache.  You have a seizure.  You have severe abdominal pain.  You are feeling confused.  You are bruising easily.  You have bleeding from your gums.  You have blood in your stool. This information is not intended to replace advice given to you by your health care provider. Make sure you discuss any questions you have with your health care provider. Document Released: 12/19/2000 Document Revised: 02/12/2016 Document Reviewed: 04/22/2014 Elsevier Interactive Patient Education  2018 [employer].

## 2018-02-05 NOTE — Progress Notes (Signed)
Cynthia Nichols is a 57 y.o. female who presents today with concerns of fatigue, fever, and muscle aches after a tick bite patient reports about 3 weeks ago. [place] is not sure how long the tick was attached or if it was engorged. She does report removing it from the base of her neck. Of note she has comorbid condition of multiple sclerosis that is treated with medication Tecfidera.   Review of Systems  Constitutional: Positive for chills, fever and malaise/fatigue.  HENT: Negative for congestion, ear discharge, ear pain, sinus pain and sore throat.   Eyes: Negative.   Respiratory: Negative for cough, sputum production and shortness of breath.   Cardiovascular: Negative.  Negative for chest pain.  Gastrointestinal: Positive for nausea. Negative for abdominal pain, diarrhea and vomiting.  Genitourinary: Negative for dysuria, frequency, hematuria and urgency.  Musculoskeletal: Positive for myalgias.  Skin: Positive for rash.  Neurological: Negative for headaches.  Endo/Heme/Allergies: Negative.   Psychiatric/Behavioral: Negative.     O: Vitals:   02/05/18 1102  BP: 105/65  Pulse: 88  Resp: 16  Temp: 97.9 F (36.6 C)  SpO2: 98%     Physical Exam  Constitutional: She is oriented to person, place, and time. Vital signs are normal. She appears well-developed and well-nourished. She is active.  Non-toxic appearance. She does not have a sickly appearance.  HENT:  Head: Normocephalic.  Right Ear: Hearing, tympanic membrane, external ear and ear canal normal.  Left Ear: Hearing, tympanic membrane, external ear and ear canal normal.  Nose: Nose normal.  Mouth/Throat: Uvula is midline and oropharynx is clear and moist.  Neck: Normal range of motion. Neck supple.  Cardiovascular: Normal rate, regular rhythm, normal heart sounds and normal pulses.  Pulmonary/Chest: Effort normal and breath sounds normal.  Abdominal: Soft. Bowel sounds are normal.    Musculoskeletal: Normal range of motion.  Lymphadenopathy:       Head (right side): No submental and no submandibular adenopathy present.       Head (left side): No submental and no submandibular adenopathy present.    She has no cervical adenopathy.  Neurological: She is alert and oriented to person, place, and time.  Skin:     Sparsely distributed between maculopapular and petechia limited to chest area  Psychiatric: She has a normal mood and affect.  Vitals reviewed.    A: 1. Tick bite, initial encounter   2. Malaise and fatigue   3. History of multiple sclerosis      P: PLAN< Presumptive treatment for RMSF initiated Advised to f/u with PCP for definitive eval and testing  No ability to rule out RMSF so will treat presumtively and advised patient to f/u with PCP for evaluation in light of chronic comorbid condition and medication that can produce similar side effects. Treated  Exam findings, diagnosis etiology and medication use and indications reviewed with patient. Follow- Up and discharge instructions provided. No emergent/urgent issues found on exam.  Patient verbalized understanding of information provided and agrees with plan of care (POC), all questions answered.  1. Tick bite, initial encounter - doxycycline (VIBRA-TABS) 100 MG tablet; Take 1 tablet (100 mg total) by mouth 2 (two) times daily. 2. Malaise and fatigue  3. History of multiple sclerosis

## 2018-02-06 DIAGNOSIS — S42301D Unspecified fracture of shaft of humerus, right arm, subsequent encounter for fracture with routine healing: Secondary | ICD-10-CM | POA: Diagnosis not present

## 2018-02-06 DIAGNOSIS — S42301S Unspecified fracture of shaft of humerus, right arm, sequela: Secondary | ICD-10-CM | POA: Diagnosis not present

## 2018-02-06 DIAGNOSIS — G35 Multiple sclerosis: Secondary | ICD-10-CM | POA: Diagnosis not present

## 2018-02-06 DIAGNOSIS — G5631 Lesion of radial nerve, right upper limb: Secondary | ICD-10-CM | POA: Diagnosis not present

## 2018-02-06 DIAGNOSIS — R29898 Other symptoms and signs involving the musculoskeletal system: Secondary | ICD-10-CM | POA: Diagnosis not present

## 2018-02-06 DIAGNOSIS — R531 Weakness: Secondary | ICD-10-CM | POA: Diagnosis not present

## 2018-02-08 ENCOUNTER — Telehealth: Payer: Self-pay

## 2018-02-08 NOTE — Telephone Encounter (Signed)
I left a message to the patient asking to call us back. 

## 2018-02-09 ENCOUNTER — Encounter: Payer: Self-pay | Admitting: Nurse Practitioner

## 2018-02-09 ENCOUNTER — Ambulatory Visit: Payer: 59 | Admitting: Nurse Practitioner

## 2018-02-09 VITALS — BP 126/82 | HR 97 | Temp 98.2°F | Resp 16 | Ht 67.0 in | Wt 158.7 lb

## 2018-02-09 DIAGNOSIS — R21 Rash and other nonspecific skin eruption: Secondary | ICD-10-CM

## 2018-02-09 DIAGNOSIS — R1033 Periumbilical pain: Secondary | ICD-10-CM | POA: Diagnosis not present

## 2018-02-09 DIAGNOSIS — G43009 Migraine without aura, not intractable, without status migrainosus: Secondary | ICD-10-CM | POA: Insufficient documentation

## 2018-02-09 DIAGNOSIS — R509 Fever, unspecified: Secondary | ICD-10-CM

## 2018-02-09 DIAGNOSIS — J029 Acute pharyngitis, unspecified: Secondary | ICD-10-CM

## 2018-02-09 DIAGNOSIS — R05 Cough: Secondary | ICD-10-CM

## 2018-02-09 DIAGNOSIS — G4459 Other complicated headache syndrome: Secondary | ICD-10-CM | POA: Diagnosis not present

## 2018-02-09 DIAGNOSIS — R059 Cough, unspecified: Secondary | ICD-10-CM

## 2018-02-09 HISTORY — DX: Migraine without aura, not intractable, without status migrainosus: G43.009

## 2018-02-09 MED ORDER — BENZONATATE 100 MG PO CAPS: 100.0000 mg | ORAL_CAPSULE | Freq: Two times a day (BID) | ORAL | 0 refills | Status: DC | PRN

## 2018-02-09 NOTE — Patient Instructions (Addendum)
- sending to infectious disease - Monitoring blood counts, electrolytes, kidney, liver and checking for multiple tick-borne diseases in blood work - If having shortness of breath, chest pain, vomiting, getting too sleepy to stay awake, or confused seek emergency medical attention - take daily antihistamine - drink plenty of water to keep your urine clear - Eat nutritious foods and get plenty of rest.  - sent cough medicine if needed to the pharmacy     Tick Bite Information, Adult Ticks are insects that can bite. Most ticks live in shrubs and grassy areas. They climb onto people and animals that go by. Then they bite. Some ticks carry germs that can make you sick. How can I prevent tick bites?  Use an insect repellent that has 20% or higher of the ingredients DEET, picaridin, or IR3535. Put this insect repellent on: ? Bare skin. ? The tops of your boots. ? Your pant legs. ? The ends of your sleeves.  If you use an insect repellent that has the ingredient permethrin, make sure to follow the instructions on the bottle. Treat the following: ? Clothing. ? Supplies. ? Boots. ? Tents.  Wear long sleeves, long pants, and light colors.  Tuck your pant legs into your socks.  Stay in the middle of the trail.  Try not to walk through long grass.  Before going inside your house, check your clothes, hair, and skin for ticks. Make sure to check your head, neck, armpits, waist, groin, and joint areas.  Check for ticks every day.  When you come indoors: ? Wash your clothes right away. ? Shower right away. ? Dry your clothes in a dryer on high heat for 60 minutes or more. What is the right way to remove a tick? Remove a tick from your skin as soon as possible.  To remove a tick that is crawling on your skin: ? Go outdoors and brush the tick off. ? Use tape or a lint roller.  To remove a tick that is biting: ? Wash your hands. ? If you have latex gloves, put them on. ? Use tweezers,  curved forceps, or a tick-removal tool to grasp the tick. Grasp the tick as close to your skin and as close to the tick's head as possible. ? Gently pull up until the tick lets go.  Try to keep the tick's head attached to its body.  Do not twist or jerk the tick.  Do not squeeze or crush the tick.  Do not try to remove a tick with heat, alcohol, petroleum jelly, or fingernail polish. How should I get rid of a tick? Here are some ways to get rid of a tick that is alive:  Place the tick in rubbing alcohol.  Place the tick in a bag or container you can close tightly.  Wrap the tick tightly in tape.  Flush the tick down the toilet.  Contact a doctor if:  You have symptoms of a disease, such as: ? Pain in a muscle, joint, or bone. ? Trouble walking or moving your legs. ? Numbness in your legs. ? Inability to move (paralysis). ? A red rash that makes a circle (bull's-eye rash). ? Redness and swelling where the tick bit you. ? A fever. ? Throwing up (vomiting) over and over. ? Diarrhea. ? Weight loss. ? Tender and swollen lymph glands. ? Shortness of breath. ? Cough. ? Belly pain (abdominal pain). ? Headache. ? Being more tired than normal. ? A change in how  alert (conscious) you are. ? Confusion. Get help right away if:  You cannot remove a tick.  A part of a tick breaks off and gets stuck in your skin.  You are feeling worse. Summary  Ticks may carry germs that can make you sick.  To prevent tick bites, wear long sleeves, long pants, and light colors. Use insect repellent. Follow the instructions on the bottle.  If the tick is biting, do not try to remove it with heat, alcohol, petroleum jelly, or fingernail polish.  Use tweezers, curved forceps, or a tick-removal tool to grasp the tick. Gently pull up until the tick lets go. Do not twist or jerk the tick. Do not squeeze or crush the tick.  If you have symptoms, contact a doctor. This information is not intended  to replace advice given to you by your health care provider. Make sure you discuss any questions you have with your health care provider. Document Released: 12/01/2009 Document Revised: 12/17/2016 Document Reviewed: 12/17/2016 Elsevier Interactive Patient Education  2018 [employer].

## 2018-02-09 NOTE — Progress Notes (Addendum)
Name: Cynthia Nichols   MRN: [id number]    DOB: 10-16-1960   Date:02/09/2018       Progress Note  Subjective  Chief Complaint  Chief Complaint  Patient presents with  . Rash    all over chest  . Abdominal Pain    fever 100.4  . Headache    for 1 week    HPI  Patient notices rash last Monday on her chest has worsened, mild itching and starting to spread up chest more, and then started having abdominal pain and fatigue starting last Thursday. Low-grade fever, headaches. Started getting a sore throat and cough yesterday. Patient noticed tick on her in April and one yesterday was concerned for rocky mountain spotted fever- started on doxycycline- prescribed at [facility]- on Sunday but hasn't been feeling better like she had when she had RMSF in the past.  Patient took Fioricet this morning for headache- headache is much improved. Denies changes in MS treatment and states none of this feels like and MS flare up.  Abdominal pain is periumbilical noted states may be due to MS medication, endorses nausea no vomiting. Denies changes in bowel habits. States pain has been in the abdomen has been improving. States thought it was related to gas from colonoscopy- pain not present now but was intermittent lasting a few minutes at a time, not related to eating.      Patient Active Problem List   Diagnosis Date Noted  . Migraine without aura and without status migrainosus, not intractable 02/09/2018  . 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 Aortic atherosclerosis ([place]) 11/29/2017  . Medication monitoring encounter 11/29/2017  . Osteoporosis 11/29/2017  . Essential hypertension, benign 11/29/2017  . Pulmonary nodules 10/06/2017  . 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 Humerus shaft fracture 05/17/2017  . Closed fracture of shaft of right humerus 05/13/2017  . Other fracture of shaft of right humerus, initial encounter for closed fracture 05/13/2017   . Contusion of elbow and forearm, initial encounter 08/26/2016  . 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  . Nonallopathic lesion of sacral region 10/23/2015  . Nonallopathic lesion of pelvic region 10/23/2015  . Nonallopathic lesion of lumbosacral region 10/23/2015  . Fracture of fifth metatarsal bone 08/13/2015  . Patella fracture 07/03/2015  . Paresthesia 06/19/2015  . Palpitations 06/23/2012  . Mixed hyperlipidemia 06/23/2012  . Venous aneurysm   . IBS (irritable bowel syndrome)   . Multiple sclerosis, relapsing-remitting ([place]) 12/14/2011  . Unspecified venous (peripheral) insufficiency 11/09/2011  . 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 Leg pain 10/26/2011    Past Medical History:  Diagnosis Date  . Adopted    Patient has no known family history  . Asthma   . Headache(784.0)   . 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 Hypertension   . IBS (irritable bowel syndrome)   . MS (multiple sclerosis) ([place])   . PONV (postoperative nausea and vomiting)   . Venous aneurysm    Right side of neck    Past Surgical History:  Procedure Laterality Date  . ABDOMINAL HYSTERECTOMY    . BREAST MASS EXCISION     Bilateral breast mass excision ( Benign)  . ORIF HUMERUS FRACTURE Right 05/17/2017  . TENNIS ELBOW RELEASE/NIRSCHEL PROCEDURE Right 05/17/2017   Procedure: EXPLORATION AND EXPOSURE OF RADIAL NERVE RIGHT ARM;  Surgeon: Roseanne Kaufman, MD;  Location: [facility];  Service: Orthopedics;  Laterality: Right;  Social History   Tobacco Use  . Smoking status: Never Smoker  . Smokeless tobacco: Never Used  Substance Use Topics  . Alcohol use: No    Alcohol/week: 0.0 oz     Current Outpatient Medications:  .  amphetamine-dextroamphetamine (ADDERALL XR) 10 MG 24 hr capsule, Adderall XR 10 mg capsule,extended release, Disp: , Rfl:  .  bisoprolol (ZEBETA) 5 MG tablet, Take 2.5 mg by mouth daily., Disp: , Rfl:  .  butalbital-acetaminophen-caffeine (FIORICET,  ESGIC) 50-325-40 MG per tablet, Take 1 tablet by mouth 2 (two) times daily as needed for migraine. , Disp: , Rfl:  .  doxycycline (VIBRA-TABS) 100 MG tablet, Take 1 tablet (100 mg total) by mouth 2 (two) times daily., Disp: 20 tablet, Rfl: 0 .  DULoxetine (CYMBALTA) 30 MG capsule, duloxetine 30 mg capsule,delayed release, Disp: , Rfl:  .  gabapentin (NEURONTIN) 800 MG tablet, Take 1 tablet (800 mg total) by mouth at bedtime. (per neurologist), Disp: , Rfl:  .  lisinopril (PRINIVIL,ZESTRIL) 40 MG tablet, Take 40 mg by mouth at bedtime. , Disp: , Rfl:  .  naratriptan (AMERGE) 2.5 MG tablet, naratriptan 2.5 mg tablet, Disp: , Rfl:  .  pravastatin (PRAVACHOL) 80 MG tablet, Take 1 tablet (80 mg total) by mouth daily., Disp: 90 tablet, Rfl: 1 .  albuterol (PROVENTIL HFA) 108 (90 Base) MCG/ACT inhaler, Inhale into the lungs every 6 (six) hours as needed. , Disp: , Rfl:  .  benzonatate (TESSALON) 100 MG capsule, Take 1 capsule (100 mg total) by mouth 2 (two) times daily as needed for cough., Disp: 20 capsule, Rfl: 0 .  budesonide-formoterol (SYMBICORT) 160-4.5 MCG/ACT inhaler, Inhale 2 puffs into the lungs 2 (two) times daily., Disp: , Rfl:  .  Calcium-Magnesium-Vitamin D (CALCIUM 1200+D3 PO), Take 1 tablet by mouth 2 (two) times daily., Disp: , Rfl:  .  cephALEXin (KEFLEX) 250 MG capsule, cephalexin 250 mg capsule  TAKE 1 CAPSULE BY MOUTH ONCE DAILY AT BEDTIME, Disp: , Rfl:  .  Cholecalciferol (VITAMIN D3 SUPER STRENGTH) 2000 units TABS, Take 4,000 Units by mouth., Disp: , Rfl:  .  zolpidem (AMBIEN) 5 MG tablet, Take 5 mg by mouth at bedtime. , Disp: , Rfl:  .  zonisamide (ZONEGRAN) 100 MG capsule, Take 200 mg by mouth at bedtime., Disp: , Rfl:   Allergies  Allergen Reactions  . Ampyra [Dalfampridine] Anaphylaxis and Shortness Of Breath    Chest pain, also   . Imitrex [Sumatriptan] Other (See Comments)    Chest tightness  . Crestor [Rosuvastatin Calcium] Other (See Comments)    Abdominal pain,  shortness of breath  . Peanut-Containing Drug Products Itching    Itching in the mouth (remarked that she still eats this in limited amounts)  . Shrimp [Shellfish Allergy] Itching    Itching in the mouth (remarked that she still eats this in limited amounts)    ROS  Constitutional: Positive for fever or Negative weight change.  Respiratory: Positive for cough and Negative shortness of breath.   Cardiovascular: Negative for chest pain or palpitations.  Gastrointestinal: Positive for abdominal pain, no bowel changes.  Musculoskeletal: Positive for gait problem- chronic MS related or joint swelling.  Skin: Positive for rash.  Neurological: Negative for dizziness Positive  headache.  No other specific complaints in a complete review of systems (except as listed in HPI above).  Objective  Vitals:   02/09/18 0831  BP: 126/82  Pulse: 97  Resp: 16  Temp: 98.2 F (36.8 C)  TempSrc: Oral  SpO2: 96%  Weight: 158 lb 11.2 oz (72 kg)  Height: 5\' 7"  (1.702 m)    Body mass index is 24.86 kg/m.  Nursing Note and Vital Signs reviewed.  Physical Exam  Skin:       Constitutional: Patient appears well-developed and well-nourished.  No distress.  HEENT: head atraumatic, normocephalic, pupils equal and reactive to light, TM's without erythema or bulging, mild bilateral maxillary tenderness no frontal sinus tenderness , neck supple without lymphadenopathy, oropharynx red, tonsils 2+ no exudate and moist, no nasal discharge. No lymphadenopathy  Cardiovascular: Normal rate, regular rhythm, S1/S2 present.  No murmur or rub heard.  Pulmonary/Chest: Effort normal and breath sounds clear. No respiratory distress or retractions. Abdominal: Soft and non-tender, bowel sounds present, no rash on abdomen, no masses palpated Neurological: alert and oriented x3, no neck stiffness, PERRLA  Psychiatric: Patient has a normal mood and affect. behavior is normal. Judgment and thought content normal.  No  results found for this or any previous visit (from the past 72 hour(s)).  Assessment & Plan  1. Rash - antihistamine - CBC with Differential - Comprehensive Metabolic Panel (CMET) - Rocky mtn spotted fvr abs pnl(IgG+IgM) - B. Burgdorfi Antibodies by WB - Babesia microti Antibodies IgG, IgM - Ehrlichia Antibody Panel - Ambulatory referral to Infectious Disease  2. Low grade fever -tylenol PRN  - CBC with Differential - Comprehensive Metabolic Panel (CMET) - Rocky mtn spotted fvr abs pnl(IgG+IgM) - B. Burgdorfi Antibodies by WB - Babesia microti Antibodies IgG, IgM - Ehrlichia Antibody Panel - Ambulatory referral to Infectious Disease  3. Periumbilical abdominal pain - improving - CBC with Differential - Comprehensive Metabolic Panel (CMET) - Rocky mtn spotted fvr abs pnl(IgG+IgM) - B. Burgdorfi Antibodies by WB - Babesia microti Antibodies IgG, IgM - Ehrlichia Antibody Panel - Ambulatory referral to Infectious Disease  4. Other complicated headache syndrome - Tylenol and Fioricet if needed - CBC with Differential - Comprehensive Metabolic Panel (CMET) - Rocky mtn spotted fvr abs pnl(IgG+IgM) - B. Burgdorfi Antibodies by WB - Babesia microti Antibodies IgG, IgM - Ehrlichia Antibody Panel - Ambulatory referral to Infectious Disease  5. Cough -OTC meds - benzonatate (TESSALON) 100 MG capsule; Take 1 capsule (100 mg total) by mouth 2 (two) times daily as needed for cough.  Dispense: 20 capsule; Refill: 0  6. Sore throat Centor criteria- 0 -OTC management   Face-to-face time with patient was more than 25 minutes, >50% time spent counseling and coordination of care -Red flags and when to present for emergency care or RTC including fever >101.60F, chest pain, shortness of breath, new/worsening/un-resolving symptoms, reviewed with patient at time of visit. Follow up and care instructions discussed and provided in AVS. -Reviewed Health Maintenance: get records    ---------------------------------------- I have reviewed this encounter including the documentation in this note and/or discussed this patient with the provider, Suezanne Cheshire DNP AGNP-C. I am certifying that I agree with the content of this note as supervising physician. Enid Derry, [facility] 02/10/2018, 2:03 PM

## 2018-02-10 ENCOUNTER — Telehealth: Payer: Self-pay

## 2018-02-10 ENCOUNTER — Inpatient Hospital Stay: Payer: 59

## 2018-02-10 NOTE — Telephone Encounter (Signed)
Copied from [place] [id number]. Topic: Referral - Question >> Feb 10, 2018 10:35 AM Lennox Solders wrote: Reason for CRM: connie is calling dr fitzgerald stop accepting new patient the end of April. Dr fitzgerald will be leaving on 03-10-18  Referral will be sent internally to [facility] Infectious Disease  [address], [place] [address] Phone: [phone] Fax: [phone]

## 2018-02-15 LAB — CBC WITH DIFFERENTIAL/PLATELET
Basophils Absolute: 93 cells/uL (ref 0–200)
Basophils Relative: 1.5 %
Eosinophils Absolute: 192 cells/uL (ref 15–500)
Eosinophils Relative: 3.1 %
HEMATOCRIT: 35.1 % (ref 35.0–45.0)
HEMOGLOBIN: 12 g/dL (ref 11.7–15.5)
LYMPHS ABS: 2294 {cells}/uL (ref 850–3900)
MCH: 30 pg (ref 27.0–33.0)
MCHC: 34.2 g/dL (ref 32.0–36.0)
MCV: 87.8 fL (ref 80.0–100.0)
MPV: 11.7 fL (ref 7.5–12.5)
Monocytes Relative: 7.3 %
NEUTROS ABS: 3168 {cells}/uL (ref 1500–7800)
Neutrophils Relative %: 51.1 %
Platelets: 284 10*3/uL (ref 140–400)
RBC: 4 10*6/uL (ref 3.80–5.10)
RDW: 12.5 % (ref 11.0–15.0)
Total Lymphocyte: 37 %
WBC mixed population: 453 cells/uL (ref 200–950)
WBC: 6.2 10*3/uL (ref 3.8–10.8)

## 2018-02-15 LAB — COMPREHENSIVE METABOLIC PANEL
AG RATIO: 1.4 (calc) (ref 1.0–2.5)
ALBUMIN MSPROF: 4.2 g/dL (ref 3.6–5.1)
ALT: 39 U/L — ABNORMAL HIGH (ref 6–29)
AST: 30 U/L (ref 10–35)
Alkaline phosphatase (APISO): 117 U/L (ref 33–130)
BILIRUBIN TOTAL: 0.5 mg/dL (ref 0.2–1.2)
BUN: 11 mg/dL (ref 7–25)
CALCIUM: 9.6 mg/dL (ref 8.6–10.4)
CO2: 26 mmol/L (ref 20–32)
Chloride: 108 mmol/L (ref 98–110)
Creat: 0.59 mg/dL (ref 0.50–1.05)
Globulin: 2.9 g/dL (calc) (ref 1.9–3.7)
Glucose, Bld: 90 mg/dL (ref 65–99)
POTASSIUM: 3.4 mmol/L — AB (ref 3.5–5.3)
SODIUM: 145 mmol/L (ref 135–146)
TOTAL PROTEIN: 7.1 g/dL (ref 6.1–8.1)

## 2018-02-15 LAB — BABESIA MICROTI ANTIBODIES IGG, IGM: Babesia microti IgM: <1:20 {titer}

## 2018-02-15 LAB — B. BURGDORFI ANTIBODIES BY WB
B BURGDORFERI IGG ABS (IB): NEGATIVE
B BURGDORFERI IGM ABS (IB): POSITIVE — AB
LYME DISEASE 18 KD IGG: NONREACTIVE
LYME DISEASE 30 KD IGG: NONREACTIVE
LYME DISEASE 39 KD IGG: NONREACTIVE
LYME DISEASE 58 KD IGG: NONREACTIVE
LYME DISEASE 93 KD IGG: NONREACTIVE
Lyme Disease 23 kD IgG: NONREACTIVE
Lyme Disease 23 kD IgM: NONREACTIVE
Lyme Disease 28 kD IgG: NONREACTIVE
Lyme Disease 39 kD IgM: REACTIVE — AB
Lyme Disease 41 kD IgG: NONREACTIVE
Lyme Disease 41 kD IgM: REACTIVE — AB
Lyme Disease 45 kD IgG: NONREACTIVE
Lyme Disease 66 kD IgG: NONREACTIVE

## 2018-02-15 LAB — EHRLICHIA ANTIBODY PANEL

## 2018-02-15 LAB — ROCKY MTN SPOTTED FVR ABS PNL(IGG+IGM)
RMSF IGG: NOT DETECTED
RMSF IgM: NOT DETECTED

## 2018-02-17 DIAGNOSIS — W19XXXS Unspecified fall, sequela: Secondary | ICD-10-CM | POA: Diagnosis not present

## 2018-02-17 DIAGNOSIS — R29898 Other symptoms and signs involving the musculoskeletal system: Secondary | ICD-10-CM | POA: Diagnosis not present

## 2018-02-17 DIAGNOSIS — M545 Low back pain: Secondary | ICD-10-CM | POA: Diagnosis not present

## 2018-02-17 DIAGNOSIS — G35 Multiple sclerosis: Secondary | ICD-10-CM | POA: Diagnosis not present

## 2018-02-17 DIAGNOSIS — R260 Ataxic gait: Secondary | ICD-10-CM | POA: Diagnosis not present

## 2018-02-22 DIAGNOSIS — G35 Multiple sclerosis: Secondary | ICD-10-CM | POA: Diagnosis not present

## 2018-02-22 DIAGNOSIS — W19XXXS Unspecified fall, sequela: Secondary | ICD-10-CM | POA: Diagnosis not present

## 2018-02-22 DIAGNOSIS — R260 Ataxic gait: Secondary | ICD-10-CM | POA: Diagnosis not present

## 2018-02-22 DIAGNOSIS — M545 Low back pain: Secondary | ICD-10-CM | POA: Diagnosis not present

## 2018-02-24 ENCOUNTER — Ambulatory Visit: Payer: 59 | Admitting: Nurse Practitioner

## 2018-03-06 ENCOUNTER — Telehealth: Payer: Self-pay

## 2018-03-06 NOTE — Telephone Encounter (Signed)
Copied from [place] [id number]. Topic: Referral - Status >> Mar 06, 2018  9:00 AM Judyann Munson wrote: Reason for CRM: Marlowe Kays is calling  from dr fitzgerald he will not be accepting New patient. He will be traveling to [place].   Referral has already been switched from Dr. Ola Spurr. The referral was sent to the [facility] ID providers at the [facility] for Infectious Disease.  Please see below  Type Date User Summary Attachment  Specialty Comments 02/28/2018 3:44 PM Celedonio, Johaura - -  Note   Called and left message for patient to call back and set up appointment         Type Date User Summary Attachment  Specialty Comments 02/17/2018 2:46 PM Celedonio, Johaura - -  Note   Called and left message for patient to call back to set up appointment          CRM # [phone]  Owner: None  Status: Resolved  Priority: Routine Created on: 02/10/2018 10:35 AM By: Lennox Solders   Summary: no longer accept new pt please referral to [facility] etc  Reason for CRM: connie is calling dr fitzgerald stop accepting new patient the end of April. Dr fitzgerald will be leaving on 03-10-18    Referral will be sent internally to [facility] for Infectious Disease         Type Date User Summary Attachment  General 02/09/2018 11:58 AM [place], Kristeen Miss A, CMA - -  Note   The requested information has been sent to [facility] Infectious Disease P: [phone] F: [phone]  Release ID #[id number]

## 2018-03-10 ENCOUNTER — Inpatient Hospital Stay: Payer: 59

## 2018-03-20 DIAGNOSIS — M50222 Other cervical disc displacement at C5-C6 level: Secondary | ICD-10-CM | POA: Diagnosis not present

## 2018-03-20 DIAGNOSIS — M4802 Spinal stenosis, cervical region: Secondary | ICD-10-CM | POA: Diagnosis not present

## 2018-03-20 DIAGNOSIS — G35 Multiple sclerosis: Secondary | ICD-10-CM | POA: Diagnosis not present

## 2018-03-24 DIAGNOSIS — G35 Multiple sclerosis: Secondary | ICD-10-CM | POA: Diagnosis not present

## 2018-03-24 DIAGNOSIS — W19XXXS Unspecified fall, sequela: Secondary | ICD-10-CM | POA: Diagnosis not present

## 2018-03-24 DIAGNOSIS — M545 Low back pain: Secondary | ICD-10-CM | POA: Diagnosis not present

## 2018-03-24 DIAGNOSIS — R260 Ataxic gait: Secondary | ICD-10-CM | POA: Diagnosis not present

## 2018-03-28 ENCOUNTER — Telehealth: Payer: 59 | Admitting: Physician Assistant

## 2018-03-28 DIAGNOSIS — B9789 Other viral agents as the cause of diseases classified elsewhere: Secondary | ICD-10-CM | POA: Diagnosis not present

## 2018-03-28 DIAGNOSIS — J069 Acute upper respiratory infection, unspecified: Secondary | ICD-10-CM

## 2018-03-28 MED ORDER — BENZONATATE 100 MG PO CAPS: 100.0000 mg | ORAL_CAPSULE | Freq: Three times a day (TID) | ORAL | 0 refills | Status: DC

## 2018-03-28 NOTE — Progress Notes (Signed)
We are sorry you are not feeling well.  Here is how we plan to help!  Based on what you have shared with me, it looks like you may have a viral upper respiratory infection or a "common cold".  Colds are caused by a large number of viruses; however, rhinovirus is the most common cause.   Symptoms of the common cold vary from person to person, with common symptoms including sore throat, cough, and malaise.  A low-grade fever of 100.4 may present, but is often uncommon.  Symptoms vary however, and are closely related to a person's age or underlying illnesses.  The most common symptoms associated with the common cold are nasal discharge or congestion, cough, sneezing, headache and pressure in the ears and face.  Cold symptoms usually persist for about 3 to 10 days, but can last up to 2 weeks.  It is important to know that colds do not cause serious illness or complications in most cases.    The common cold is transmitted from person to person, with the most common method of transmission being a person's hands.  The virus is able to live on the skin and can infect other persons for up to 2 hours after direct contact.  Also, colds are transmitted when someone coughs or sneezes; thus, it is important to cover the mouth to reduce this risk.  To keep the spread of the common cold at [place], good hand hygiene is very important.  This is an infection that is most likely caused by a virus. There are no specific treatments for the common cold other than to help you with the symptoms until the infection runs its course.    For nasal congestion, you may use an oral decongestants such as Mucinex D or if you have glaucoma or high blood pressure use plain Mucinex.  Saline nasal spray or nasal drops can help and can safely be used as often as needed for congestion.   If you do not have a history of heart disease, hypertension, diabetes or thyroid disease, prostate/bladder issues or glaucoma, you may also use Sudafed to treat  nasal congestion.  It is highly recommended that you consult with a pharmacist or your primary care physician to ensure this medication is safe for you to take.     If you have a cough, you may use cough suppressants such as Delsym and Robitussin.  If you have glaucoma or high blood pressure, you can also use Coricidin HBP.   For cough I have prescribed for you A prescription cough medication called Tessalon Perles 100 mg. You may take 1-2 capsules every 8 hours as needed for cough  If you have a sore or scratchy throat, use a saltwater gargle-  to  teaspoon of salt dissolved in a 4-ounce to 8-ounce glass of warm water.  Gargle the solution for approximately 15-30 seconds and then spit.  It is important not to swallow the solution.  You can also use throat lozenges/cough drops and Chloraseptic spray to help with throat pain or discomfort.  Warm or cold liquids can also be helpful in relieving throat pain.  For headache, pain or general discomfort, you can use Ibuprofen or Tylenol as directed.   Some authorities believe that zinc sprays or the use of Echinacea may shorten the course of your symptoms.   HOME CARE . Only take medications as instructed by your medical team. . Be sure to drink plenty of fluids. Water is fine as well as fruit  juices, sodas and electrolyte beverages. You may want to stay away from caffeine or alcohol. If you are nauseated, try taking small sips of liquids. How do you know if you are getting enough fluid? Your urine should be a pale yellow or almost colorless. . Get rest. . Taking a steamy shower or using a humidifier may help nasal congestion and ease sore throat pain. You can place a towel over your head and breathe in the steam from hot water coming from a faucet. . Using a saline nasal spray works much the same way. . Cough drops, hard candies and sore throat lozenges may ease your cough. . Avoid close contacts especially the very young and the elderly . Cover your  mouth if you cough or sneeze . Always remember to wash your hands.   GET HELP RIGHT AWAY IF: . You develop worsening fever. . If your symptoms do not improve within 10 days . You become short of breath. . You develop yellow or green discharge from your nose over 3 days. . You have coughing fits . You develop a severe head ache or visual changes. . You develop shortness of breath or difficulty breathing. . Your symptoms persist after you have completed your treatment plan  MAKE SURE YOU   Understand these instructions.  Will watch your condition.  Will get help right away if you are not doing well or get worse.  Your e-visit answers were reviewed by a board certified advanced clinical practitioner to complete your personal care plan. Depending upon the condition, your plan could have included both over the counter or prescription medications. Please review your pharmacy choice. If there is a problem, you may call our nursing hot line at and have the prescription routed to another pharmacy. Your safety is important to [place]. If you have drug allergies check your prescription carefully.   You can use MyChart to ask questions about today's visit, request a non-urgent call back, or ask for a work or school excuse for 24 hours related to this e-Visit. If it has been greater than 24 hours you will need to follow up with your provider, or enter a new e-Visit to address those concerns. You will get an e-mail in the next two days asking about your experience.  I hope that your e-visit has been valuable and will speed your recovery. Thank you for using e-visits.

## 2018-03-29 ENCOUNTER — Ambulatory Visit (INDEPENDENT_AMBULATORY_CARE_PROVIDER_SITE_OTHER)
Admission: RE | Admit: 2018-03-29 | Discharge: 2018-03-29 | Disposition: A | Discharge status: Home / Self Care | Payer: 59 | Source: Ambulatory Visit | Attending: Pulmonary Disease | Admitting: Pulmonary Disease

## 2018-03-29 DIAGNOSIS — R911 Solitary pulmonary nodule: Secondary | ICD-10-CM

## 2018-03-29 DIAGNOSIS — R918 Other nonspecific abnormal finding of lung field: Secondary | ICD-10-CM | POA: Diagnosis not present

## 2018-03-30 DIAGNOSIS — R260 Ataxic gait: Secondary | ICD-10-CM | POA: Diagnosis not present

## 2018-03-30 DIAGNOSIS — G35 Multiple sclerosis: Secondary | ICD-10-CM | POA: Diagnosis not present

## 2018-03-30 DIAGNOSIS — W19XXXS Unspecified fall, sequela: Secondary | ICD-10-CM | POA: Diagnosis not present

## 2018-03-30 DIAGNOSIS — M545 Low back pain: Secondary | ICD-10-CM | POA: Diagnosis not present

## 2018-03-31 ENCOUNTER — Telehealth: Payer: Self-pay | Admitting: Pulmonary Disease

## 2018-03-31 NOTE — Progress Notes (Signed)
LMTCB

## 2018-03-31 NOTE — Telephone Encounter (Signed)
Notes recorded by Rigoberto Noel, MD on 03/30/2018 at 4:33 PM EDT Unchanged nodules , likely benign --------------------------- Spoke with pt. She is aware of results. Nothing further was needed.

## 2018-04-05 ENCOUNTER — Ambulatory Visit: Payer: Self-pay | Admitting: Adult Health

## 2018-04-07 ENCOUNTER — Ambulatory Visit: Payer: Self-pay

## 2018-05-05 ENCOUNTER — Other Ambulatory Visit (INDEPENDENT_AMBULATORY_CARE_PROVIDER_SITE_OTHER): Payer: Self-pay | Admitting: Orthopaedic Surgery

## 2018-05-05 ENCOUNTER — Ambulatory Visit: Payer: Self-pay

## 2018-05-05 ENCOUNTER — Ambulatory Visit: Payer: Self-pay | Admitting: Oncology

## 2018-05-09 ENCOUNTER — Other Ambulatory Visit (INDEPENDENT_AMBULATORY_CARE_PROVIDER_SITE_OTHER): Payer: Self-pay | Admitting: Orthopaedic Surgery

## 2018-05-09 NOTE — Telephone Encounter (Signed)
I called and spoke with pharmacy. Advised this is not a patient of Dr. Lorin Mercy and he does not prescribe Lisinopril.

## 2018-05-16 DIAGNOSIS — F5101 Primary insomnia: Secondary | ICD-10-CM | POA: Diagnosis not present

## 2018-05-16 DIAGNOSIS — G43009 Migraine without aura, not intractable, without status migrainosus: Secondary | ICD-10-CM | POA: Diagnosis not present

## 2018-05-16 DIAGNOSIS — G35 Multiple sclerosis: Secondary | ICD-10-CM | POA: Diagnosis not present

## 2018-05-16 DIAGNOSIS — R202 Paresthesia of skin: Secondary | ICD-10-CM | POA: Diagnosis not present

## 2018-05-24 DIAGNOSIS — N3020 Other chronic cystitis without hematuria: Secondary | ICD-10-CM | POA: Diagnosis not present

## 2018-06-28 DIAGNOSIS — N390 Urinary tract infection, site not specified: Secondary | ICD-10-CM | POA: Diagnosis not present

## 2018-06-28 DIAGNOSIS — N3020 Other chronic cystitis without hematuria: Secondary | ICD-10-CM | POA: Diagnosis not present

## 2018-06-28 DIAGNOSIS — R300 Dysuria: Secondary | ICD-10-CM | POA: Diagnosis not present

## 2018-06-28 DIAGNOSIS — B961 Klebsiella pneumoniae [K. pneumoniae] as the cause of diseases classified elsewhere: Secondary | ICD-10-CM | POA: Diagnosis not present

## 2018-07-19 DIAGNOSIS — L820 Inflamed seborrheic keratosis: Secondary | ICD-10-CM | POA: Diagnosis not present

## 2018-07-19 DIAGNOSIS — L570 Actinic keratosis: Secondary | ICD-10-CM | POA: Diagnosis not present

## 2018-07-19 DIAGNOSIS — L578 Other skin changes due to chronic exposure to nonionizing radiation: Secondary | ICD-10-CM | POA: Diagnosis not present

## 2018-07-19 DIAGNOSIS — D1801 Hemangioma of skin and subcutaneous tissue: Secondary | ICD-10-CM | POA: Diagnosis not present

## 2018-08-07 ENCOUNTER — Other Ambulatory Visit: Payer: Self-pay | Admitting: Family Medicine

## 2018-08-07 NOTE — Telephone Encounter (Signed)
Copied from [place] [id number]. Topic: Quick Communication - Rx Refill/Question >> Aug 07, 2018 12:31 PM Margot Ables wrote: Medication: lisinopril (PRINIVIL,ZESTRIL) 40 MG tablet - pt notes 5 pills left - takes 1/day  Has the patient contacted their pharmacy? No - changed MD recently Preferred Pharmacy (with phone number or street name): [place], [place] - [address]. [phone] (Phone) [phone] (Fax)

## 2018-08-08 NOTE — Telephone Encounter (Signed)
She was a new patient in march

## 2018-08-08 NOTE — Telephone Encounter (Signed)
FYI

## 2018-08-08 NOTE — Telephone Encounter (Signed)
Will route to office for final disposition. 

## 2018-08-09 DIAGNOSIS — M25562 Pain in left knee: Secondary | ICD-10-CM | POA: Diagnosis not present

## 2018-08-09 MED ORDER — LISINOPRIL 40 MG PO TABS: 40.0000 mg | ORAL_TABLET | Freq: Every day | ORAL | 2 refills | Status: DC

## 2018-08-14 ENCOUNTER — Encounter: Payer: Self-pay | Admitting: Family Medicine

## 2018-08-14 ENCOUNTER — Ambulatory Visit: Payer: BLUE CROSS/BLUE SHIELD | Admitting: Family Medicine

## 2018-08-14 ENCOUNTER — Ambulatory Visit: Payer: Self-pay

## 2018-08-14 VITALS — BP 154/90 | HR 74 | Ht 67.0 in | Wt 150.0 lb

## 2018-08-14 DIAGNOSIS — G8929 Other chronic pain: Secondary | ICD-10-CM

## 2018-08-14 DIAGNOSIS — M25562 Pain in left knee: Secondary | ICD-10-CM

## 2018-08-14 DIAGNOSIS — Z96652 Presence of left artificial knee joint: Secondary | ICD-10-CM | POA: Diagnosis not present

## 2018-08-14 DIAGNOSIS — S83002A Unspecified subluxation of left patella, initial encounter: Secondary | ICD-10-CM

## 2018-08-14 HISTORY — DX: Unspecified subluxation of left patella, initial encounter: S83.002A

## 2018-08-14 NOTE — Assessment & Plan Note (Signed)
Patella subluxation.  Brace given, discussed icing regimen and home exercises.  Discussed which activities to do which wants to avoid.  Discussed which activities to do for strengthening.  Patient's underlying multiple sclerosis could be potentially causing discomfort and pain as well.  Follow-up again in 4 to 6 weeks.

## 2018-08-14 NOTE — Patient Instructions (Signed)
Good to see you  Ice 20 minutes 2 times daily. Usually after activity and before bed.  .pennsaid pinkie amount topically 2 times daily as needed.   Wear brace daily  OK to be active but try lower impact when possible  See me again in 3-4 weeks

## 2018-08-14 NOTE — Progress Notes (Signed)
[facility] [place], [place] [address] Phone: [phone] Subjective:    I Cynthia Nichols am serving as a Education administrator for Dr. Hulan Saas.    CC: Left knee pain  [id number]  Cynthia Nichols is a 57 y.o. female coming in with complaint of left knee pain. History of broken patella. Using cane on same side as injured knee. Pain radiates up the leg and stops at the hip. Knee pops sometimes and locks often.  Patient did not fall off a horse but was on a horse at the time.  Started on the provider and had x-rays initially and she states that there was no true fracture.  Onset- Chronic  Location- lateral Character- Throbbing Aggravating factors- Walking, Sitting, standing, turning Reliving factors-  Therapies tried- [employer] out of 10     Past Medical History:  Diagnosis Date  . Adopted    Patient has no known family history  . Asthma   . Headache(784.0)   . 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 Hypertension   . IBS (irritable bowel syndrome)   . MS (multiple sclerosis) ([place])   . PONV (postoperative nausea and vomiting)   . Venous aneurysm    Right side of neck   Past Surgical History:  Procedure Laterality Date  . ABDOMINAL HYSTERECTOMY    . BREAST MASS EXCISION     Bilateral breast mass excision ( Benign)  . ORIF HUMERUS FRACTURE Right 05/17/2017  . TENNIS ELBOW RELEASE/NIRSCHEL PROCEDURE Right 05/17/2017   Procedure: EXPLORATION AND EXPOSURE OF RADIAL NERVE RIGHT ARM;  Surgeon: Roseanne Kaufman, MD;  Location: [facility];  Service: Orthopedics;  Laterality: Right;   Social History   Socioeconomic History  . Marital status: Married    Spouse name: Not on file  . Number of children: Not on file  . Years of education: Not on file  . Highest education level: Not on file  Occupational History  . Not on file  Social Needs  . Financial resource strain: Not on file  . Food insecurity:    Worry: Not on file   Inability: Not on file  . Transportation needs:    Medical: Not on file    Non-medical: Not on file  Tobacco Use  . Smoking status: Never Smoker  . Smokeless tobacco: Never Used  Substance and Sexual Activity  . Alcohol use: No    Alcohol/week: 0.0 standard drinks  . Drug use: No  . Sexual activity: Yes  Lifestyle  . Physical activity:    Days per week: Not on file    Minutes per session: Not on file  . Stress: Not on file  Relationships  . Social connections:    Talks on phone: Not on file    Gets together: Not on file    Attends religious service: Not on file    Active member of club or organization: Not on file    Attends meetings of clubs or organizations: Not on file    Relationship status: Not on file  Other Topics Concern  . Not on file  Social History Narrative  . Not on file   Allergies  Allergen Reactions  . Ampyra [Dalfampridine] Anaphylaxis and Shortness Of Breath    Chest pain, also   . Imitrex [Sumatriptan] Other (See Comments)    Chest tightness  . Crestor [Rosuvastatin Calcium] Other (See Comments)    Abdominal pain, shortness of breath  .  Peanut-Containing Drug Products Itching    Itching in the mouth (remarked that she still eats this in limited amounts)  . Shrimp [Shellfish Allergy] Itching    Itching in the mouth (remarked that she still eats this in limited amounts)   Family History  Adopted: Yes     Current Outpatient Medications (Cardiovascular):  .  bisoprolol (ZEBETA) 5 MG tablet, Take 2.5 mg by mouth daily. Marland Kitchen  lisinopril (PRINIVIL,ZESTRIL) 40 MG tablet, Take 1 tablet (40 mg total) by mouth at bedtime. .  pravastatin (PRAVACHOL) 80 MG tablet, Take 1 tablet (80 mg total) by mouth daily.  Current Outpatient Medications (Respiratory):  .  albuterol (PROVENTIL HFA) 108 (90 Base) MCG/ACT inhaler, Inhale into the lungs every 6 (six) hours as needed.  .  benzonatate (TESSALON) 100 MG capsule, Take 1-2 capsules (100-200 mg total) by mouth 3  (three) times daily. (Patient not taking: Reported on 08/14/2018) .  budesonide-formoterol (SYMBICORT) 160-4.5 MCG/ACT inhaler, Inhale 2 puffs into the lungs 2 (two) times daily.  Current Outpatient Medications (Analgesics):  .  butalbital-acetaminophen-caffeine (FIORICET, ESGIC) 50-325-40 MG per tablet, Take 1 tablet by mouth 2 (two) times daily as needed for migraine.  .  naratriptan (AMERGE) 2.5 MG tablet, naratriptan 2.5 mg tablet   Current Outpatient Medications (Other):  .  amphetamine-dextroamphetamine (ADDERALL XR) 10 MG 24 hr capsule, Adderall XR 10 mg capsule,extended release .  Calcium-Magnesium-Vitamin D (CALCIUM 1200+D3 PO), Take 1 tablet by mouth 2 (two) times daily. .  cephALEXin (KEFLEX) 250 MG capsule, cephalexin 250 mg capsule  TAKE 1 CAPSULE BY MOUTH ONCE DAILY AT BEDTIME .  Cholecalciferol (VITAMIN D3 SUPER STRENGTH) 2000 units TABS, Take 4,000 Units by mouth. .  DULoxetine (CYMBALTA) 30 MG capsule, duloxetine 30 mg capsule,delayed release .  gabapentin (NEURONTIN) 800 MG tablet, Take 1 tablet (800 mg total) by mouth at bedtime. (per neurologist) .  zolpidem (AMBIEN) 5 MG tablet, Take 5 mg by mouth at bedtime.  Marland Kitchen  zonisamide (ZONEGRAN) 100 MG capsule, Take 200 mg by mouth at bedtime. Marland Kitchen  doxycycline (VIBRA-TABS) 100 MG tablet, Take 1 tablet (100 mg total) by mouth 2 (two) times daily. (Patient not taking: Reported on 08/14/2018)    Past medical history, social, surgical and family history all reviewed in electronic medical record.  No pertanent information unless stated regarding to the chief complaint.   Review of Systems:  No headache, visual changes, nausea, vomiting, diarrhea, constipation, dizziness, abdominal pain, skin rash, fevers, chills, night sweats, weight loss, swollen lymph nodes, body aches, joint swelling, chest pain, shortness of breath, mood changes.  Positive muscle aches  Objective  Blood pressure (!) 154/90, pulse 74, height 5\' 7"  (1.702 m), weight  150 lb (68 kg), SpO2 98 %.    General: No apparent distress alert and oriented x3 mood and affect normal, dressed appropriately.  HEENT: Pupils equal, extraocular movements intact  Respiratory: Patient's speak in full sentences and does not appear short of breath  Cardiovascular: No lower extremity edema, non tender, no erythema  Skin: Warm dry intact with no signs of infection or rash on extremities or on axial skeleton.  Abdomen: Soft nontender  Neuro: Cranial nerves II through XII are intact, neurovascularly intact in all extremities with 2+ DTRs and 2+ pulses.  Lymph: No lymphadenopathy of posterior or anterior cervical chain or axillae bilaterally.  Gait antalgic gait walking with the aid of a cane MSK: Mild tender with full range of motion and good stability and symmetric strength and mild decrease in  tone of shoulders, elbows, wrist, hip, and ankles bilaterally.   Knee exam does have some very mild instability but seems to be some mild laxity of the MCL and LCL but endpoint noted.  Patient does have crepitus.  Patellar does have significant crepitus noted and some lateral translation noted.  Weakness of the VMO noted.  Limited musculoskeletal ultrasound was performed and interpreted by Lyndal Pulley  Limited ultrasound patient patella shows no new cortical defect.  Patient does have what appears to be a slight hematoma deep to the patella itself.  Mild narrowing of the cartilage on the superior lateral aspect. 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 The above documentation has been reviewed and is accurate and complete Lyndal Pulley, DO       Note: This dictation was prepared with Dragon dictation along with smaller phrase technology. Any transcriptional errors that result from this process are unintentional.

## 2018-08-22 DIAGNOSIS — J019 Acute sinusitis, unspecified: Secondary | ICD-10-CM | POA: Diagnosis not present

## 2018-08-22 DIAGNOSIS — J4530 Mild persistent asthma, uncomplicated: Secondary | ICD-10-CM | POA: Diagnosis not present

## 2018-08-22 DIAGNOSIS — J209 Acute bronchitis, unspecified: Secondary | ICD-10-CM | POA: Diagnosis not present

## 2018-08-22 DIAGNOSIS — H1045 Other chronic allergic conjunctivitis: Secondary | ICD-10-CM | POA: Diagnosis not present

## 2018-08-29 DIAGNOSIS — N3020 Other chronic cystitis without hematuria: Secondary | ICD-10-CM | POA: Diagnosis not present

## 2018-08-29 DIAGNOSIS — N319 Neuromuscular dysfunction of bladder, unspecified: Secondary | ICD-10-CM | POA: Diagnosis not present

## 2018-09-10 NOTE — Progress Notes (Signed)
[facility] [place], [place] [address] Phone: [phone] Subjective:    I Cynthia Nichols am serving as a Education administrator for Dr. Hulan Saas.   I'm seeing this patient by the request  of:    CC: Left knee pain follow-up  [id number]  Cynthia Nichols is a 57 y.o. female coming in with complaint of knee pain. States that the knee is doing well. Standing still gives her pain but she is making improvements.  Patient was found to have more of a patella subluxation.  Patient states that the knee is feeling better overall.  States that the swelling is improved.  States some mild instability but as long as she wears a brace seems to be doing relatively well.     Past Medical History:  Diagnosis Date  . Adopted    Patient has no known family history  . Asthma   . Headache(784.0)   . 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 Hypertension   . IBS (irritable bowel syndrome)   . MS (multiple sclerosis) ([place])   . PONV (postoperative nausea and vomiting)   . Venous aneurysm    Right side of neck   Past Surgical History:  Procedure Laterality Date  . ABDOMINAL HYSTERECTOMY    . BREAST MASS EXCISION     Bilateral breast mass excision ( Benign)  . ORIF HUMERUS FRACTURE Right 05/17/2017  . TENNIS ELBOW RELEASE/NIRSCHEL PROCEDURE Right 05/17/2017   Procedure: EXPLORATION AND EXPOSURE OF RADIAL NERVE RIGHT ARM;  Surgeon: Roseanne Kaufman, MD;  Location: [facility];  Service: Orthopedics;  Laterality: Right;   Social History   Socioeconomic History  . Marital status: Married    Spouse name: Not on file  . Number of children: Not on file  . Years of education: Not on file  . Highest education level: Not on file  Occupational History  . Not on file  Social Needs  . Financial resource strain: Not on file  . Food insecurity:    Worry: Not on file    Inability: Not on file  . Transportation needs:    Medical: Not on file    Non-medical: Not  on file  Tobacco Use  . Smoking status: Never Smoker  . Smokeless tobacco: Never Used  Substance and Sexual Activity  . Alcohol use: No    Alcohol/week: 0.0 standard drinks  . Drug use: No  . Sexual activity: Yes  Lifestyle  . Physical activity:    Days per week: Not on file    Minutes per session: Not on file  . Stress: Not on file  Relationships  . Social connections:    Talks on phone: Not on file    Gets together: Not on file    Attends religious service: Not on file    Active member of club or organization: Not on file    Attends meetings of clubs or organizations: Not on file    Relationship status: Not on file  Other Topics Concern  . Not on file  Social History Narrative  . Not on file   Allergies  Allergen Reactions  . Ampyra [Dalfampridine] Anaphylaxis and Shortness Of Breath    Chest pain, also   . Imitrex [Sumatriptan] Other (See Comments)    Chest tightness  . Crestor [Rosuvastatin Calcium] Other (See Comments)    Abdominal pain, shortness of breath  . Peanut-Containing Drug Products Itching    Itching in the  mouth (remarked that she still eats this in limited amounts)  . Shrimp [Shellfish Allergy] Itching    Itching in the mouth (remarked that she still eats this in limited amounts)   Family History  Adopted: Yes     Current Outpatient Medications (Cardiovascular):  .  bisoprolol (ZEBETA) 5 MG tablet, Take 2.5 mg by mouth daily. Marland Kitchen  lisinopril (PRINIVIL,ZESTRIL) 40 MG tablet, Take 1 tablet (40 mg total) by mouth at bedtime. .  pravastatin (PRAVACHOL) 80 MG tablet, Take 1 tablet (80 mg total) by mouth daily.  Current Outpatient Medications (Respiratory):  .  albuterol (PROVENTIL HFA) 108 (90 Base) MCG/ACT inhaler, Inhale into the lungs every 6 (six) hours as needed.  .  benzonatate (TESSALON) 100 MG capsule, Take 1-2 capsules (100-200 mg total) by mouth 3 (three) times daily. .  budesonide-formoterol (SYMBICORT) 160-4.5 MCG/ACT inhaler, Inhale 2 puffs  into the lungs 2 (two) times daily.  Current Outpatient Medications (Analgesics):  .  butalbital-acetaminophen-caffeine (FIORICET, ESGIC) 50-325-40 MG per tablet, Take 1 tablet by mouth 2 (two) times daily as needed for migraine.  .  naratriptan (AMERGE) 2.5 MG tablet, naratriptan 2.5 mg tablet   Current Outpatient Medications (Other):  .  amphetamine-dextroamphetamine (ADDERALL XR) 10 MG 24 hr capsule, Adderall XR 10 mg capsule,extended release .  Calcium-Magnesium-Vitamin D (CALCIUM 1200+D3 PO), Take 1 tablet by mouth 2 (two) times daily. .  cephALEXin (KEFLEX) 250 MG capsule, cephalexin 250 mg capsule  TAKE 1 CAPSULE BY MOUTH ONCE DAILY AT BEDTIME .  Cholecalciferol (VITAMIN D3 SUPER STRENGTH) 2000 units TABS, Take 4,000 Units by mouth. .  doxycycline (VIBRA-TABS) 100 MG tablet, Take 1 tablet (100 mg total) by mouth 2 (two) times daily. .  DULoxetine (CYMBALTA) 30 MG capsule, duloxetine 30 mg capsule,delayed release .  gabapentin (NEURONTIN) 800 MG tablet, Take 1 tablet (800 mg total) by mouth at bedtime. (per neurologist) .  zolpidem (AMBIEN) 5 MG tablet, Take 5 mg by mouth at bedtime.  Marland Kitchen  zonisamide (ZONEGRAN) 100 MG capsule, Take 200 mg by mouth at bedtime.    Past medical history, social, surgical and family history all reviewed in electronic medical record.  No pertanent information unless stated regarding to the chief complaint.   Review of Systems:  No headache, visual changes, nausea, vomiting, diarrhea, constipation, dizziness, abdominal pain, skin rash, fevers, chills, night sweats, weight loss, swollen lymph nodes, body aches, joint swelling, chest pain, shortness of breath, mood changes.  Positive muscle aches  Objective  Blood pressure 140/72, pulse 70, height 5\' 7"  (1.702 m), weight 152 lb (68.9 kg), SpO2 98 %.    General: No apparent distress alert and oriented x3 mood and affect normal, dressed appropriately.  HEENT: Pupils equal, extraocular movements intact    Respiratory: Patient's speak in full sentences and does not appear short of breath  Cardiovascular: No lower extremity edema, non tender, no erythema  Skin: Warm dry intact with no signs of infection or rash on extremities or on axial skeleton.  Abdomen: Soft nontender  Neuro: Cranial nerves II through XII are intact, neurovascularly intact in all extremities with 2+ DTRs and 2+ pulses.  Lymph: No lymphadenopathy of posterior or anterior cervical chain or axillae bilaterally.  Gait severely antalgic MSK:  Non tender with full range of motion and good stability and symmetric strength and tone of shoulders, elbows, wrist, hip, and ankles bilaterally.  Knee: Left Mild lateral displacement of the patella noted still Palpation normal with no warmth, joint line tenderness, patellar tenderness, or  condyle tenderness. ROM full in flexion and extension and lower leg rotation.  Patient does have pain with a clunking sensation of the patella with extension from 90 degrees to 130 Mild instability overall Negative Mcmurray's, Apley's, and Thessalonian tests. Severely painful patellar compression. Patellar glide with moderate crepitus. Patellar and quadriceps tendons unremarkable. Hamstring and quadriceps strength is normal. Contralateral knee unremarkable     Impression and Recommendations:      The above documentation has been reviewed and is accurate and complete Lyndal Pulley, DO       Note: This dictation was prepared with Dragon dictation along with smaller phrase technology. Any transcriptional errors that result from this process are unintentional.

## 2018-09-11 ENCOUNTER — Encounter: Payer: Self-pay | Admitting: Family Medicine

## 2018-09-11 ENCOUNTER — Ambulatory Visit: Payer: BLUE CROSS/BLUE SHIELD | Admitting: Family Medicine

## 2018-09-11 VITALS — BP 140/72 | HR 70 | Ht 67.0 in | Wt 152.0 lb

## 2018-09-11 DIAGNOSIS — M25562 Pain in left knee: Secondary | ICD-10-CM | POA: Diagnosis not present

## 2018-09-11 DIAGNOSIS — G8929 Other chronic pain: Secondary | ICD-10-CM

## 2018-09-11 DIAGNOSIS — S83002A Unspecified subluxation of left patella, initial encounter: Secondary | ICD-10-CM | POA: Diagnosis not present

## 2018-09-11 NOTE — Patient Instructions (Addendum)
Geat to see you  Good luck with the horse Stay active PT should be calling  See me again in 6 weeks

## 2018-09-11 NOTE — Assessment & Plan Note (Signed)
Patient has had more of a patella subluxation.  Discussed with patient in great length.  Continue the brace with increasing activity.  Discussed with patient about home exercises and will refer to formal physical therapy that I think will be beneficial.  Patient does have some instability in the knee and advanced imaging may be warranted.  We also discussed with the underlying arthritis the patient could be a candidate for Visco supplementation in the long run.  Follow-up again in 8 weeks.

## 2018-09-27 ENCOUNTER — Encounter: Payer: Self-pay | Admitting: Family Medicine

## 2018-09-27 IMAGING — DX DG HUMERUS 2V *R*
3 series · 3 of 3 positions shown · non-contrast
Comparison: Pre reduction exam earlier this day.

CLINICAL DATA: Fracture, postreduction.

EXAM:
RIGHT HUMERUS - 2+ VIEW

[x humerus ap right]
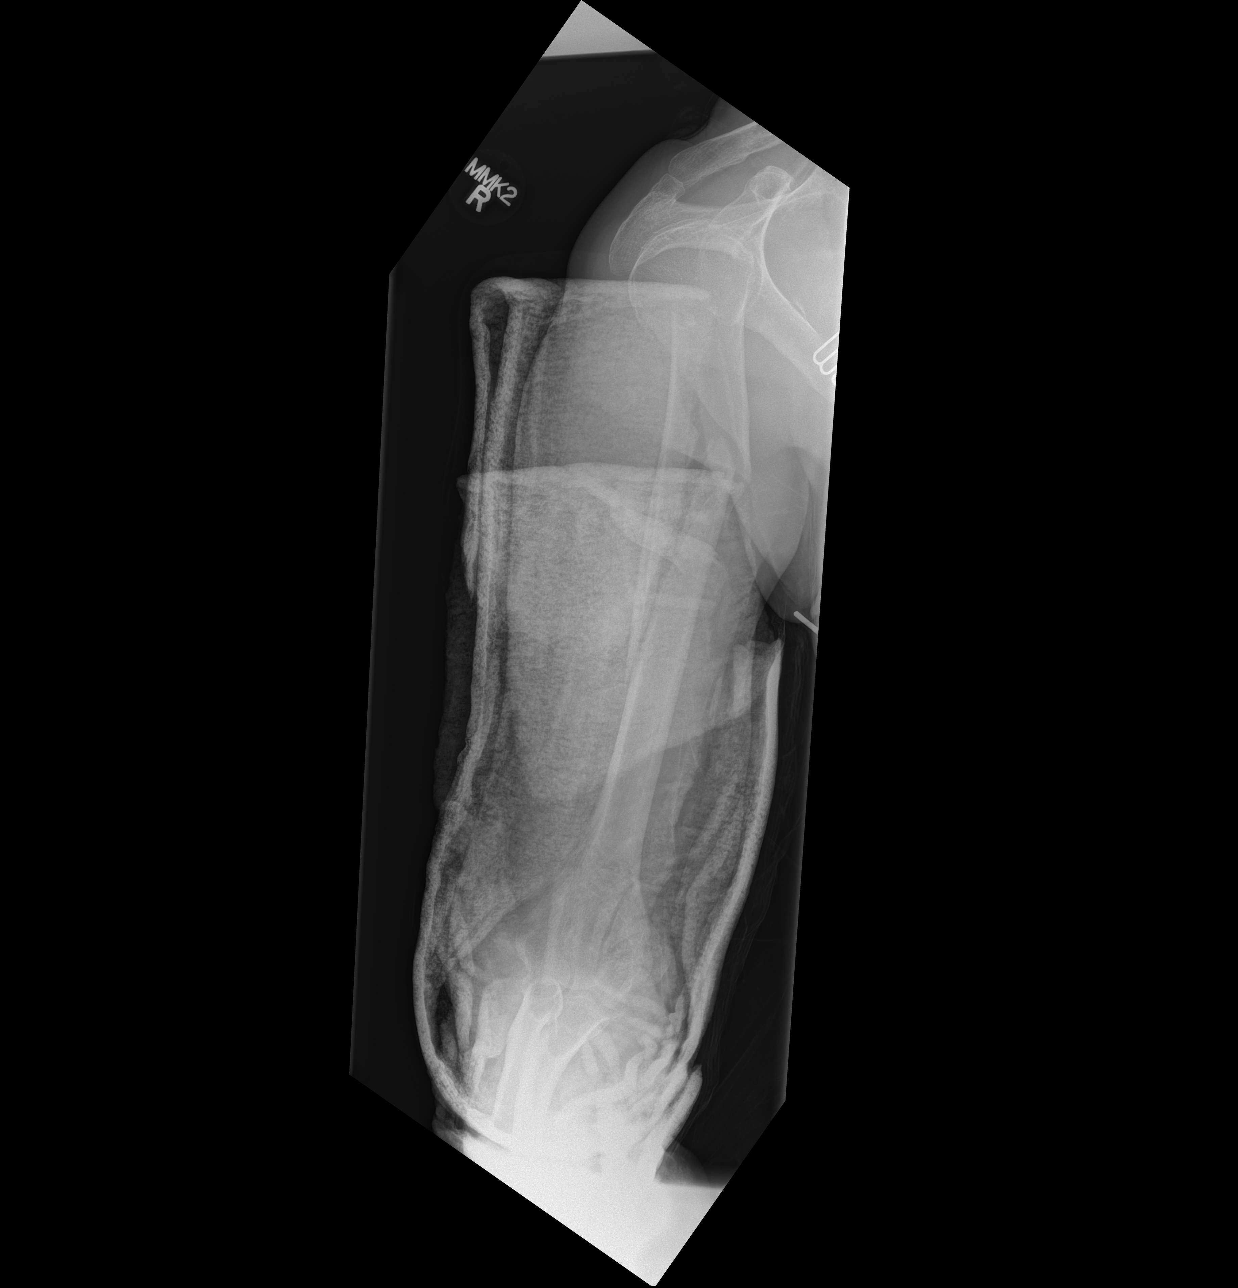

[x humerus lat right]
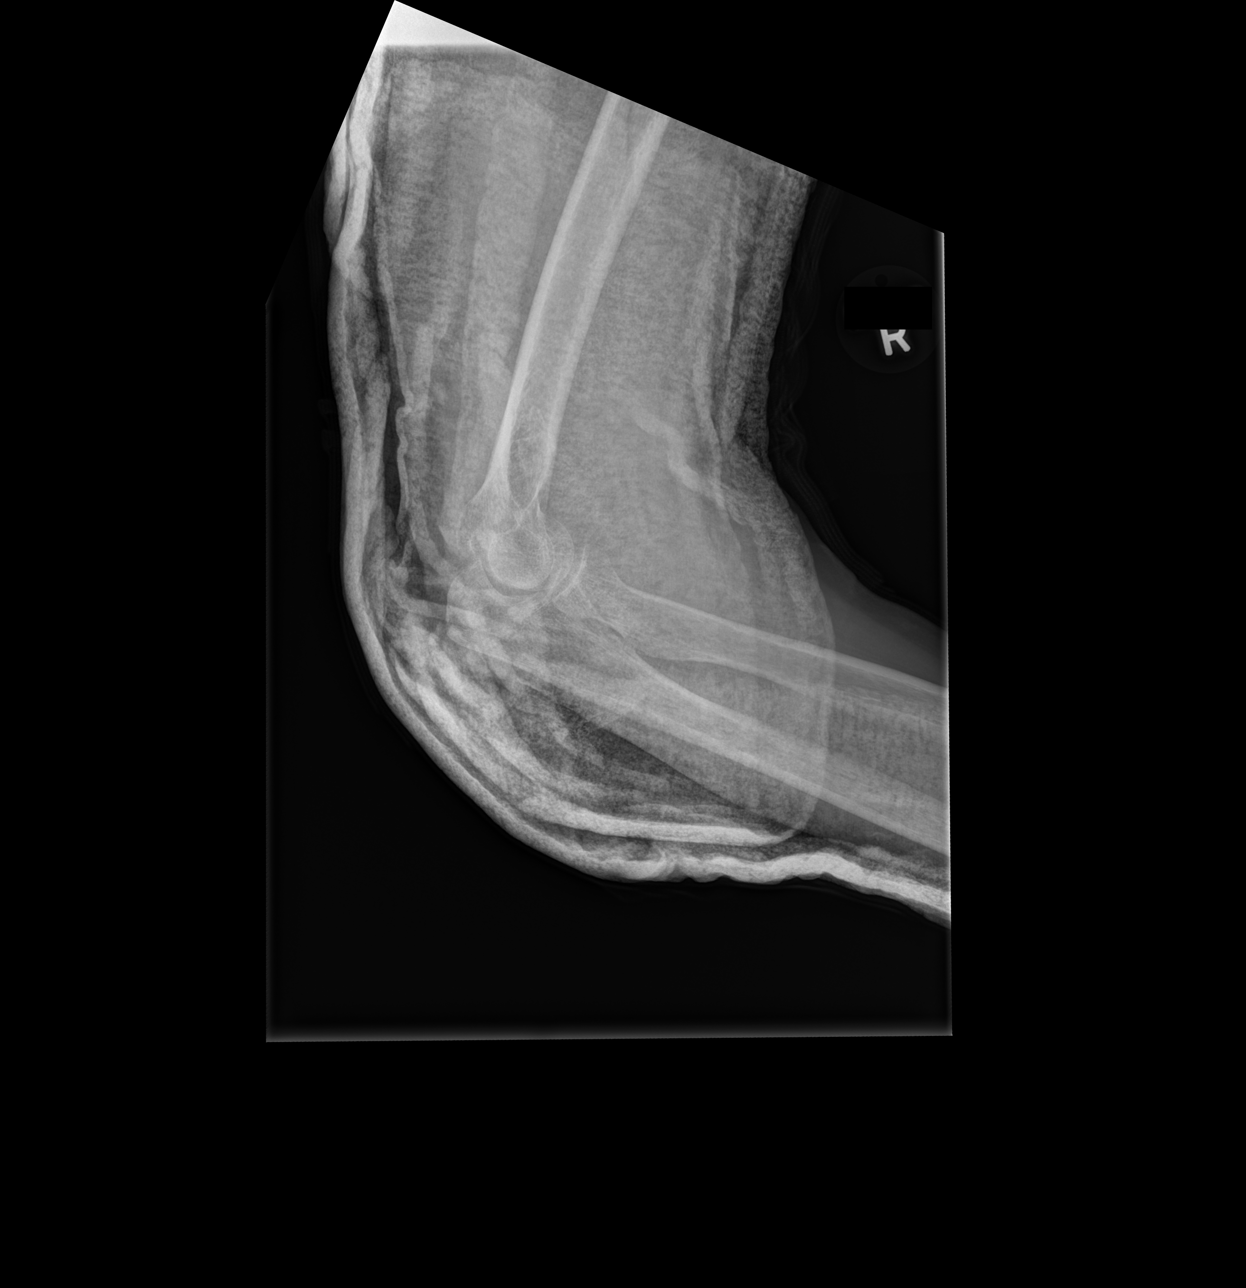

[w trans-thoracic humerus]
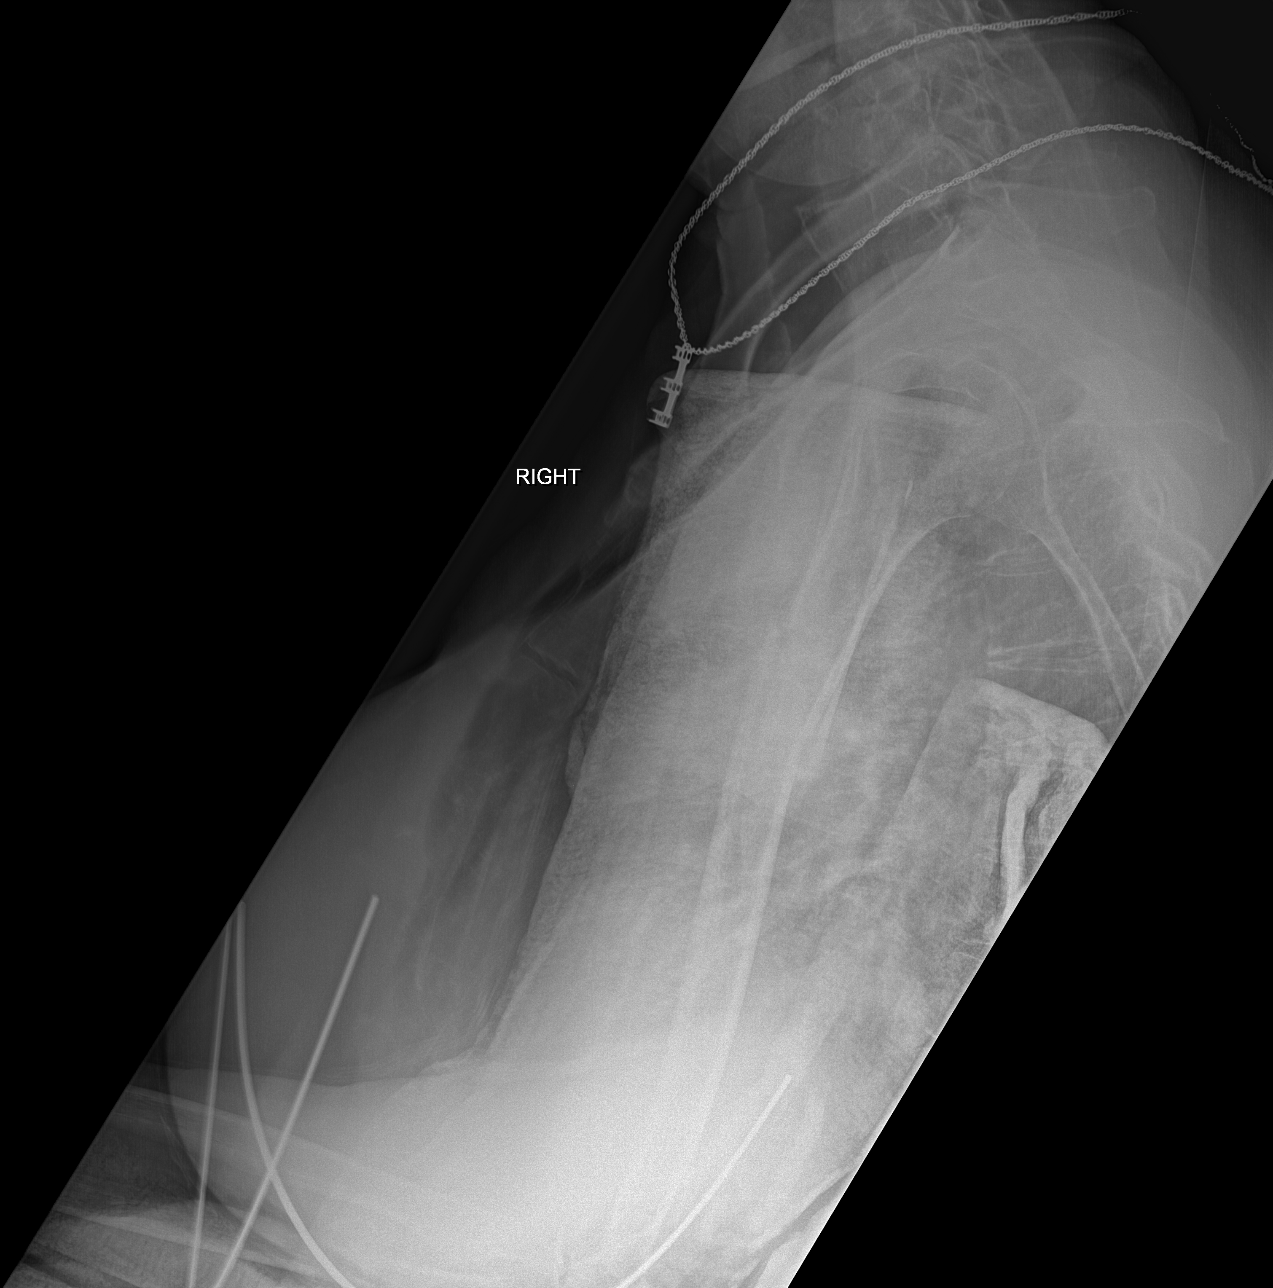

[3 of 3 positions shown; findings below may reference images not displayed]

FINDINGS: Improved alignment of the comminuted displaced angulated mid
proximal humeral fracture postreduction. Mild persistent apex medial
angulation persists. Overlying splint material obscures osseous and
soft tissue fine detail.
IMPRESSION: Improved alignment of the comminuted displaced humeral fracture
postreduction.

## 2018-09-27 IMAGING — DX DG SHOULDER 2+V*R*
2 series · 2 of 2 positions shown · non-contrast
Comparison: None.

CLINICAL DATA: Right upper arm deformity after fall.

EXAM:
RIGHT SHOULDER - 2+ VIEW

[shoulder grashey]
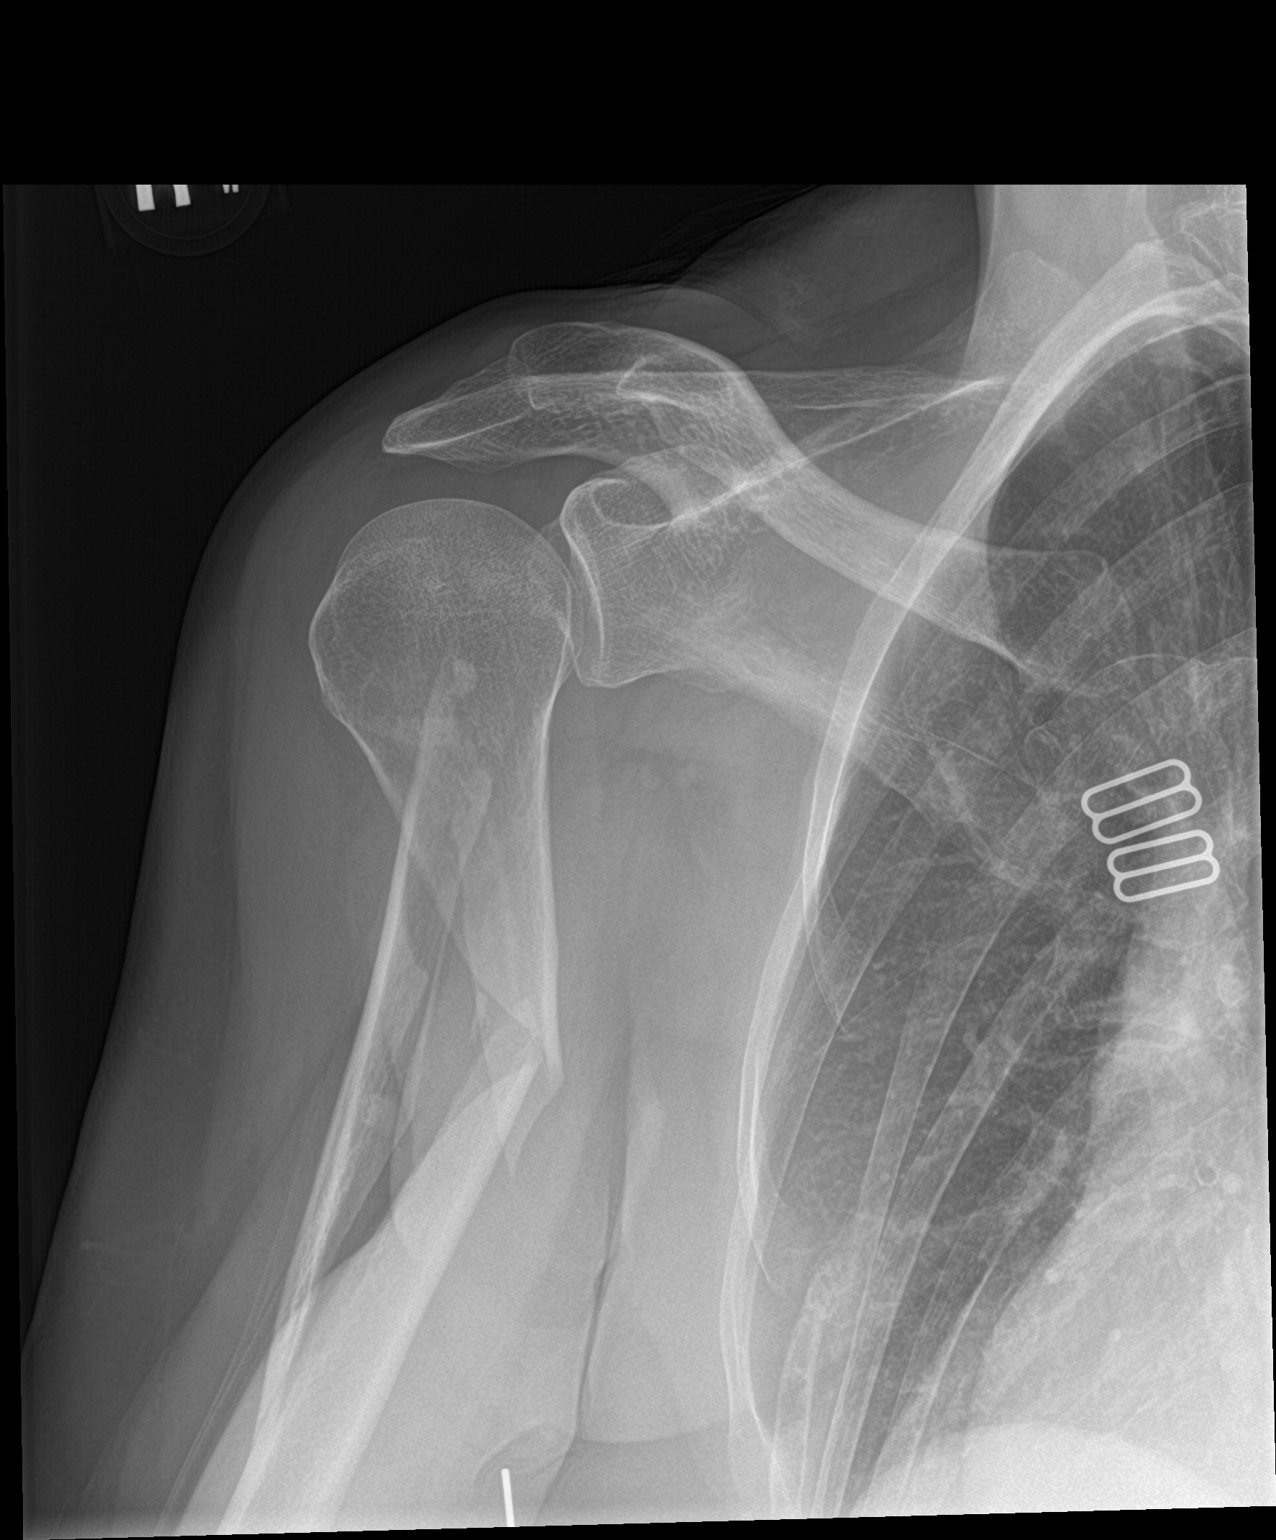

[shoulder y view]
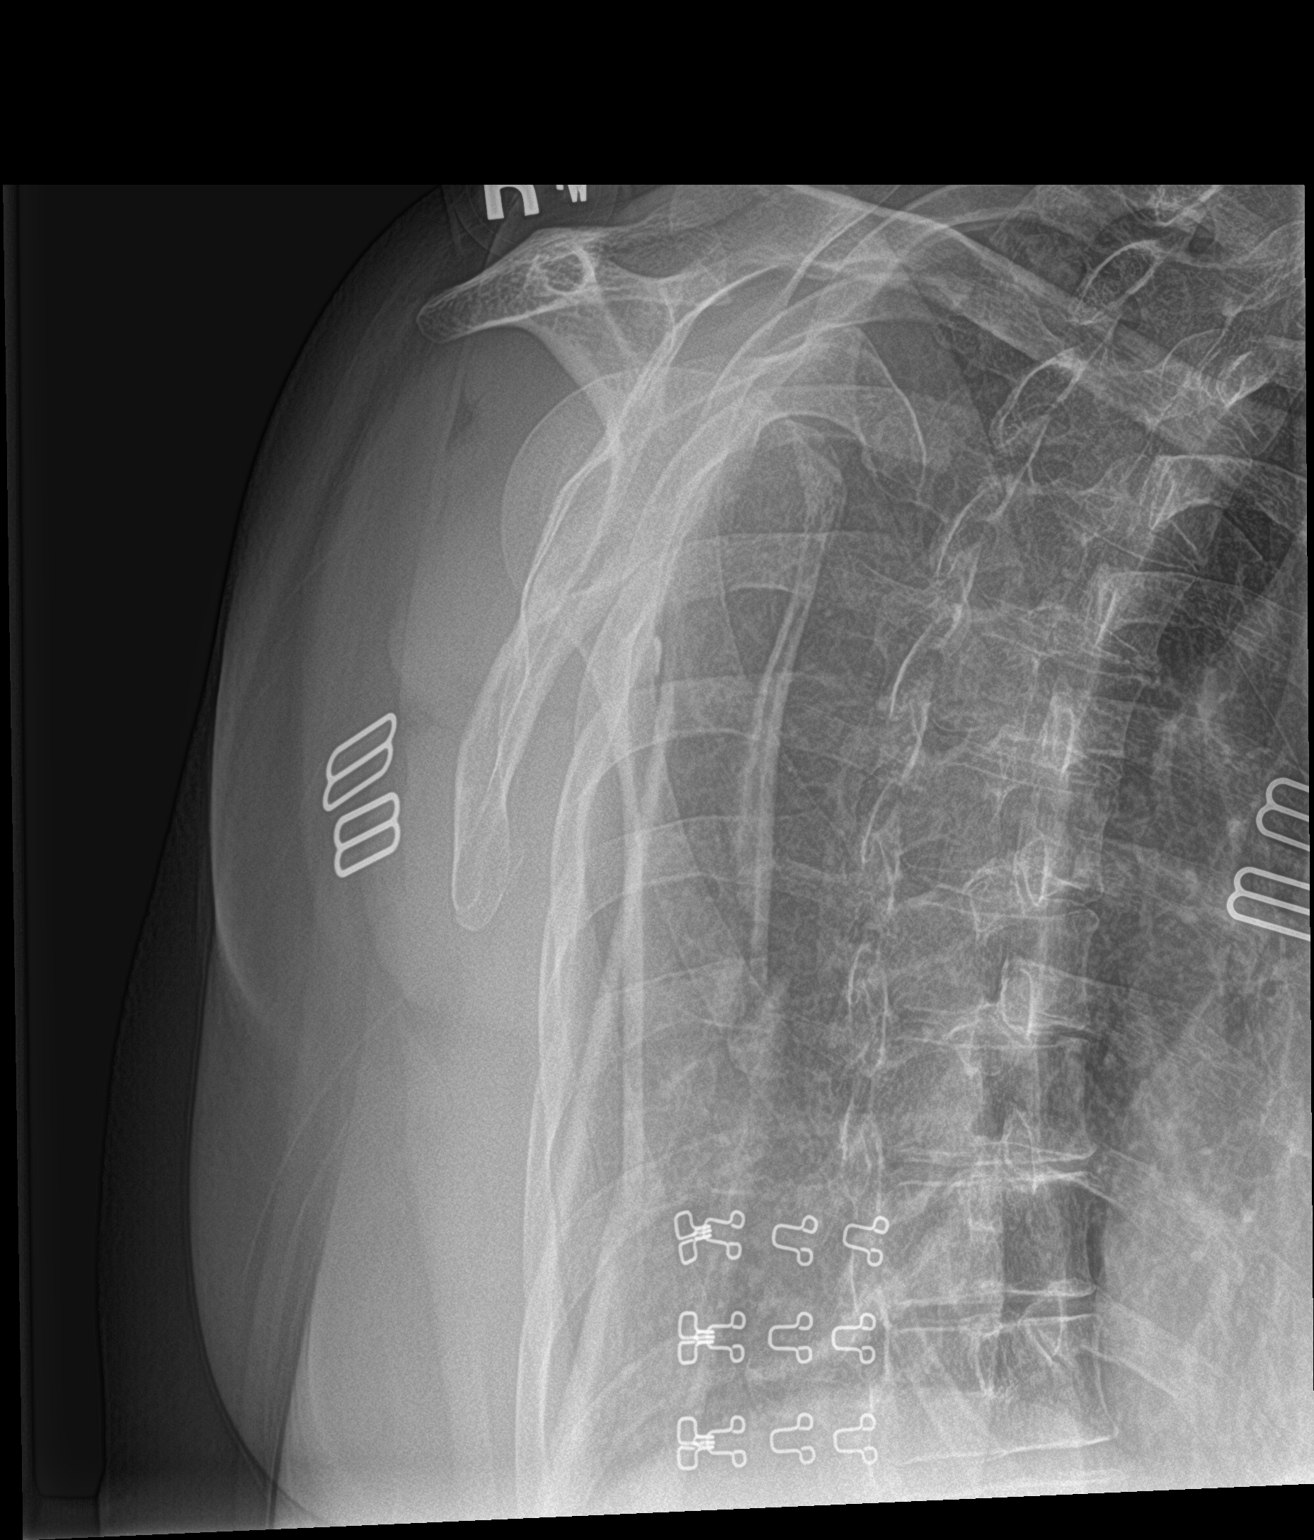

[2 of 2 positions shown; findings below may reference images not displayed]

FINDINGS: Moderately angulated and comminuted fracture of the proximal right
humeral shaft is noted. No soft tissue abnormality is noted.
IMPRESSION: Moderately angulated and comminuted proximal right humeral shaft
fracture.

## 2018-09-27 IMAGING — DX DG HUMERUS 2V *R*
2 series · 2 of 2 positions shown · non-contrast
Comparison: None.

CLINICAL DATA: Right upper arm deformity after fall.

EXAM:
RIGHT HUMERUS - 2+ VIEW

[humerus ap]
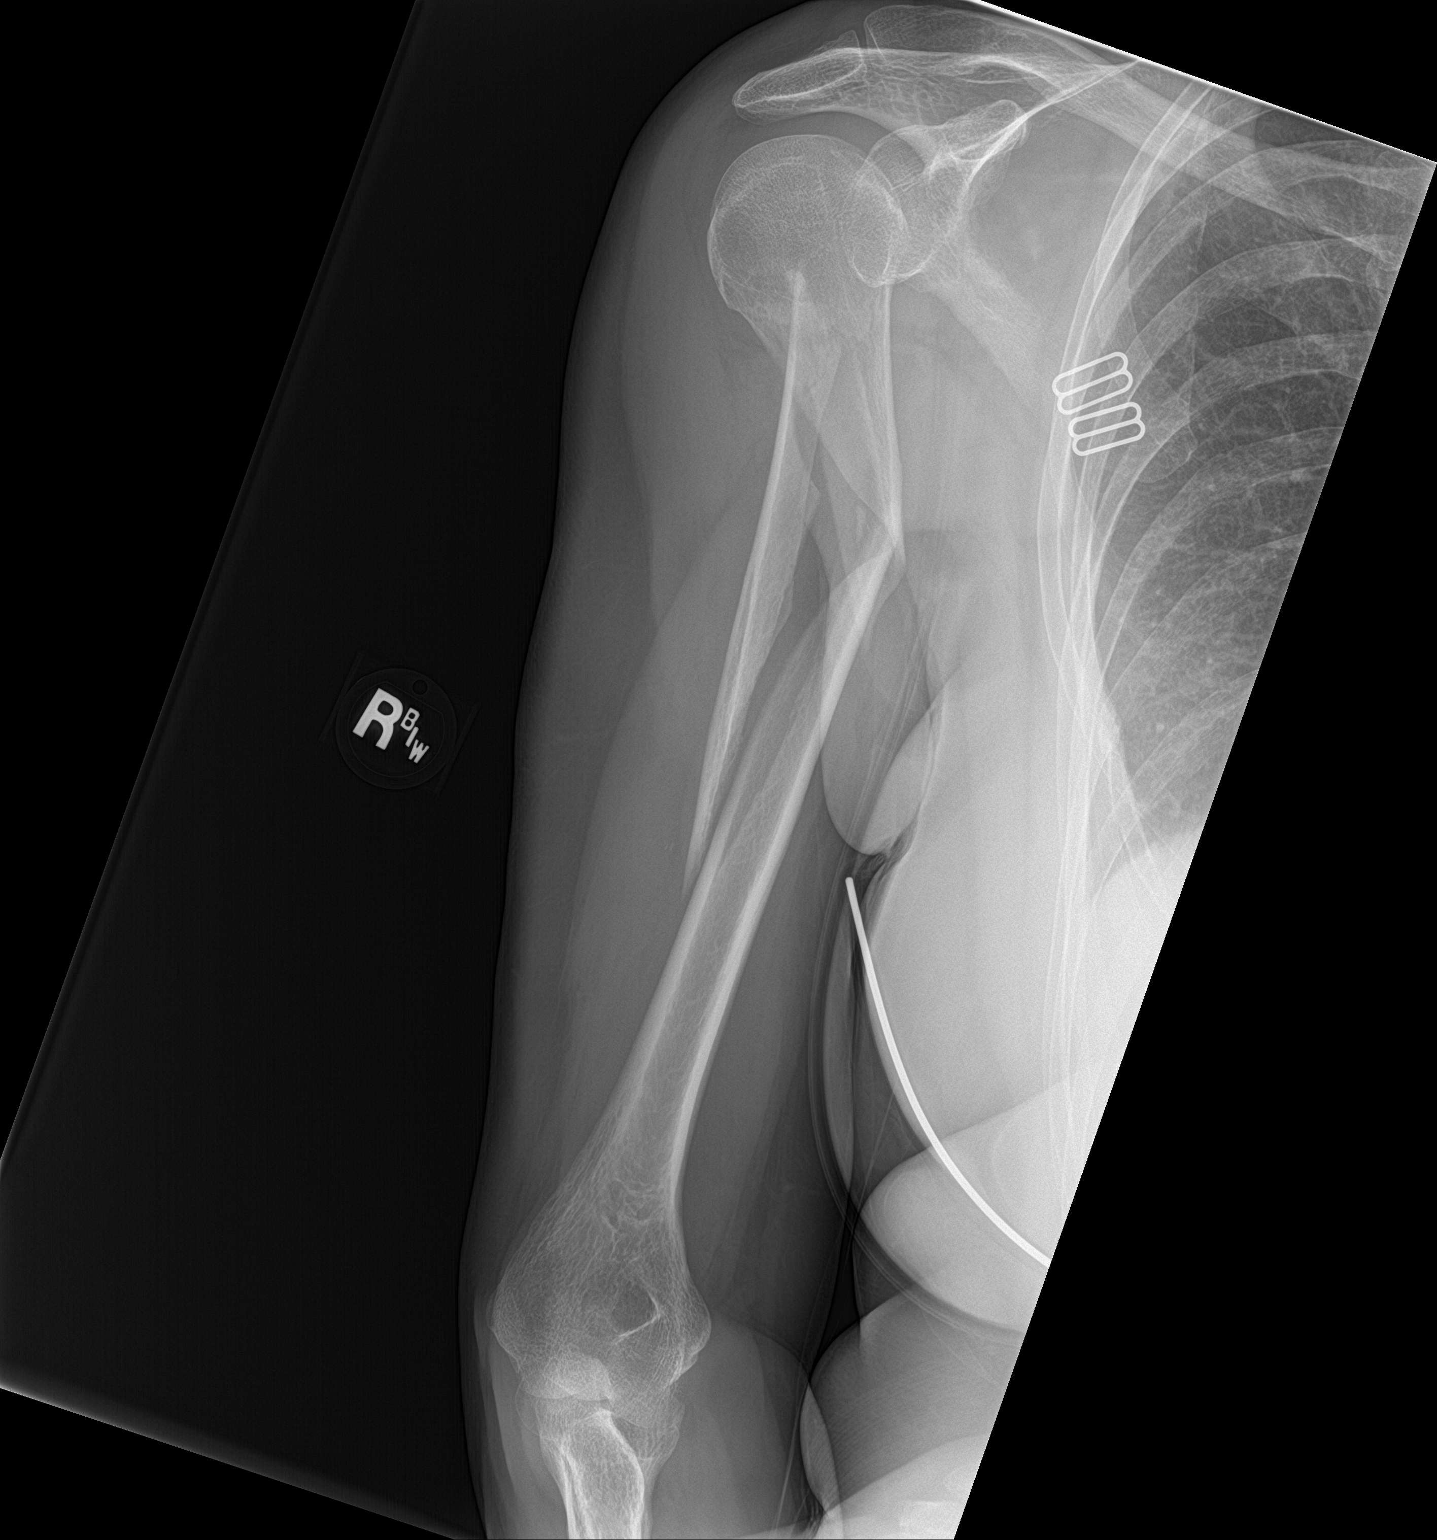

[humerus lat]
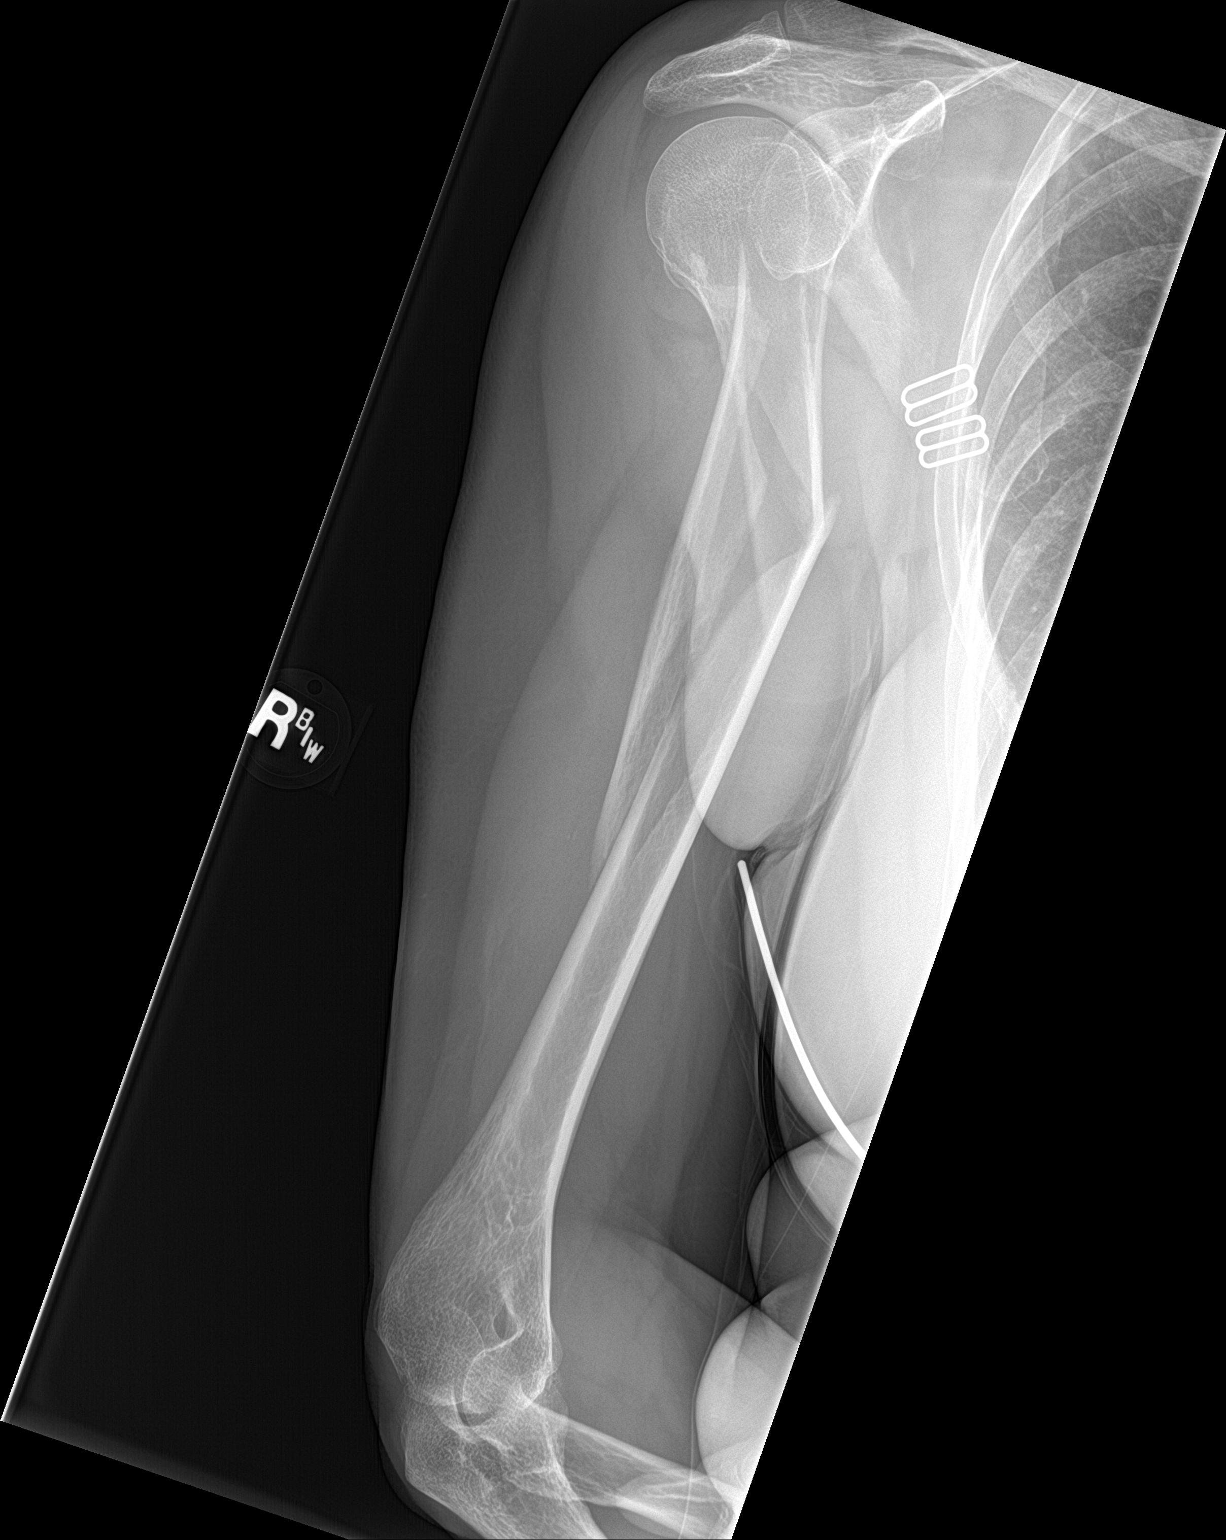

[2 of 2 positions shown; findings below may reference images not displayed]

FINDINGS: Moderately angulated and comminuted fracture is seen involving the
proximal right humeral shaft. No soft tissue abnormality is noted.
IMPRESSION: Moderately angulated and comminuted proximal right humeral shaft
fracture.

## 2018-09-29 ENCOUNTER — Ambulatory Visit: Payer: BLUE CROSS/BLUE SHIELD | Admitting: Family Medicine

## 2018-09-29 ENCOUNTER — Encounter: Payer: Self-pay | Admitting: Family Medicine

## 2018-09-29 VITALS — BP 118/70 | HR 78 | Temp 97.9°F | Ht 67.0 in | Wt 154.5 lb

## 2018-09-29 DIAGNOSIS — D3701 Neoplasm of uncertain behavior of lip: Secondary | ICD-10-CM | POA: Diagnosis not present

## 2018-09-29 DIAGNOSIS — J3489 Other specified disorders of nose and nasal sinuses: Secondary | ICD-10-CM

## 2018-09-29 MED ORDER — AMOXICILLIN-POT CLAVULANATE 875-125 MG PO TABS: 1.0000 | ORAL_TABLET | Freq: Two times a day (BID) | ORAL | 0 refills | Status: AC

## 2018-09-29 NOTE — Progress Notes (Signed)
BP 118/70   Pulse 78   Temp 97.9 F (36.6 C) (Oral)   Ht 5\' 7"  (1.702 m)   Wt 154 lb 8 oz (70.1 kg)   SpO2 98%   BMI 24.20 kg/m    Subjective:    Patient ID: Cynthia Nichols, female    DOB: September 23, 1960, 58 y.o.   MRN: [id number]  HPI: Cynthia Nichols is a 58 y.o. female  Chief Complaint  Patient presents with  . Oral Swelling    spot on right side of lip hx of cold sores but not her typical.  Onset 1.5weeks    HPI She has a place on her lip; lower right side Getting bigger and spreading  Painful to the touch Hx of cold sores She says it started like a cold sore, but not acting like a cold sore She uses silver paste and that usually takes care of it and goes away Tried herpecin and abreva in the past and not usually helpful Has not taken valacyclovir recently No fevers The right cheek has been tender to the touch; right ear has been tender Just inside No drainage from the right ear; nothing from the sinuses, cannot get anything out but feels like there is something up there No travel No sore throat No swollen glands in the neck  Depression screen [facility] 2/9 09/29/2018 11/29/2017  Decreased Interest 0 0  Down, Depressed, Hopeless 0 0  PHQ - 2 Score 0 0  Altered sleeping 0 -  Tired, decreased energy 0 -  Change in appetite 0 -  Feeling bad or failure about yourself  0 -  Trouble concentrating 0 -  Moving slowly or fidgety/restless 0 -  Suicidal thoughts 0 -  PHQ-9 Score 0 -  Difficult doing work/chores Not difficult at all -   Fall Risk  09/29/2018 11/29/2017  Falls in the past year? 1 Yes  Number falls in past yr: 1 2 or more  Injury with Fall? 1 Yes  Risk Factor Category  - High Fall Risk  Risk for fall due to : Impaired balance/gait;History of fall(s);Impaired mobility -    Relevant past medical, surgical, family and social history reviewed Past Medical History:  Diagnosis Date  . Adopted    Patient has no known family history  . Asthma   .  Headache(784.0)   . 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 Hypertension   . IBS (irritable bowel syndrome)   . MS (multiple sclerosis) ([place])   . PONV (postoperative nausea and vomiting)   . Venous aneurysm    Right side of neck   Past Surgical History:  Procedure Laterality Date  . ABDOMINAL HYSTERECTOMY    . BREAST MASS EXCISION     Bilateral breast mass excision ( Benign)  . ORIF HUMERUS FRACTURE Right 05/17/2017  . TENNIS ELBOW RELEASE/NIRSCHEL PROCEDURE Right 05/17/2017   Procedure: EXPLORATION AND EXPOSURE OF RADIAL NERVE RIGHT ARM;  Surgeon: Roseanne Kaufman, MD;  Location: [facility];  Service: Orthopedics;  Laterality: Right;   Family History  Adopted: Yes   Social History   Tobacco Use  . Smoking status: Never Smoker  . Smokeless tobacco: Never Used  Substance Use Topics  . Alcohol use: No    Alcohol/week: 0.0 standard drinks  . Drug use: No     Office Visit from 09/29/2018 in [facility]  AUDIT-C Score  0      Interim medical history since last visit  reviewed. Allergies and medications reviewed  Review of Systems Per HPI unless specifically indicated above     Objective:    BP 118/70   Pulse 78   Temp 97.9 F (36.6 C) (Oral)   Ht 5\' 7"  (1.702 m)   Wt 154 lb 8 oz (70.1 kg)   SpO2 98%   BMI 24.20 kg/m   Wt Readings from Last 3 Encounters:  09/29/18 154 lb 8 oz (70.1 kg)  09/11/18 152 lb (68.9 kg)  08/14/18 150 lb (68 kg)    Physical Exam Constitutional:      Appearance: Normal appearance. She is well-developed and normal weight.  HENT:     Head:      Mouth/Throat:     Mouth: Oral lesions (on the lower lip RIGHT side, hard, keratotic lesion; no vesicular component) present.     Tongue: No lesions.     Pharynx: No oropharyngeal exudate or posterior oropharyngeal erythema.  Eyes:     General: No scleral icterus. Cardiovascular:     Rate and Rhythm: Normal rate and regular rhythm.  Pulmonary:     Effort: Pulmonary  effort is normal.     Breath sounds: Normal breath sounds.  Neurological:     Mental Status: She is alert.  Psychiatric:        Behavior: Behavior normal.     Results for orders placed or performed in visit on 02/16/18  HM COLONOSCOPY  Result Value Ref Range   HM Colonoscopy See Report (in chart) See Report (in chart), Patient Reported      Assessment & Plan:   Problem List Items Addressed This Visit    None    Visit Diagnoses    Neoplasm of uncertain behavior of lip    -  Primary   Relevant Medications   amoxicillin-clavulanate (AUGMENTIN) 875-125 MG tablet   Other Relevant Orders   Ambulatory referral to Dermatology   Sinus pain       presume sinus infection; start ABX, caution about C diff, to ER over weekend if wrose       Follow up plan: No follow-ups on file.  An after-visit summary was printed and given to the patient at [facility].  Please see the patient instructions which may contain other information and recommendations beyond what is mentioned above in the assessment and plan.  Meds ordered this encounter  Medications  . amoxicillin-clavulanate (AUGMENTIN) 875-125 MG tablet    Sig: Take 1 tablet by mouth 2 (two) times daily for 10 days.    Dispense:  20 tablet    Refill:  0    Orders Placed This Encounter  Procedures  . Ambulatory referral to Dermatology

## 2018-09-29 NOTE — Patient Instructions (Signed)
Start the antibiotics Please do eat yogurt or kimchi or take a probiotic daily for the next month We want to replace the healthy germs in the gut If you notice foul, watery diarrhea in the next two months, schedule an appointment RIGHT AWAY or go to an urgent care or the emergency room if a holiday or over a weekend  We'll have you see the dermatologist ASAP If you have not heard anything from my staff by Wednesday about any orders/referrals/studies from today, please contact us here to follow-up [phone]

## 2018-10-03 DIAGNOSIS — M25562 Pain in left knee: Secondary | ICD-10-CM | POA: Diagnosis not present

## 2018-10-03 DIAGNOSIS — M79671 Pain in right foot: Secondary | ICD-10-CM | POA: Diagnosis not present

## 2018-10-10 DIAGNOSIS — L219 Seborrheic dermatitis, unspecified: Secondary | ICD-10-CM | POA: Diagnosis not present

## 2018-10-13 DIAGNOSIS — R270 Ataxia, unspecified: Secondary | ICD-10-CM | POA: Diagnosis not present

## 2018-10-13 DIAGNOSIS — M25562 Pain in left knee: Secondary | ICD-10-CM | POA: Diagnosis not present

## 2018-10-13 DIAGNOSIS — R260 Ataxic gait: Secondary | ICD-10-CM | POA: Diagnosis not present

## 2018-10-13 DIAGNOSIS — R600 Localized edema: Secondary | ICD-10-CM | POA: Diagnosis not present

## 2018-10-16 NOTE — Progress Notes (Signed)
[name] [place], [place] [address] Phone: [phone] Subjective:    I [name] am serving as a Education administrator for Dr. Hulan Saas.   I'm seeing this patient by the request  of:    CC: Knee pain and ankle pain  [id number]  KARYS Nichols is a 58 y.o. female coming in with complaint of left knee pain. States that the knee is not doing well. States she fell about 2 weeks ago. States that nothing was broken. Left knee to her ankle is painful. Knee an ankle swollen.   Onset- 2 weeks ago Location- lateral ankle Duration-  Character-aching sensation and sometimes instability noted of the knee Aggravating factors- walking Reliving factors- Ice, heat, topical, medications  Therapies tried-as above Severity-9 out of 10.  Been walking with the aid of a cane     Past Medical History:  Diagnosis Date  . Adopted    Patient has no known family history  . Asthma   . Headache(784.0)   . 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 Hypertension   . IBS (irritable bowel syndrome)   . MS (multiple sclerosis) ([place])   . PONV (postoperative nausea and vomiting)   . Venous aneurysm    Right side of neck   Past Surgical History:  Procedure Laterality Date  . ABDOMINAL HYSTERECTOMY    . BREAST MASS EXCISION     Bilateral breast mass excision ( Benign)  . ORIF HUMERUS FRACTURE Right 05/17/2017  . TENNIS ELBOW RELEASE/NIRSCHEL PROCEDURE Right 05/17/2017   Procedure: EXPLORATION AND EXPOSURE OF RADIAL NERVE RIGHT ARM;  Surgeon: Roseanne Kaufman, MD;  Location: [facility];  Service: Orthopedics;  Laterality: Right;   Social History   Socioeconomic History  . Marital status: Married    Spouse name: Not on file  . Number of children: Not on file  . Years of education: Not on file  . Highest education level: Not on file  Occupational History  . Not on file  Social Needs  . Financial resource strain: Not on file  . Food insecurity:   Worry: Not on file    Inability: Not on file  . Transportation needs:    Medical: Not on file    Non-medical: Not on file  Tobacco Use  . Smoking status: Never Smoker  . Smokeless tobacco: Never Used  Substance and Sexual Activity  . Alcohol use: No    Alcohol/week: 0.0 standard drinks  . Drug use: No  . Sexual activity: Yes  Lifestyle  . Physical activity:    Days per week: Not on file    Minutes per session: Not on file  . Stress: Not on file  Relationships  . Social connections:    Talks on phone: Not on file    Gets together: Not on file    Attends religious service: Not on file    Active member of club or organization: Not on file    Attends meetings of clubs or organizations: Not on file    Relationship status: Not on file  Other Topics Concern  . Not on file  Social History Narrative  . Not on file   Allergies  Allergen Reactions  . Ampyra [Dalfampridine] Anaphylaxis and Shortness Of Breath    Chest pain, also   . Imitrex [Sumatriptan] Other (See Comments)    Chest tightness  . Crestor [Rosuvastatin Calcium] Other (See Comments)    Abdominal pain, shortness of  breath  . Peanut-Containing Drug Products Itching    Itching in the mouth (remarked that she still eats this in limited amounts)  . Shrimp [Shellfish Allergy] Itching    Itching in the mouth (remarked that she still eats this in limited amounts)   Family History  Adopted: Yes     Current Outpatient Medications (Cardiovascular):  .  bisoprolol (ZEBETA) 5 MG tablet, Take 2.5 mg by mouth daily. Marland Kitchen  lisinopril (PRINIVIL,ZESTRIL) 40 MG tablet, Take 1 tablet (40 mg total) by mouth at bedtime. .  pravastatin (PRAVACHOL) 80 MG tablet, Take 1 tablet (80 mg total) by mouth daily. (Patient taking differently: Take 20 mg by mouth daily. )  Current Outpatient Medications (Respiratory):  .  albuterol (PROVENTIL HFA) 108 (90 Base) MCG/ACT inhaler, Inhale into the lungs every 6 (six) hours as needed.  .   budesonide-formoterol (SYMBICORT) 160-4.5 MCG/ACT inhaler, Inhale 2 puffs into the lungs 2 (two) times daily.  Current Outpatient Medications (Analgesics):  .  butalbital-acetaminophen-caffeine (FIORICET, ESGIC) 50-325-40 MG per tablet, Take 1 tablet by mouth 2 (two) times daily as needed for migraine.  .  naratriptan (AMERGE) 2.5 MG tablet, Take 2.5 mg by mouth as needed.    Current Outpatient Medications (Other):  .  amphetamine-dextroamphetamine (ADDERALL XR) 10 MG 24 hr capsule, Take 10 mg by mouth as needed.  .  Calcium-Magnesium-Vitamin D (CALCIUM 1200+D3 PO), Take 1 tablet by mouth 2 (two) times daily. .  cephALEXin (KEFLEX) 250 MG capsule, cephalexin 250 mg capsule  TAKE 1 CAPSULE BY MOUTH ONCE DAILY AT BEDTIME .  Cholecalciferol (VITAMIN D3 SUPER STRENGTH) 2000 units TABS, Take 4,000 Units by mouth. .  DULoxetine (CYMBALTA) 30 MG capsule, Take 60 mg by mouth daily.  Marland Kitchen  gabapentin (NEURONTIN) 800 MG tablet, Take 1 tablet (800 mg total) by mouth at bedtime. (per neurologist) .  zolpidem (AMBIEN) 5 MG tablet, Take 5 mg by mouth at bedtime.  Marland Kitchen  zonisamide (ZONEGRAN) 100 MG capsule, Take 200 mg by mouth at bedtime. .  Vitamin D, Ergocalciferol, (DRISDOL) 1.25 MG (50000 UT) CAPS capsule, Take 1 capsule (50,000 Units total) by mouth every 7 (seven) days.    Past medical history, social, surgical and family history all reviewed in electronic medical record.  No pertanent information unless stated regarding to the chief complaint.   Review of Systems:  No headache, visual changes, nausea, vomiting, diarrhea, constipation, dizziness, abdominal pain, skin rash, fevers, chills, night sweats, weight loss, swollen lymph nodes, body aches,chest pain, shortness of breath, mood changes.  Positive muscle aches, joint swelling  Objective  Blood pressure 110/70, pulse 78, height 5\' 7"  (1.702 m), weight 154 lb (69.9 kg), SpO2 98 %.   General: No apparent distress alert and oriented x3 mood and affect  normal, dressed appropriately.  HEENT: Pupils equal, extraocular movements intact  Respiratory: Patient's speak in full sentences and does not appear short of breath  Cardiovascular: No lower extremity edema, non tender, no erythema  Skin: Warm dry intact with no signs of infection or rash on extremities or on axial skeleton.  Abdomen: Soft nontender  Neuro: Cranial nerves II through XII are intact, neurovascularly intact in all extremities with 2+ DTRs and 2+ pulses.  Lymph: No lymphadenopathy of posterior or anterior cervical chain or axillae bilaterally.  Gait antalgic walking with the aid of a cane MSK:  tender with full range of motion and good stability and symmetric strength and tone of shoulders, elbows, wrist, hip, and bilaterally.  Left ankle exam  does have some swelling laterally.  Severe tenderness to palpation on the posterior aspect of the lateral malleolus.  Mild pain over the foot as well.  Pain over the peroneal's.  Patient does have full range of motion.  No significant instability.  Trace effusion of the joint are noted.  Patient's left knee does have some laxity but does have endpoints in all planes.  Patient's contralateral knee also has some laxity.  Seems to be nearly symmetric.  Trace swelling of the joint noted.  Mild crepitus.  Kneecap severely tender over the superior lateral aspect   Limited musculoskeletal ultrasound was performed and interpreted by Lyndal Pulley  Limited ultrasound of the lateral aspect of the ankle and left shoulder there is an cortical defect noted of the lateral malleolus.  No significant displacement.  Patient does have trace swelling noted or effusion of the joint.  Mild increase in Doppler flow surrounding the cortical defect.  No true callus formation yet Impression and Recommendations:     This case required medical decision making of moderate complexity. The above documentation has been reviewed and is accurate and complete Lyndal Pulley, DO       Note: This dictation was prepared with Dragon dictation along with smaller phrase technology. Any transcriptional errors that result from this process are unintentional.

## 2018-10-17 ENCOUNTER — Encounter: Payer: Self-pay | Admitting: Family Medicine

## 2018-10-17 ENCOUNTER — Ambulatory Visit: Payer: Self-pay

## 2018-10-17 ENCOUNTER — Ambulatory Visit: Payer: BLUE CROSS/BLUE SHIELD | Admitting: Family Medicine

## 2018-10-17 VITALS — BP 110/70 | HR 78 | Ht 67.0 in | Wt 154.0 lb

## 2018-10-17 DIAGNOSIS — M25562 Pain in left knee: Principal | ICD-10-CM

## 2018-10-17 DIAGNOSIS — S8262XA Displaced fracture of lateral malleolus of left fibula, initial encounter for closed fracture: Secondary | ICD-10-CM | POA: Diagnosis not present

## 2018-10-17 DIAGNOSIS — S83002A Unspecified subluxation of left patella, initial encounter: Secondary | ICD-10-CM | POA: Diagnosis not present

## 2018-10-17 DIAGNOSIS — G8929 Other chronic pain: Secondary | ICD-10-CM | POA: Diagnosis not present

## 2018-10-17 HISTORY — DX: Displaced fracture of lateral malleolus of left fibula, initial encounter for closed fracture: S82.62XA

## 2018-10-17 MED ORDER — VITAMIN D (ERGOCALCIFEROL) 1.25 MG (50000 UNIT) PO CAPS: 50000.0000 [IU] | ORAL_CAPSULE | ORAL | 0 refills | Status: DC

## 2018-10-17 NOTE — Assessment & Plan Note (Signed)
No significant instability.  Discussed with patient about a Aircast.  Mild range of motion exercises, vitamin D.  Follow-up again in 4 to 6 weeks

## 2018-10-17 NOTE — Assessment & Plan Note (Signed)
I believe the patient did have more of a subluxation again in the patella.  Discussed with patient about bracing, home exercise, icing regimen.  Patient saw another provider and was put in a knee immobilizer but is unable to wear.  We discussed the Tru pull lite at the moment.  We discussed the possibility of advanced imaging if the pain continues.  Patient has had instability and repetitive difficulty with this previously.  Follow-up with me again in 2 to 3 weeks

## 2018-10-17 NOTE — Patient Instructions (Addendum)
Good to see you  I think the knee brace is fine.  Ankle brace daily for 2 weeks.  Duexis 3 times a day for 3 days.  For the knee take 2 weeks off from rehab and lets see what calms down See me again in 2-3 weeks to make sure dooing better

## 2018-10-24 ENCOUNTER — Ambulatory Visit: Payer: Self-pay | Admitting: Family Medicine

## 2018-10-25 DIAGNOSIS — N390 Urinary tract infection, site not specified: Secondary | ICD-10-CM | POA: Diagnosis not present

## 2018-10-25 DIAGNOSIS — R300 Dysuria: Secondary | ICD-10-CM | POA: Diagnosis not present

## 2018-10-25 DIAGNOSIS — N3020 Other chronic cystitis without hematuria: Secondary | ICD-10-CM | POA: Diagnosis not present

## 2018-10-25 DIAGNOSIS — B961 Klebsiella pneumoniae [K. pneumoniae] as the cause of diseases classified elsewhere: Secondary | ICD-10-CM | POA: Diagnosis not present

## 2018-11-05 NOTE — Progress Notes (Signed)
[name] [place],  [address] Phone: [phone] Subjective:    I Cynthia Nichols am serving as a Education administrator for Dr. Hulan Saas.     CC: Knee and ankle pain  [id number]    10/17/2018 No significant instability.  Discussed with patient about a Aircast.  Mild range of motion exercises, vitamin D.  Follow-up again in 4 to 6 weeks.   believe the patient did have more of a subluxation again in the patella.  Discussed with patient about bracing, home exercise, icing regimen.  Patient saw another provider and was put in a knee immobilizer but is unable to wear.  We discussed the Tru pull lite at the moment.  We discussed the possibility of advanced imaging if the pain continues.  Patient has had instability and repetitive difficulty with this previously.  Follow-up with me again in 2 to 3 weeks.   Updated 11/06/2018 Cynthia Nichols is a 58 y.o. female coming in with complaint of left ankle and knee pain. States that both the ankle and knee are doing better today.   Patient feels that both of them seem to be making progress.  Not having as much significant discomfort at the moment.  Patient is concerned though because he is overall feeling a little bit weaker and concerned that she could be having a multiple sclerosis flare.    Past Medical History:  Diagnosis Date  . Adopted    Patient has no known family history  . Asthma   . Headache(784.0)   . 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 Hypertension   . IBS (irritable bowel syndrome)   . MS (multiple sclerosis) ([place])   . PONV (postoperative nausea and vomiting)   . Venous aneurysm    Right side of neck   Past Surgical History:  Procedure Laterality Date  . ABDOMINAL HYSTERECTOMY    . BREAST MASS EXCISION     Bilateral breast mass excision ( Benign)  . ORIF HUMERUS FRACTURE Right 05/17/2017  . TENNIS ELBOW RELEASE/NIRSCHEL PROCEDURE Right 05/17/2017   Procedure:  EXPLORATION AND EXPOSURE OF RADIAL NERVE RIGHT ARM;  Surgeon: Roseanne Kaufman, MD;  Location: [facility];  Service: Orthopedics;  Laterality: Right;   Social History   Socioeconomic History  . Marital status: Married    Spouse name: Not on file  . Number of children: Not on file  . Years of education: Not on file  . Highest education level: Not on file  Occupational History  . Not on file  Social Needs  . Financial resource strain: Not on file  . Food insecurity:    Worry: Not on file    Inability: Not on file  . Transportation needs:    Medical: Not on file    Non-medical: Not on file  Tobacco Use  . Smoking status: Never Smoker  . Smokeless tobacco: Never Used  Substance and Sexual Activity  . Alcohol use: No    Alcohol/week: 0.0 standard drinks  . Drug use: No  . Sexual activity: Yes  Lifestyle  . Physical activity:    Days per week: Not on file    Minutes per session: Not on file  . Stress: Not on file  Relationships  . Social connections:    Talks on phone: Not on file    Gets together: Not on file    Attends religious service: Not on file    Active member of club  or organization: Not on file    Attends meetings of clubs or organizations: Not on file    Relationship status: Not on file  Other Topics Concern  . Not on file  Social History Narrative  . Not on file   Allergies  Allergen Reactions  . Ampyra [Dalfampridine] Anaphylaxis and Shortness Of Breath    Chest pain, also   . Imitrex [Sumatriptan] Other (See Comments)    Chest tightness  . Crestor [Rosuvastatin Calcium] Other (See Comments)    Abdominal pain, shortness of breath  . Peanut-Containing Drug Products Itching    Itching in the mouth (remarked that she still eats this in limited amounts)  . Shrimp [Shellfish Allergy] Itching    Itching in the mouth (remarked that she still eats this in limited amounts)   Family History  Adopted: Yes    Current Outpatient Medications (Endocrine & Metabolic):    .  predniSONE (DELTASONE) 50 MG tablet, Take 1 tablet (50 mg total) by mouth daily for 7 days.  Current Outpatient Medications (Cardiovascular):  .  bisoprolol (ZEBETA) 5 MG tablet, Take 2.5 mg by mouth daily. Marland Kitchen  lisinopril (PRINIVIL,ZESTRIL) 40 MG tablet, Take 1 tablet (40 mg total) by mouth at bedtime. .  pravastatin (PRAVACHOL) 80 MG tablet, Take 1 tablet (80 mg total) by mouth daily. (Patient taking differently: Take 20 mg by mouth daily. )  Current Outpatient Medications (Respiratory):  .  albuterol (PROVENTIL HFA) 108 (90 Base) MCG/ACT inhaler, Inhale into the lungs every 6 (six) hours as needed.  .  budesonide-formoterol (SYMBICORT) 160-4.5 MCG/ACT inhaler, Inhale 2 puffs into the lungs 2 (two) times daily.  Current Outpatient Medications (Analgesics):  .  butalbital-acetaminophen-caffeine (FIORICET, ESGIC) 50-325-40 MG per tablet, Take 1 tablet by mouth 2 (two) times daily as needed for migraine.  .  naratriptan (AMERGE) 2.5 MG tablet, Take 2.5 mg by mouth as needed.    Current Outpatient Medications (Other):  .  amphetamine-dextroamphetamine (ADDERALL XR) 10 MG 24 hr capsule, Take 10 mg by mouth as needed.  .  Calcium-Magnesium-Vitamin D (CALCIUM 1200+D3 PO), Take 1 tablet by mouth 2 (two) times daily. .  cephALEXin (KEFLEX) 250 MG capsule, cephalexin 250 mg capsule  TAKE 1 CAPSULE BY MOUTH ONCE DAILY AT BEDTIME .  Cholecalciferol (VITAMIN D3 SUPER STRENGTH) 2000 units TABS, Take 4,000 Units by mouth. .  DULoxetine (CYMBALTA) 30 MG capsule, Take 60 mg by mouth daily.  Marland Kitchen  gabapentin (NEURONTIN) 800 MG tablet, Take 1 tablet (800 mg total) by mouth at bedtime. (per neurologist) .  Vitamin D, Ergocalciferol, (DRISDOL) 1.25 MG (50000 UT) CAPS capsule, Take 1 capsule (50,000 Units total) by mouth every 7 (seven) days. Marland Kitchen  zolpidem (AMBIEN) 5 MG tablet, Take 5 mg by mouth at bedtime.  Marland Kitchen  zonisamide (ZONEGRAN) 100 MG capsule, Take 200 mg by mouth at bedtime.    Past medical history,  social, surgical and family history all reviewed in electronic medical record.  No pertanent information unless stated regarding to the chief complaint.   Review of Systems:  No headache, visual changes, nausea, vomiting, diarrhea, constipation, dizziness, abdominal pain, skin rash, fevers, chills, night sweats, weight loss, swollen lymph nodes, , chest pain, shortness of breath, mood changes.  Muscle aches, joint swelling, body aches  Objective  Blood pressure 110/70, pulse 82, height 5\' 7"  (1.702 m), weight 154 lb (69.9 kg), SpO2 97 %.    General: No apparent distress alert and oriented x3 mood and affect normal, dressed appropriately.  HEENT:  Pupils equal, extraocular movements intact  Respiratory: Patient's speak in full sentences and does not appear short of breath  Cardiovascular: No lower extremity edema, non tender, no erythema  Skin: Warm dry intact with no signs of infection or rash on extremities or on axial skeleton.  Abdomen: Soft nontender  Neuro: Cranial nerves II through XII are intact, neurovascularly intact in all extremities with 2+ DTRs and 2+ pulses.  Lymph: No lymphadenopathy of posterior or anterior cervical chain or axillae bilaterally.  Gait antalgic MSK:  Non tender with full range of motion and good stability and symmetric strength and tone of shoulders, elbows, wrist, hip, and ankles bilaterally.  Knee: Left Normal to inspection with no erythema or effusion or obvious bony abnormalities. Tender to palpation more in the superior patellar area ROM lacks last 2 degrees of flexion of the last 2 degrees of extension Ligaments with solid consistent endpoints including ACL, PCL, LCL, MCL. Negative Mcmurray's, Apley's, and Thessalonian tests. painful patellar compression. Patellar glide with moderate crepitus. Patellar and quadriceps tendons unremarkable. Hamstring and quadriceps strength is normal.  Left ankle shows good range of motion the patient does have pes  planus with overpronation of the hind foot.  Mild tenderness over the lateral malleolus still noted.  Possible trace effusion noted.  Near full range of motion though.  Limited musculoskeletal ultrasound was performed and interpreted by Lyndal Pulley  Patient's lateral malleolus does have a callus formation starting at this time.  Patient though does have what appears to be a very small lateral joint effusion noted. Impression: Lateral malleolus improvement noted.    Impression and Recommendations:     This case required medical decision making of moderate complexity. The above documentation has been reviewed and is accurate and complete Lyndal Pulley, DO       Note: This dictation was prepared with Dragon dictation along with smaller phrase technology. Any transcriptional errors that result from this process are unintentional.

## 2018-11-06 ENCOUNTER — Ambulatory Visit: Payer: BLUE CROSS/BLUE SHIELD | Admitting: Family Medicine

## 2018-11-06 ENCOUNTER — Encounter: Payer: Self-pay | Admitting: Family Medicine

## 2018-11-06 ENCOUNTER — Ambulatory Visit: Payer: Self-pay

## 2018-11-06 ENCOUNTER — Other Ambulatory Visit: Payer: Self-pay | Admitting: Family Medicine

## 2018-11-06 VITALS — BP 110/70 | HR 82 | Ht 67.0 in | Wt 154.0 lb

## 2018-11-06 DIAGNOSIS — G8929 Other chronic pain: Secondary | ICD-10-CM

## 2018-11-06 DIAGNOSIS — M25572 Pain in left ankle and joints of left foot: Secondary | ICD-10-CM

## 2018-11-06 DIAGNOSIS — G35 Multiple sclerosis: Secondary | ICD-10-CM

## 2018-11-06 DIAGNOSIS — S8262XA Displaced fracture of lateral malleolus of left fibula, initial encounter for closed fracture: Secondary | ICD-10-CM | POA: Diagnosis not present

## 2018-11-06 DIAGNOSIS — S83002A Unspecified subluxation of left patella, initial encounter: Secondary | ICD-10-CM

## 2018-11-06 DIAGNOSIS — M25562 Pain in left knee: Secondary | ICD-10-CM

## 2018-11-06 MED ORDER — PREDNISONE 50 MG PO TABS: 50.0000 mg | ORAL_TABLET | Freq: Every day | ORAL | 0 refills | Status: AC

## 2018-11-06 NOTE — Assessment & Plan Note (Signed)
Patient concerned of possibly some weakness of the extremities.  Nothing significantly noted today.  Prednisone given though just in case.  Patient will follow-up with her neurologist as well.  Encourage patient only take medicine if feeling like she was losing significantly strides or weakness.

## 2018-11-06 NOTE — Assessment & Plan Note (Signed)
Improvement noted on ultrasound.  Continue the Aircast with activity for another month.  We discussed range of motion exercises, icing regimen, which activities to do which wants to avoid.  Patient will follow-up again in 4 to 6 weeks

## 2018-11-06 NOTE — Patient Instructions (Addendum)
Good to see you  You are doing great  PT will be calling you but you can call as well  Ice I your friend Stay active Prednisone daily for 7 days  Continue the ankle brace out of the house another week or 2  The knee is healing and will be slow but ready for PT, still wear brace as well out of the house another month Continue the vitamin D  See me again in 4 weeks

## 2018-11-06 NOTE — Assessment & Plan Note (Signed)
Improvement but very slow.  Discussed with patient that this may take time.  Continue with the brace and restart formal physical therapy.  Patient does have weakness overall secondary to her other comorbidities and will continue to monitor.  Follow-up again in 4 to 6 weeks

## 2018-11-28 DIAGNOSIS — G35 Multiple sclerosis: Secondary | ICD-10-CM | POA: Diagnosis not present

## 2018-11-28 DIAGNOSIS — R269 Unspecified abnormalities of gait and mobility: Secondary | ICD-10-CM | POA: Diagnosis not present

## 2018-11-28 DIAGNOSIS — R5382 Chronic fatigue, unspecified: Secondary | ICD-10-CM | POA: Insufficient documentation

## 2018-11-28 DIAGNOSIS — G43009 Migraine without aura, not intractable, without status migrainosus: Secondary | ICD-10-CM | POA: Diagnosis not present

## 2018-11-28 DIAGNOSIS — F5101 Primary insomnia: Secondary | ICD-10-CM | POA: Diagnosis not present

## 2018-11-28 DIAGNOSIS — R202 Paresthesia of skin: Secondary | ICD-10-CM | POA: Diagnosis not present

## 2018-12-02 NOTE — Progress Notes (Deleted)
[name] [place], [place] [address] Phone: [phone] Subjective:    I'm seeing this patient by the request  of:    CC:   [id number]  Cynthia Nichols is a 58 y.o. female coming in with complaint of ***  Onset-  Location Duration-  Character- Aggravating factors- Reliving factors-  Therapies tried-  Severity-     Past Medical History:  Diagnosis Date  . Adopted    Patient has no known family history  . Asthma   . Headache(784.0)   . 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 Hypertension   . IBS (irritable bowel syndrome)   . MS (multiple sclerosis) ([place])   . PONV (postoperative nausea and vomiting)   . Venous aneurysm    Right side of neck   Past Surgical History:  Procedure Laterality Date  . ABDOMINAL HYSTERECTOMY    . BREAST MASS EXCISION     Bilateral breast mass excision ( [facility])  . ORIF HUMERUS FRACTURE Right 05/17/2017  . TENNIS ELBOW RELEASE/NIRSCHEL PROCEDURE Right 05/17/2017   Procedure: EXPLORATION AND EXPOSURE OF RADIAL NERVE RIGHT ARM;  Surgeon: Roseanne Kaufman, MD;  Location: [facility];  Service: Orthopedics;  Laterality: Right;   Social History   Socioeconomic History  . Marital status: Married    Spouse name: Not on file  . Number of children: Not on file  . Years of education: Not on file  . Highest education level: Not on file  Occupational History  . Not on file  Social Needs  . Financial resource strain: Not on file  . Food insecurity:    Worry: Not on file    Inability: Not on file  . Transportation needs:    Medical: Not on file    Non-medical: Not on file  Tobacco Use  . Smoking status: Never Smoker  . Smokeless tobacco: Never Used  Substance and Sexual Activity  . Alcohol use: No    Alcohol/week: 0.0 standard drinks  . Drug use: No  . Sexual activity: Yes  Lifestyle  . Physical activity:    Days per week: Not on file    Minutes per session: Not on file  .  Stress: Not on file  Relationships  . Social connections:    Talks on phone: Not on file    Gets together: Not on file    Attends religious service: Not on file    Active member of club or organization: Not on file    Attends meetings of clubs or organizations: Not on file    Relationship status: Not on file  Other Topics Concern  . Not on file  Social History Narrative  . Not on file   Allergies  Allergen Reactions  . Ampyra [Dalfampridine] Anaphylaxis and Shortness Of Breath    Chest pain, also   . Imitrex [Sumatriptan] Other (See Comments)    Chest tightness  . Crestor [Rosuvastatin Calcium] Other (See Comments)    Abdominal pain, shortness of breath  . Peanut-Containing Drug Products Itching    Itching in the mouth (remarked that she still eats this in limited amounts)  . Shrimp [Shellfish Allergy] Itching    Itching in the mouth (remarked that she still eats this in limited amounts)   Family History  Adopted: Yes     Current Outpatient Medications (Cardiovascular):  .  bisoprolol (ZEBETA) 5 MG tablet, Take 2.5 mg by mouth daily. Marland Kitchen  lisinopril (PRINIVIL,ZESTRIL) 40  MG tablet, TAKE 1 TABLET BY MOUTH AT BEDTIME. .  pravastatin (PRAVACHOL) 80 MG tablet, Take 1 tablet (80 mg total) by mouth daily. (Patient taking differently: Take 20 mg by mouth daily. )  Current Outpatient Medications (Respiratory):  .  albuterol (PROVENTIL HFA) 108 (90 Base) MCG/ACT inhaler, Inhale into the lungs every 6 (six) hours as needed.  .  budesonide-formoterol (SYMBICORT) 160-4.5 MCG/ACT inhaler, Inhale 2 puffs into the lungs 2 (two) times daily.  Current Outpatient Medications (Analgesics):  .  butalbital-acetaminophen-caffeine (FIORICET, ESGIC) 50-325-40 MG per tablet, Take 1 tablet by mouth 2 (two) times daily as needed for migraine.  .  naratriptan (AMERGE) 2.5 MG tablet, Take 2.5 mg by mouth as needed.    Current Outpatient Medications (Other):  .  amphetamine-dextroamphetamine (ADDERALL  XR) 10 MG 24 hr capsule, Take 10 mg by mouth as needed.  .  Calcium-Magnesium-Vitamin D (CALCIUM 1200+D3 PO), Take 1 tablet by mouth 2 (two) times daily. .  cephALEXin (KEFLEX) 250 MG capsule, cephalexin 250 mg capsule  TAKE 1 CAPSULE BY MOUTH ONCE DAILY AT BEDTIME .  Cholecalciferol (VITAMIN D3 SUPER STRENGTH) 2000 units TABS, Take 4,000 Units by mouth. .  DULoxetine (CYMBALTA) 30 MG capsule, Take 60 mg by mouth daily.  Marland Kitchen  gabapentin (NEURONTIN) 800 MG tablet, Take 1 tablet (800 mg total) by mouth at bedtime. (per neurologist) .  Vitamin D, Ergocalciferol, (DRISDOL) 1.25 MG (50000 UT) CAPS capsule, Take 1 capsule (50,000 Units total) by mouth every 7 (seven) days. Marland Kitchen  zolpidem (AMBIEN) 5 MG tablet, Take 5 mg by mouth at bedtime.  Marland Kitchen  zonisamide (ZONEGRAN) 100 MG capsule, Take 200 mg by mouth at bedtime.    Past medical history, social, surgical and family history all reviewed in electronic medical record.  No pertanent information unless stated regarding to the chief complaint.   Review of Systems:  No headache, visual changes, nausea, vomiting, diarrhea, constipation, dizziness, abdominal pain, skin rash, fevers, chills, night sweats, weight loss, swollen lymph nodes, body aches, joint swelling, muscle aches, chest pain, shortness of breath, mood changes.   Objective  There were no vitals taken for this visit. Systems examined below as of    General: No apparent distress alert and oriented x3 mood and affect normal, dressed appropriately.  HEENT: Pupils equal, extraocular movements intact  Respiratory: Patient's speak in full sentences and does not appear short of breath  Cardiovascular: No lower extremity edema, non tender, no erythema  Skin: Warm dry intact with no signs of infection or rash on extremities or on axial skeleton.  Abdomen: Soft nontender  Neuro: Cranial nerves II through XII are intact, neurovascularly intact in all extremities with 2+ DTRs and 2+ pulses.  Lymph: No  lymphadenopathy of posterior or anterior cervical chain or axillae bilaterally.  Gait normal with good balance and coordination.  MSK:  Non tender with full range of motion and good stability and symmetric strength and tone of shoulders, elbows, wrist, hip, knee and ankles bilaterally.     Impression and Recommendations:     This case required medical decision making of moderate complexity. The above documentation has been reviewed and is accurate and complete Cynthia Pulley, DO       Note: This dictation was prepared with Dragon dictation along with smaller phrase technology. Any transcriptional errors that result from this process are unintentional.

## 2018-12-04 ENCOUNTER — Ambulatory Visit: Payer: Self-pay | Admitting: Family Medicine

## 2018-12-06 DIAGNOSIS — G35 Multiple sclerosis: Secondary | ICD-10-CM | POA: Diagnosis not present

## 2018-12-06 DIAGNOSIS — Z79899 Other long term (current) drug therapy: Secondary | ICD-10-CM | POA: Diagnosis not present

## 2018-12-14 DIAGNOSIS — R151 Fecal smearing: Secondary | ICD-10-CM | POA: Diagnosis not present

## 2018-12-14 DIAGNOSIS — N3946 Mixed incontinence: Secondary | ICD-10-CM | POA: Diagnosis not present

## 2018-12-14 DIAGNOSIS — N390 Urinary tract infection, site not specified: Secondary | ICD-10-CM | POA: Diagnosis not present

## 2018-12-17 NOTE — Progress Notes (Deleted)
[name] [place], [place] [address] Phone: [phone] Subjective:    I'm seeing this patient by the request  of:    CC:   [id number]  Cynthia Nichols is a 58 y.o. female coming in with complaint of ***  Onset-  Location Duration-  Character- Aggravating factors- Reliving factors-  Therapies tried-  Severity-     Past Medical History:  Diagnosis Date  . Adopted    Patient has no known family history  . Asthma   . Headache(784.0)   . 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 Hypertension   . IBS (irritable bowel syndrome)   . MS (multiple sclerosis) ([place])   . PONV (postoperative nausea and vomiting)   . Venous aneurysm    Right side of neck   Past Surgical History:  Procedure Laterality Date  . ABDOMINAL HYSTERECTOMY    . BREAST MASS EXCISION     Bilateral breast mass excision ( Benign)  . ORIF HUMERUS FRACTURE Right 05/17/2017  . TENNIS ELBOW RELEASE/NIRSCHEL PROCEDURE Right 05/17/2017   Procedure: EXPLORATION AND EXPOSURE OF RADIAL NERVE RIGHT ARM;  Surgeon: Roseanne Kaufman, MD;  Location: [facility];  Service: Orthopedics;  Laterality: Right;   Social History   Socioeconomic History  . Marital status: Married    Spouse name: Not on file  . Number of children: Not on file  . Years of education: Not on file  . Highest education level: Not on file  Occupational History  . Not on file  Social Needs  . Financial resource strain: Not on file  . Food insecurity:    Worry: Not on file    Inability: Not on file  . Transportation needs:    Medical: Not on file    Non-medical: Not on file  Tobacco Use  . Smoking status: Never Smoker  . Smokeless tobacco: Never Used  Substance and Sexual Activity  . Alcohol use: No    Alcohol/week: 0.0 standard drinks  . Drug use: No  . Sexual activity: Yes  Lifestyle  . Physical activity:    Days per week: Not on file    Minutes per session: Not on file  .  Stress: Not on file  Relationships  . Social connections:    Talks on phone: Not on file    Gets together: Not on file    Attends religious service: Not on file    Active member of club or organization: Not on file    Attends meetings of clubs or organizations: Not on file    Relationship status: Not on file  Other Topics Concern  . Not on file  Social History Narrative  . Not on file   Allergies  Allergen Reactions  . Ampyra [Dalfampridine] Anaphylaxis and Shortness Of Breath    Chest pain, also   . Imitrex [Sumatriptan] Other (See Comments)    Chest tightness  . Crestor [Rosuvastatin Calcium] Other (See Comments)    Abdominal pain, shortness of breath  . Peanut-Containing Drug Products Itching    Itching in the mouth (remarked that she still eats this in limited amounts)  . Shrimp [Shellfish Allergy] Itching    Itching in the mouth (remarked that she still eats this in limited amounts)   Family History  Adopted: Yes     Current Outpatient Medications (Cardiovascular):  .  bisoprolol (ZEBETA) 5 MG tablet, Take 2.5 mg by mouth daily. Marland Kitchen  lisinopril (PRINIVIL,ZESTRIL) 40  MG tablet, TAKE 1 TABLET BY MOUTH AT BEDTIME. .  pravastatin (PRAVACHOL) 80 MG tablet, Take 1 tablet (80 mg total) by mouth daily. (Patient taking differently: Take 20 mg by mouth daily. )  Current Outpatient Medications (Respiratory):  .  albuterol (PROVENTIL HFA) 108 (90 Base) MCG/ACT inhaler, Inhale into the lungs every 6 (six) hours as needed.  .  budesonide-formoterol (SYMBICORT) 160-4.5 MCG/ACT inhaler, Inhale 2 puffs into the lungs 2 (two) times daily.  Current Outpatient Medications (Analgesics):  .  butalbital-acetaminophen-caffeine (FIORICET, ESGIC) 50-325-40 MG per tablet, Take 1 tablet by mouth 2 (two) times daily as needed for migraine.  .  naratriptan (AMERGE) 2.5 MG tablet, Take 2.5 mg by mouth as needed.    Current Outpatient Medications (Other):  .  amphetamine-dextroamphetamine (ADDERALL  XR) 10 MG 24 hr capsule, Take 10 mg by mouth as needed.  .  Calcium-Magnesium-Vitamin D (CALCIUM 1200+D3 PO), Take 1 tablet by mouth 2 (two) times daily. .  cephALEXin (KEFLEX) 250 MG capsule, cephalexin 250 mg capsule  TAKE 1 CAPSULE BY MOUTH ONCE DAILY AT BEDTIME .  Cholecalciferol (VITAMIN D3 SUPER STRENGTH) 2000 units TABS, Take 4,000 Units by mouth. .  DULoxetine (CYMBALTA) 30 MG capsule, Take 60 mg by mouth daily.  Marland Kitchen  gabapentin (NEURONTIN) 800 MG tablet, Take 1 tablet (800 mg total) by mouth at bedtime. (per neurologist) .  Vitamin D, Ergocalciferol, (DRISDOL) 1.25 MG (50000 UT) CAPS capsule, Take 1 capsule (50,000 Units total) by mouth every 7 (seven) days. Marland Kitchen  zolpidem (AMBIEN) 5 MG tablet, Take 5 mg by mouth at bedtime.  Marland Kitchen  zonisamide (ZONEGRAN) 100 MG capsule, Take 200 mg by mouth at bedtime.    Past medical history, social, surgical and family history all reviewed in electronic medical record.  No pertanent information unless stated regarding to the chief complaint.   Review of Systems:  No headache, visual changes, nausea, vomiting, diarrhea, constipation, dizziness, abdominal pain, skin rash, fevers, chills, night sweats, weight loss, swollen lymph nodes, body aches, joint swelling, muscle aches, chest pain, shortness of breath, mood changes.   Objective  There were no vitals taken for this visit. Systems examined below as of    General: No apparent distress alert and oriented x3 mood and affect normal, dressed appropriately.  HEENT: Pupils equal, extraocular movements intact  Respiratory: Patient's speak in full sentences and does not appear short of breath  Cardiovascular: No lower extremity edema, non tender, no erythema  Skin: Warm dry intact with no signs of infection or rash on extremities or on axial skeleton.  Abdomen: Soft nontender  Neuro: Cranial nerves II through XII are intact, neurovascularly intact in all extremities with 2+ DTRs and 2+ pulses.  Lymph: No  lymphadenopathy of posterior or anterior cervical chain or axillae bilaterally.  Gait normal with good balance and coordination.  MSK:  Non tender with full range of motion and good stability and symmetric strength and tone of shoulders, elbows, wrist, hip, knee and ankles bilaterally.     Impression and Recommendations:     This case required medical decision making of moderate complexity. The above documentation has been reviewed and is accurate and complete Cynthia Pulley, DO       Note: This dictation was prepared with Dragon dictation along with smaller phrase technology. Any transcriptional errors that result from this process are unintentional.

## 2018-12-18 ENCOUNTER — Ambulatory Visit: Payer: Self-pay | Admitting: Family Medicine

## 2019-01-05 ENCOUNTER — Ambulatory Visit: Payer: BLUE CROSS/BLUE SHIELD | Admitting: Family Medicine

## 2019-01-20 ENCOUNTER — Encounter: Payer: Self-pay | Admitting: Family Medicine

## 2019-02-08 ENCOUNTER — Other Ambulatory Visit: Payer: Self-pay

## 2019-02-08 ENCOUNTER — Ambulatory Visit (INDEPENDENT_AMBULATORY_CARE_PROVIDER_SITE_OTHER): Payer: BLUE CROSS/BLUE SHIELD | Admitting: Nurse Practitioner

## 2019-02-08 ENCOUNTER — Encounter: Payer: Self-pay | Admitting: Nurse Practitioner

## 2019-02-08 ENCOUNTER — Other Ambulatory Visit: Payer: Self-pay | Admitting: Family Medicine

## 2019-02-08 VITALS — Temp 96.9°F | Ht 67.0 in | Wt 151.0 lb

## 2019-02-08 DIAGNOSIS — I10 Essential (primary) hypertension: Secondary | ICD-10-CM

## 2019-02-08 DIAGNOSIS — E559 Vitamin D deficiency, unspecified: Secondary | ICD-10-CM

## 2019-02-08 DIAGNOSIS — E782 Mixed hyperlipidemia: Secondary | ICD-10-CM

## 2019-02-08 DIAGNOSIS — I700 Atherosclerosis of aorta: Secondary | ICD-10-CM

## 2019-02-08 DIAGNOSIS — K589 Irritable bowel syndrome without diarrhea: Secondary | ICD-10-CM | POA: Diagnosis not present

## 2019-02-08 DIAGNOSIS — G35 Multiple sclerosis: Secondary | ICD-10-CM | POA: Diagnosis not present

## 2019-02-08 DIAGNOSIS — N390 Urinary tract infection, site not specified: Secondary | ICD-10-CM

## 2019-02-08 DIAGNOSIS — G4700 Insomnia, unspecified: Secondary | ICD-10-CM

## 2019-02-08 DIAGNOSIS — G43111 Migraine with aura, intractable, with status migrainosus: Secondary | ICD-10-CM

## 2019-02-08 MED ORDER — NITROFURANTOIN MONOHYD MACRO 100 MG PO CAPS: ORAL_CAPSULE | ORAL | 0 refills | Status: DC

## 2019-02-08 MED ORDER — LISINOPRIL 40 MG PO TABS: 40.0000 mg | ORAL_TABLET | Freq: Every day | ORAL | 1 refills | Status: DC

## 2019-02-08 MED ORDER — BISOPROLOL FUMARATE 5 MG PO TABS: 2.5000 mg | ORAL_TABLET | Freq: Every day | ORAL | 1 refills | Status: DC

## 2019-02-08 NOTE — Progress Notes (Signed)
Virtual Visit via Video Note  I connected with Cynthia Nichols  on 02/08/19 at 11:20 AM EDT by a video enabled telemedicine application and verified that I am speaking with the correct person using two identifiers.   Staff discussed the limitations of evaluation and management by telemedicine and the availability of in person appointments. The patient expressed understanding and agreed to proceed.  Patient location: home  My location: home office Other people present:  none HPI  Hypertension Lisinopril 40mg  daily, bisoprolol 2.5mg  daily States lot less stress since being off work Low salt diet Denies blurry vision, chest pain, dizziness.  BP Readings from Last 3 Encounters:  11/06/18 110/70  10/17/18 110/70  09/29/18 118/70   [facility]- Zonisamide 100mg  for migraine prevention and has been working well, amerge 2.5mg  PRN and ubrelvy PRN for abortive therapy.   Insomnia Takes ambien every night- states gets 4-5 hours of sleep, does not feel refreshed in hte mornings  Hyperlipidemia Pravastatin 20 mg is prescribed pravastatin 80mg  but states she felt that was a high dose so she has been cutting her tablets in quarters.   Multiple sclerosis Follows up with neurology. First symptom onset was in 2011 has not had any relapses since diagnosis. Does have some right leg weakness.  PHQ2/9: Depression screen [facility] 2/9 02/08/2019 09/29/2018 11/29/2017  Decreased Interest 0 0 0  Down, Depressed, Hopeless 0 0 0  PHQ - 2 Score 0 0 0  Altered sleeping 0 0 -  Tired, decreased energy 0 0 -  Change in appetite 0 0 -  Feeling bad or failure about yourself  0 0 -  Trouble concentrating 0 0 -  Moving slowly or fidgety/restless 0 0 -  Suicidal thoughts 0 0 -  PHQ-9 Score 0 0 -  Difficult doing work/chores Not difficult at all Not difficult at all -    PHQ reviewed. Negative  Patient Active Problem List   Diagnosis Date Noted  . Closed low lateral malleolus fracture, left,  initial encounter 10/17/2018  . Patellar subluxation, left, initial encounter 08/14/2018  . Migraine without aura and without status migrainosus, not intractable 02/09/2018  . 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 Aortic atherosclerosis ([place]) 11/29/2017  . Medication monitoring encounter 11/29/2017  . Osteoporosis 11/29/2017  . Essential hypertension, benign 11/29/2017  . Pulmonary nodules 10/06/2017  . 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 Humerus shaft fracture 05/17/2017  . Closed fracture of shaft of right humerus 05/13/2017  . Other fracture of shaft of right humerus, initial encounter for closed fracture 05/13/2017  . Contusion of elbow and forearm, initial encounter 08/26/2016  . 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  . Nonallopathic lesion of sacral region 10/23/2015  . Nonallopathic lesion of pelvic region 10/23/2015  . Nonallopathic lesion of lumbosacral region 10/23/2015  . Fracture of fifth metatarsal bone 08/13/2015  . Patella fracture 07/03/2015  . Paresthesia 06/19/2015  . Palpitations 06/23/2012  . Mixed hyperlipidemia 06/23/2012  . Venous aneurysm   . IBS (irritable bowel syndrome)   . Multiple sclerosis, relapsing-remitting ([place]) 12/14/2011  . Unspecified venous (peripheral) insufficiency 11/09/2011  . 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 Leg pain 10/26/2011    Past Medical History:  Diagnosis Date  . Adopted    Patient has no known family history  . Asthma   . Headache(784.0)   . 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 Hypertension   . IBS (  irritable bowel syndrome)   . MS (multiple sclerosis) ([place])   . PONV (postoperative nausea and vomiting)   . Venous aneurysm    Right side of neck    Past Surgical History:  Procedure Laterality Date  . ABDOMINAL HYSTERECTOMY    . BREAST MASS EXCISION     Bilateral breast mass excision ( Benign)  . ORIF HUMERUS FRACTURE Right  05/17/2017  . TENNIS ELBOW RELEASE/NIRSCHEL PROCEDURE Right 05/17/2017   Procedure: EXPLORATION AND EXPOSURE OF RADIAL NERVE RIGHT ARM;  Surgeon: Roseanne Kaufman, MD;  Location: [facility];  Service: Orthopedics;  Laterality: Right;    Social History   Tobacco Use  . Smoking status: Never Smoker  . Smokeless tobacco: Never Used  Substance Use Topics  . Alcohol use: No    Alcohol/week: 0.0 standard drinks     Current Outpatient Medications:  .  albuterol (PROVENTIL HFA) 108 (90 Base) MCG/ACT inhaler, Inhale into the lungs every 6 (six) hours as needed. , Disp: , Rfl:  .  amphetamine-dextroamphetamine (ADDERALL XR) 10 MG 24 hr capsule, Take 10 mg by mouth as needed. , Disp: , Rfl:  .  bisoprolol (ZEBETA) 5 MG tablet, Take 2.5 mg by mouth daily., Disp: , Rfl:  .  budesonide-formoterol (SYMBICORT) 160-4.5 MCG/ACT inhaler, Inhale 2 puffs into the lungs 2 (two) times daily., Disp: , Rfl:  .  butalbital-acetaminophen-caffeine (FIORICET, ESGIC) 50-325-40 MG per tablet, Take 1 tablet by mouth 2 (two) times daily as needed for migraine. , Disp: , Rfl:  .  Calcium-Magnesium-Vitamin D (CALCIUM 1200+D3 PO), Take 1 tablet by mouth 2 (two) times daily., Disp: , Rfl:  .  cephALEXin (KEFLEX) 250 MG capsule, cephalexin 250 mg capsule  TAKE 1 CAPSULE BY MOUTH ONCE DAILY AT BEDTIME, Disp: , Rfl:  .  Cholecalciferol (VITAMIN D3 SUPER STRENGTH) 2000 units TABS, Take 4,000 Units by mouth., Disp: , Rfl:  .  DULoxetine (CYMBALTA) 30 MG capsule, Take 60 mg by mouth daily. , Disp: , Rfl:  .  gabapentin (NEURONTIN) 800 MG tablet, Take 1 tablet (800 mg total) by mouth at bedtime. (per neurologist), Disp: , Rfl:  .  lisinopril (PRINIVIL,ZESTRIL) 40 MG tablet, TAKE 1 TABLET BY MOUTH AT BEDTIME., Disp: 30 tablet, Rfl: 2 .  naratriptan (AMERGE) 2.5 MG tablet, Take 2.5 mg by mouth as needed. , Disp: , Rfl:  .  pravastatin (PRAVACHOL) 80 MG tablet, Take 1 tablet (80 mg total) by mouth daily. (Patient taking differently: Take 20  mg by mouth daily. ), Disp: 90 tablet, Rfl: 1 .  Vitamin D, Ergocalciferol, (DRISDOL) 1.25 MG (50000 UT) CAPS capsule, Take 1 capsule (50,000 Units total) by mouth every 7 (seven) days., Disp: 12 capsule, Rfl: 0 .  zolpidem (AMBIEN) 5 MG tablet, Take 5 mg by mouth at bedtime. , Disp: , Rfl:  .  zonisamide (ZONEGRAN) 100 MG capsule, Take 200 mg by mouth at bedtime., Disp: , Rfl:   Allergies  Allergen Reactions  . Ampyra [Dalfampridine] Anaphylaxis and Shortness Of Breath    Chest pain, also   . Imitrex [Sumatriptan] Other (See Comments)    Chest tightness  . Crestor [Rosuvastatin Calcium] Other (See Comments)    Abdominal pain, shortness of breath  . Peanut-Containing Drug Products Itching    Itching in the mouth (remarked that she still eats this in limited amounts)  . Shrimp [Shellfish Allergy] Itching    Itching in the mouth (remarked that she still eats this in limited amounts)    ROS  No other specific complaints in a complete review of systems (except as listed in HPI above).  Objective  Vitals:   02/08/19 1050  Temp: (!) 96.9 F (36.1 C)  TempSrc: Oral  Weight: 151 lb (68.5 kg)  Height: 5\' 7"  (1.702 m)    Body mass index is 23.65 kg/m.  Nursing Note and Vital Signs reviewed.  Physical Exam  Constitutional: Patient appears well-developed and well-nourished. No distress.  HENT: Head: Normocephalic and atraumatic. Pulmonary/Chest: Effort normal   Neurological: alert and oriented, speech normal.  Psychiatric: Patient has a normal mood and affect. behavior is normal. Judgment and thought content normal.    Assessment & Plan  1. Essential hypertension, benign Asymptomatic, continue dose as it has been stable for awhile.  - lisinopril (ZESTRIL) 40 MG tablet; Take 1 tablet (40 mg total) by mouth at bedtime.  Dispense: 3090 tablet; Refill: 1 - bisoprolol (ZEBETA) 5 MG tablet; Take 0.5 tablets (2.5 mg total) by mouth daily.  Dispense: 45 tablet; Refill: 1 -  COMPLETE METABOLIC PANEL WITH GFR  2. Aortic atherosclerosis (HCC) Continue statin, will likely increase dose.  - Lipid Profile  3. Irritable bowel syndrome, unspecified type Controlled.   4. Multiple sclerosis, relapsing-remitting (HCC) Stable, follows up with neurology  5. Mixed hyperlipidemia Will check levels, discussed goal of under 70 due to atherosclerosis.  - Lipid Profile  6. Vitamin D deficiency Supplementation  - Vitamin D (25 hydroxy)  7. Frequent UTI Discussed need to follow-up with urology for future refills  - nitrofurantoin, macrocrystal-monohydrate, (MACROBID) 100 MG capsule; Take one tablet after sexual intercourse.  Dispense: 10 capsule; Refill: 0  8. Intractable migraine with aura with status migrainosus Improving , follows up with neurology  9. Insomnia, unspecified type Stable, follows up with neurology    Follow Up Instructions:   3 months   I discussed the assessment and treatment plan with the patient. The patient was provided an opportunity to ask questions and all were answered. The patient agreed with the plan and demonstrated an understanding of the instructions.   The patient was advised to call back or seek an in-person evaluation if the symptoms worsen or if the condition fails to improve as anticipated.  I provided 21 minutes of non-face-to-face time during this encounter through video and chart review.   Fredderick Severance, NP

## 2019-03-07 DIAGNOSIS — M79642 Pain in left hand: Secondary | ICD-10-CM | POA: Diagnosis not present

## 2019-03-07 DIAGNOSIS — M79641 Pain in right hand: Secondary | ICD-10-CM | POA: Diagnosis not present

## 2019-03-07 DIAGNOSIS — M79601 Pain in right arm: Secondary | ICD-10-CM | POA: Diagnosis not present

## 2019-03-07 DIAGNOSIS — R29898 Other symptoms and signs involving the musculoskeletal system: Secondary | ICD-10-CM | POA: Diagnosis not present

## 2019-03-12 DIAGNOSIS — G5622 Lesion of ulnar nerve, left upper limb: Secondary | ICD-10-CM | POA: Diagnosis not present

## 2019-03-12 DIAGNOSIS — G5602 Carpal tunnel syndrome, left upper limb: Secondary | ICD-10-CM | POA: Diagnosis not present

## 2019-03-21 DIAGNOSIS — M79642 Pain in left hand: Secondary | ICD-10-CM | POA: Diagnosis not present

## 2019-03-21 DIAGNOSIS — G5602 Carpal tunnel syndrome, left upper limb: Secondary | ICD-10-CM | POA: Diagnosis not present

## 2019-03-21 DIAGNOSIS — G5622 Lesion of ulnar nerve, left upper limb: Secondary | ICD-10-CM | POA: Diagnosis not present

## 2019-04-11 DIAGNOSIS — G35 Multiple sclerosis: Secondary | ICD-10-CM | POA: Diagnosis not present

## 2019-04-17 DIAGNOSIS — G5602 Carpal tunnel syndrome, left upper limb: Secondary | ICD-10-CM | POA: Diagnosis not present

## 2019-04-17 DIAGNOSIS — G5622 Lesion of ulnar nerve, left upper limb: Secondary | ICD-10-CM | POA: Diagnosis not present

## 2019-04-17 DIAGNOSIS — M62532 Muscle wasting and atrophy, not elsewhere classified, left forearm: Secondary | ICD-10-CM | POA: Diagnosis not present

## 2019-04-30 DIAGNOSIS — G5620 Lesion of ulnar nerve, unspecified upper limb: Secondary | ICD-10-CM | POA: Insufficient documentation

## 2019-04-30 DIAGNOSIS — G5602 Carpal tunnel syndrome, left upper limb: Secondary | ICD-10-CM | POA: Insufficient documentation

## 2019-04-30 DIAGNOSIS — M79642 Pain in left hand: Secondary | ICD-10-CM | POA: Diagnosis not present

## 2019-05-22 DIAGNOSIS — M79642 Pain in left hand: Secondary | ICD-10-CM | POA: Diagnosis not present

## 2019-06-04 DIAGNOSIS — L578 Other skin changes due to chronic exposure to nonionizing radiation: Secondary | ICD-10-CM | POA: Diagnosis not present

## 2019-06-04 DIAGNOSIS — D1801 Hemangioma of skin and subcutaneous tissue: Secondary | ICD-10-CM | POA: Diagnosis not present

## 2019-06-04 DIAGNOSIS — L820 Inflamed seborrheic keratosis: Secondary | ICD-10-CM | POA: Diagnosis not present

## 2019-06-04 DIAGNOSIS — L0390 Cellulitis, unspecified: Secondary | ICD-10-CM | POA: Diagnosis not present

## 2019-06-07 DIAGNOSIS — R262 Difficulty in walking, not elsewhere classified: Secondary | ICD-10-CM | POA: Diagnosis not present

## 2019-06-07 DIAGNOSIS — G35 Multiple sclerosis: Secondary | ICD-10-CM | POA: Diagnosis not present

## 2019-06-14 DIAGNOSIS — Z20828 Contact with and (suspected) exposure to other viral communicable diseases: Secondary | ICD-10-CM | POA: Diagnosis not present

## 2019-06-14 DIAGNOSIS — M50222 Other cervical disc displacement at C5-C6 level: Secondary | ICD-10-CM | POA: Diagnosis not present

## 2019-06-14 DIAGNOSIS — G35 Multiple sclerosis: Secondary | ICD-10-CM | POA: Diagnosis not present

## 2019-06-14 DIAGNOSIS — M4802 Spinal stenosis, cervical region: Secondary | ICD-10-CM | POA: Diagnosis not present

## 2019-06-18 DIAGNOSIS — G4700 Insomnia, unspecified: Secondary | ICD-10-CM | POA: Diagnosis not present

## 2019-06-18 DIAGNOSIS — Z01419 Encounter for gynecological examination (general) (routine) without abnormal findings: Secondary | ICD-10-CM | POA: Diagnosis not present

## 2019-06-18 DIAGNOSIS — Z6825 Body mass index (BMI) 25.0-25.9, adult: Secondary | ICD-10-CM | POA: Diagnosis not present

## 2019-06-18 DIAGNOSIS — E7800 Pure hypercholesterolemia, unspecified: Secondary | ICD-10-CM | POA: Diagnosis not present

## 2019-06-18 DIAGNOSIS — R5383 Other fatigue: Secondary | ICD-10-CM | POA: Diagnosis not present

## 2019-06-18 DIAGNOSIS — Z1231 Encounter for screening mammogram for malignant neoplasm of breast: Secondary | ICD-10-CM | POA: Diagnosis not present

## 2019-06-18 DIAGNOSIS — M810 Age-related osteoporosis without current pathological fracture: Secondary | ICD-10-CM | POA: Diagnosis not present

## 2019-06-18 DIAGNOSIS — E785 Hyperlipidemia, unspecified: Secondary | ICD-10-CM | POA: Diagnosis not present

## 2019-06-25 DIAGNOSIS — D1801 Hemangioma of skin and subcutaneous tissue: Secondary | ICD-10-CM | POA: Diagnosis not present

## 2019-07-04 DIAGNOSIS — M25532 Pain in left wrist: Secondary | ICD-10-CM | POA: Diagnosis not present

## 2019-07-12 DIAGNOSIS — R202 Paresthesia of skin: Secondary | ICD-10-CM | POA: Diagnosis not present

## 2019-07-12 DIAGNOSIS — G43019 Migraine without aura, intractable, without status migrainosus: Secondary | ICD-10-CM | POA: Diagnosis not present

## 2019-07-12 DIAGNOSIS — R269 Unspecified abnormalities of gait and mobility: Secondary | ICD-10-CM | POA: Diagnosis not present

## 2019-07-12 DIAGNOSIS — F5101 Primary insomnia: Secondary | ICD-10-CM | POA: Diagnosis not present

## 2019-07-20 DIAGNOSIS — G35 Multiple sclerosis: Secondary | ICD-10-CM | POA: Diagnosis not present

## 2019-07-20 DIAGNOSIS — Z0100 Encounter for examination of eyes and vision without abnormal findings: Secondary | ICD-10-CM | POA: Diagnosis not present

## 2019-08-09 ENCOUNTER — Telehealth: Payer: Self-pay

## 2019-08-09 DIAGNOSIS — I10 Essential (primary) hypertension: Secondary | ICD-10-CM

## 2019-08-10 MED ORDER — BISOPROLOL FUMARATE 5 MG PO TABS: 2.5000 mg | ORAL_TABLET | Freq: Every day | ORAL | 0 refills | Status: DC

## 2019-08-10 NOTE — Telephone Encounter (Signed)
Please schedule patient for follow up in the next 30 days.  

## 2019-08-13 NOTE — Telephone Encounter (Signed)
Called pt to schedule an appt and pt states she will have to call us back

## 2019-08-27 DIAGNOSIS — M79602 Pain in left arm: Secondary | ICD-10-CM | POA: Diagnosis not present

## 2019-08-27 DIAGNOSIS — G5602 Carpal tunnel syndrome, left upper limb: Secondary | ICD-10-CM | POA: Diagnosis not present

## 2019-08-27 DIAGNOSIS — G5622 Lesion of ulnar nerve, left upper limb: Secondary | ICD-10-CM | POA: Diagnosis not present

## 2019-10-17 ENCOUNTER — Other Ambulatory Visit: Payer: Self-pay | Admitting: Family Medicine

## 2019-10-17 DIAGNOSIS — I10 Essential (primary) hypertension: Secondary | ICD-10-CM

## 2019-10-17 NOTE — Telephone Encounter (Signed)
Patient needs follow-up appt for refills

## 2019-10-17 NOTE — Telephone Encounter (Signed)
Requested medication (s) are due for refill today:  Yes  Requested medication (s) are on the active medication list:   Yes  Future visit scheduled:   No       Last ordered: 08/10/2019  #30  0 refills   takes 1/2 tablet a day.     Requested Prescriptions  Pending Prescriptions Disp Refills   bisoprolol (ZEBETA) 5 MG tablet [Pharmacy Med Name: BISOPROLOL FUMARATE 5 MG TA 5 Tablet] 30 tablet 0    Sig: TAKE 1/2 TABLET (2.5 MG TOTAL) BY MOUTH DAILY      Cardiovascular:  Beta Blockers Failed - 10/17/2019  7:23 AM      Failed - Valid encounter within last 6 months    Recent Outpatient Visits           8 months ago Essential hypertension, benign   [place], Bethel Born, NP   1 year ago Neoplasm of uncertain behavior of lip   [place], Satira Anis, MD   1 year ago [place], Bethel Born, NP   1 year ago Osteoporosis, unspecified osteoporosis type, unspecified pathological fracture presence   [place], Bethel Born, NP   1 year ago Aortic atherosclerosis [place])   [place], Satira Anis, MD              Passed - Last BP in normal range    BP Readings from Last 1 Encounters:  11/06/18 110/70          Passed - Last Heart Rate in normal range    Pulse Readings from Last 1 Encounters:  11/06/18 82

## 2019-10-17 NOTE — Telephone Encounter (Signed)
lvm to Please schedule patient for follow up in the next 7 days

## 2019-10-17 NOTE — Telephone Encounter (Signed)
Please schedule patient for follow up in the next 7 days.

## 2019-11-02 ENCOUNTER — Encounter: Payer: Self-pay | Admitting: Adult Health

## 2019-11-02 ENCOUNTER — Ambulatory Visit (INDEPENDENT_AMBULATORY_CARE_PROVIDER_SITE_OTHER): Payer: PPO | Admitting: Adult Health

## 2019-11-02 ENCOUNTER — Ambulatory Visit (INDEPENDENT_AMBULATORY_CARE_PROVIDER_SITE_OTHER): Payer: PPO

## 2019-11-02 ENCOUNTER — Other Ambulatory Visit: Payer: Self-pay

## 2019-11-02 VITALS — BP 140/78 | HR 73 | Temp 97.7°F | Ht 67.0 in | Wt 172.4 lb

## 2019-11-02 DIAGNOSIS — R05 Cough: Secondary | ICD-10-CM

## 2019-11-02 DIAGNOSIS — J45909 Unspecified asthma, uncomplicated: Secondary | ICD-10-CM | POA: Insufficient documentation

## 2019-11-02 DIAGNOSIS — R059 Cough, unspecified: Secondary | ICD-10-CM

## 2019-11-02 DIAGNOSIS — J4520 Mild intermittent asthma, uncomplicated: Secondary | ICD-10-CM

## 2019-11-02 DIAGNOSIS — R918 Other nonspecific abnormal finding of lung field: Secondary | ICD-10-CM | POA: Diagnosis not present

## 2019-11-02 HISTORY — DX: Unspecified asthma, uncomplicated: J45.909

## 2019-11-02 MED ORDER — BENZONATATE 200 MG PO CAPS: 200.0000 mg | ORAL_CAPSULE | Freq: Three times a day (TID) | ORAL | 1 refills | Status: DC | PRN

## 2019-11-02 NOTE — Assessment & Plan Note (Signed)
Pulmonary nodules stable on CT chest 2018 and 2019 consistent with benign etiology.  Chest x-ray today shows no pulmonary nodules.  Shows mild apical scarring consistent with previous CT chest.

## 2019-11-02 NOTE — Progress Notes (Signed)
@Patient  ID: Cynthia Nichols, female    DOB: 11-16-1960, 59 y.o.   MRN: [id number]  Chief Complaint  Patient presents with  . Follow-up    Asthma     Referring provider: Arnetha Courser, MD  HPI: 59 year old female never smoker followed for abnormal chest x-ray with pulmonary nodules and asthma Medical history significant for MS  TEST/EVENTS :  12/ 2018 spirometry with PCP apparently showed a mixture of obstruction and restriction  09/2017 spirometry shows mild restriction with ratio 77, FEV1 72% FVC 74%  11/02/2019 Follow up : Pulmonary nodules, asthma Patient returns for a follow-up.  Last seen January 2019.  Patient says since last visit she has been doing okay breathing is doing about the same.  She says she does get some shortness of breath with activities intermittently.  She has had an ongoing cough over the last 6 weeks.  She had COVID-19 late December and says since then she has had an ongoing dry cough.  She denies any fever discolored mucus chest pain orthopnea PND or hemoptysis.  Has not used her albuterol inhaler.  Was previously told she may have some underlying asthma.  Spirometry in 2019 showed mild restriction with no airflow obstruction.  She has not been on Dulera in more than a year.  Patient is on lisinopril.   Patient was noted to have some pulmonary nodules.  A CT chest December 2018 showed patchy biapical opacities right greater than left compatible with mild granulomatous and action and some scattered pulmonary nodules largest 6 mm.  Serial CT chest July 2019 showed no change in biapical reticular nodular opacities compatible with scar versus old granulomatous disease.  No change in scattered pulmonary nodules. Patient is a never smoker.  These were felt to be consistent with a benign etiology.     Allergies  Allergen Reactions  . Ampyra [Dalfampridine] Anaphylaxis and Shortness Of Breath    Chest pain, also   . Imitrex [Sumatriptan] Other (See Comments)   Chest tightness  . Crestor [Rosuvastatin Calcium] Other (See Comments)    Abdominal pain, shortness of breath  . Peanut-Containing Drug Products Itching    Itching in the mouth (remarked that she still eats this in limited amounts)  . Shrimp [Shellfish Allergy] Itching    Itching in the mouth (remarked that she still eats this in limited amounts)    Immunization History  Administered Date(s) Administered  . Influenza,inj,Quad PF,6+ Mos 06/24/2017  . Influenza-Unspecified 06/20/2017    Past Medical History:  Diagnosis Date  . Adopted    Patient has no known family history  . Asthma   . Headache(784.0)   . 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 Hypertension   . IBS (irritable bowel syndrome)   . MS (multiple sclerosis) ([place])   . PONV (postoperative nausea and vomiting)   . Venous aneurysm    Right side of neck    Tobacco History: Social History   Tobacco Use  Smoking Status Never Smoker  Smokeless Tobacco Never Used   Counseling given: Not Answered   Outpatient Medications Prior to Visit  Medication Sig Dispense Refill  . albuterol (PROVENTIL HFA) 108 (90 Base) MCG/ACT inhaler Inhale into the lungs every 6 (six) hours as needed.     Marland Kitchen amphetamine-dextroamphetamine (ADDERALL XR) 10 MG 24 hr capsule Take 10 mg by mouth as needed.     . bisoprolol (ZEBETA) 5 MG tablet TAKE 1/2 TABLET (2.5 MG TOTAL) BY MOUTH DAILY  7 tablet 0  . butalbital-acetaminophen-caffeine (FIORICET, ESGIC) 50-325-40 MG per tablet Take 1 tablet by mouth 2 (two) times daily as needed for migraine.     . DULoxetine (CYMBALTA) 30 MG capsule Take 60 mg by mouth daily.     Marland Kitchen lisinopril (ZESTRIL) 40 MG tablet Take 1 tablet (40 mg total) by mouth at bedtime. 3090 tablet 1  . naratriptan (AMERGE) 2.5 MG tablet Take 2.5 mg by mouth as needed.     . pravastatin (PRAVACHOL) 80 MG tablet Take 1 tablet (80 mg total) by mouth daily. (Patient taking differently: Take 20 mg by mouth daily. ) 90 tablet 1   . zonisamide (ZONEGRAN) 100 MG capsule Take 200 mg by mouth at bedtime.    . budesonide-formoterol (SYMBICORT) 160-4.5 MCG/ACT inhaler Inhale 2 puffs into the lungs 2 (two) times daily.    . Calcium-Magnesium-Vitamin D (CALCIUM 1200+D3 PO) Take 1 tablet by mouth 2 (two) times daily.    . Cholecalciferol (VITAMIN D3 SUPER STRENGTH) 2000 units TABS Take 4,000 Units by mouth.    . gabapentin (NEURONTIN) 800 MG tablet Take 1 tablet (800 mg total) by mouth at bedtime. (per neurologist)    . nitrofurantoin, macrocrystal-monohydrate, (MACROBID) 100 MG capsule Take one tablet after sexual intercourse. 10 capsule 0  . Vitamin D, Ergocalciferol, (DRISDOL) 1.25 MG (50000 UT) CAPS capsule Take 1 capsule (50,000 Units total) by mouth every 7 (seven) days. 12 capsule 0  . zolpidem (AMBIEN) 5 MG tablet Take 5 mg by mouth at bedtime.      No facility-administered medications prior to visit.     Review of Systems:   Constitutional:   No  weight loss, night sweats,  Fevers, chills, fatigue, or  lassitude.  HEENT:   No headaches,  Difficulty swallowing,  Tooth/dental problems, or  Sore throat,                No sneezing, itching, ear ache, nasal congestion, post nasal drip,   CV:  No chest pain,  Orthopnea, PND, swelling in lower extremities, anasarca, dizziness, palpitations, syncope.   GI  No heartburn, indigestion, abdominal pain, nausea, vomiting, diarrhea, change in bowel habits, loss of appetite, bloody stools.   Resp: No shortness of breath with exertion or at rest.  No excess mucus, no productive cough,  No non-productive cough,  No coughing up of blood.  No change in color of mucus.  No wheezing.  No chest wall deformity  Skin: no rash or lesions.  GU: no dysuria, change in color of urine, no urgency or frequency.  No flank pain, no hematuria   MS:  No joint pain or swelling.  No decreased range of motion.  No back pain.    Physical Exam  BP 140/78 (BP Location: Left Arm, Patient  Position: Sitting, Cuff Size: Normal)   Pulse 73   Temp 97.7 F (36.5 C) (Temporal)   Ht 5\' 7"  (1.702 m)   Wt 172 lb 6.4 oz (78.2 kg)   SpO2 98% Comment: on RA  BMI 27.00 kg/m   GEN: A/Ox3; pleasant , NAD, well nourished    HEENT:  [place]/AT,  EACs-clear, TMs-wnl, NOSE-clear, THROAT-clear, no lesions, no postnasal drip or exudate noted.   NECK:  Supple w/ fair ROM; no JVD; normal carotid impulses w/o bruits; no thyromegaly or nodules palpated; no lymphadenopathy.    RESP  Clear  P & A; w/o, wheezes/ rales/ or rhonchi. no accessory muscle use, no dullness to percussion  CARD:  RRR, no  m/r/g, no peripheral edema, pulses intact, no cyanosis or clubbing.  GI:   Soft & nt; nml bowel sounds; no organomegaly or masses detected.   Musco: Warm bil, no deformities or joint swelling noted.   Neuro: alert, no focal deficits noted.    Skin: Warm, no lesions or rashes    Lab Results:  CBC    Component Value Date/Time   WBC 6.2 02/09/2018 0930   RBC 4.00 02/09/2018 0930   HGB 12.0 02/09/2018 0930   HCT 35.1 02/09/2018 0930   PLT 284 02/09/2018 0930   MCV 87.8 02/09/2018 0930   MCH 30.0 02/09/2018 0930   MCHC 34.2 02/09/2018 0930   RDW 12.5 02/09/2018 0930   LYMPHSABS 2,294 02/09/2018 0930   EOSABS 192 02/09/2018 0930   BASOSABS 93 02/09/2018 0930    BMET    Component Value Date/Time   NA 145 02/09/2018 0930   K 3.4 (L) 02/09/2018 0930   CL 108 02/09/2018 0930   CO2 26 02/09/2018 0930   GLUCOSE 90 02/09/2018 0930   BUN 11 02/09/2018 0930   CREATININE 0.59 02/09/2018 0930   CALCIUM 9.6 02/09/2018 0930   GFRNONAA 84 12/14/2017 0945   GFRAA 98 12/14/2017 0945    BNP No results found for: BNP  ProBNP No results found for: PROBNP  Imaging: DG Chest 2 View  Result Date: 11/02/2019 CLINICAL DATA:  Recent COVID-19 positive.  Cough.  Hypertension. EXAM: CHEST - 2 VIEW COMPARISON:  Chest CT March 29, 2018 and chest radiograph August 31, 2017 FINDINGS: There is slight  apical scarring bilaterally. No airspace opacity or edema evident. No pleural effusions. Heart size and pulmonary vascularity are normal. No adenopathy. There is old rib trauma on the right, stable. There is postoperative change in the right humerus. IMPRESSION: Mild apical scarring. No edema or airspace opacity. Cardiac silhouette within normal limits. No adenopathy. Electronically Signed   By: Lowella Grip III M.D.   On: 11/02/2019 11:36      No flowsheet data found.  No results found for: NITRICOXIDE      Assessment & Plan:   Pulmonary nodules Pulmonary nodules stable on CT chest 2018 and 2019 consistent with benign etiology.  Chest x-ray today shows no pulmonary nodules.  Shows mild apical scarring consistent with previous CT chest.  Asthma Mild intermittent asthma-possible mild flare with ongoing dry cough.  However no wheezing noted on exam.  Patient may try her albuterol inhaler as needed.  Suspect this is a post viral cough and lisinopril may be aggravating this.  Chest x-ray today shows no acute etiology. If not improving can return and check spirometry and/or add in controller inhaler  Plan Patient Instructions  Begin Delsym 2 tsp Twice daily  For cough , As needed  Cough .  Begin Tessalon Three times a day  As needed  Cough .  Discuss with Primary Provider that Lisinopril may be aggravating cough .  Chest xray today .  Covid vaccine as discussed (after 45 days ) .  Albuterol inhaler As needed  Wheezing/shortness of breath .  Follow up with Dr. Elsworth Soho or Neville Walston NP in 1 year and As needed   Please contact office for sooner follow up if symptoms do not improve or worsen or seek emergency care       Cough Ongoing cough x6 weeks suspect is secondary to recent COVID-19 virus with a post viral cough syndrome.  Advised to use cough suppressants with Delsym and Tessalon Perles.  Chest x-ray  today showed no acute etiology.  Lisinopril may be aggravating her underlying cough.   Can discuss with her primary care provided if this can be changed.  If not improving will need further work-up  Plan  Patient Instructions  Begin Delsym 2 tsp Twice daily  For cough , As needed  Cough .  Begin Tessalon Three times a day  As needed  Cough .  Discuss with Primary Provider that Lisinopril may be aggravating cough .  Chest xray today .  Covid vaccine as discussed (after 45 days ) .  Albuterol inhaler As needed  Wheezing/shortness of breath .  Follow up with Dr. Elsworth Soho or Yohannes Waibel NP in 1 year and As needed   Please contact office for sooner follow up if symptoms do not improve or worsen or seek emergency care          Rexene Edison, NP 11/02/2019

## 2019-11-02 NOTE — Patient Instructions (Signed)
Begin Delsym 2 tsp Twice daily  For cough , As needed  Cough .  Begin Tessalon Three times a day  As needed  Cough .  Discuss with Primary Provider that Lisinopril may be aggravating cough .  Chest xray today .  Covid vaccine as discussed (after 45 days ) .  Albuterol inhaler As needed  Wheezing/shortness of breath .  Follow up with Dr. Elsworth Soho or Miking Usrey NP in 1 year and As needed   Please contact office for sooner follow up if symptoms do not improve or worsen or seek emergency care

## 2019-11-02 NOTE — Assessment & Plan Note (Signed)
Mild intermittent asthma-possible mild flare with ongoing dry cough.  However no wheezing noted on exam.  Patient may try her albuterol inhaler as needed.  Suspect this is a post viral cough and lisinopril may be aggravating this.  Chest x-ray today shows no acute etiology. If not improving can return and check spirometry and/or add in controller inhaler  Plan Patient Instructions  Begin Delsym 2 tsp Twice daily  For cough , As needed  Cough .  Begin Tessalon Three times a day  As needed  Cough .  Discuss with Primary Provider that Lisinopril may be aggravating cough .  Chest xray today .  Covid vaccine as discussed (after 45 days ) .  Albuterol inhaler As needed  Wheezing/shortness of breath .  Follow up with Dr. Elsworth Soho or Javon Hupfer NP in 1 year and As needed   Please contact office for sooner follow up if symptoms do not improve or worsen or seek emergency care

## 2019-11-02 NOTE — Assessment & Plan Note (Signed)
Ongoing cough x6 weeks suspect is secondary to recent COVID-19 virus with a post viral cough syndrome.  Advised to use cough suppressants with Delsym and Tessalon Perles.  Chest x-ray today showed no acute etiology.  Lisinopril may be aggravating her underlying cough.  Can discuss with her primary care provided if this can be changed.  If not improving will need further work-up  Plan  Patient Instructions  Begin Delsym 2 tsp Twice daily  For cough , As needed  Cough .  Begin Tessalon Three times a day  As needed  Cough .  Discuss with Primary Provider that Lisinopril may be aggravating cough .  Chest xray today .  Covid vaccine as discussed (after 45 days ) .  Albuterol inhaler As needed  Wheezing/shortness of breath .  Follow up with Dr. Elsworth Soho or Dak Szumski NP in 1 year and As needed   Please contact office for sooner follow up if symptoms do not improve or worsen or seek emergency care

## 2019-11-07 DIAGNOSIS — G35 Multiple sclerosis: Secondary | ICD-10-CM | POA: Diagnosis not present

## 2019-11-07 DIAGNOSIS — Z79899 Other long term (current) drug therapy: Secondary | ICD-10-CM | POA: Diagnosis not present

## 2019-11-27 DIAGNOSIS — R262 Difficulty in walking, not elsewhere classified: Secondary | ICD-10-CM | POA: Diagnosis not present

## 2019-11-27 DIAGNOSIS — G35 Multiple sclerosis: Secondary | ICD-10-CM | POA: Diagnosis not present

## 2019-12-13 DIAGNOSIS — G35 Multiple sclerosis: Secondary | ICD-10-CM | POA: Diagnosis not present

## 2019-12-13 DIAGNOSIS — R262 Difficulty in walking, not elsewhere classified: Secondary | ICD-10-CM | POA: Diagnosis not present

## 2019-12-31 ENCOUNTER — Ambulatory Visit (INDEPENDENT_AMBULATORY_CARE_PROVIDER_SITE_OTHER): Payer: PPO | Admitting: Family Medicine

## 2019-12-31 ENCOUNTER — Encounter: Payer: Self-pay | Admitting: Family Medicine

## 2019-12-31 ENCOUNTER — Other Ambulatory Visit: Payer: Self-pay

## 2019-12-31 DIAGNOSIS — R269 Unspecified abnormalities of gait and mobility: Secondary | ICD-10-CM | POA: Diagnosis not present

## 2019-12-31 DIAGNOSIS — M7062 Trochanteric bursitis, left hip: Secondary | ICD-10-CM

## 2019-12-31 DIAGNOSIS — M1712 Unilateral primary osteoarthritis, left knee: Secondary | ICD-10-CM | POA: Insufficient documentation

## 2019-12-31 DIAGNOSIS — M25532 Pain in left wrist: Secondary | ICD-10-CM | POA: Diagnosis not present

## 2019-12-31 DIAGNOSIS — Z4789 Encounter for other orthopedic aftercare: Secondary | ICD-10-CM | POA: Diagnosis not present

## 2019-12-31 DIAGNOSIS — M79642 Pain in left hand: Secondary | ICD-10-CM | POA: Diagnosis not present

## 2019-12-31 HISTORY — DX: Trochanteric bursitis, left hip: M70.62

## 2019-12-31 MED ORDER — AMBULATORY NON FORMULARY MEDICATION: 0 refills | Status: DC

## 2019-12-31 NOTE — Patient Instructions (Addendum)
Good to see you Vitamin D at least 2,000 daily Tart cherry 1200 mg at night See me again in 6 weeks

## 2019-12-31 NOTE — Assessment & Plan Note (Signed)
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  Will be referred for an AFO that I think will be beneficial for her.

## 2019-12-31 NOTE — Assessment & Plan Note (Signed)
Injection given today, tolerated the procedure well.  Patient does have some arthritic changes but also has significant weakness of the left lower extremity secondary to likely multiple sclerosis.  Causes more discomfort on the knee and the joint itself.  Patient will try to make progress follow-up with me again in 4 to 6 weeks.  Could be a candidate for viscosupplementation.  Discussed medication management including Cymbalta for pain relief.

## 2019-12-31 NOTE — Progress Notes (Signed)
[facility] [address] [place] Phone: [phone] Subjective:   I Cynthia Nichols am serving as a Education administrator for Dr. Hulan Saas.  This visit occurred during the SARS-CoV-2 public health emergency.  Safety protocols were in place, including screening questions prior to the visit, additional usage of staff PPE, and extensive cleaning of exam room while observing appropriate contact time as indicated for disinfecting solutions.   I'm seeing this patient by the request  of:  Leilani Able, FNP  CC: Left knee and left hip pain  [id number]  Cynthia Nichols is a 59 y.o. female coming in with complaint of left knee and left hip pain. Last seen 11/06/2018 for left ankle pain. Knee pops. Patient states she doesn't have pain all the time. Wouldn't mind an injection for the knee.   Onset- Chronic  Location - left sided lower back/ hip Duration- consistent back pain  Character- sharp, tooth ache  Aggravating factors- walking  Reliving factors-  Therapies tried- ice, heat, topical, oral  Severity- 9/10 at its worse   Past medical history significant for MS  Past Medical History:  Diagnosis Date  . Adopted    Patient has no known family history  . Asthma   . Headache(784.0)   . 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 Hypertension   . IBS (irritable bowel syndrome)   . MS (multiple sclerosis) ([place])   . PONV (postoperative nausea and vomiting)   . Venous aneurysm    Right side of neck   Past Surgical History:  Procedure Laterality Date  . ABDOMINAL HYSTERECTOMY    . BREAST MASS EXCISION     Bilateral breast mass excision ( Benign)  . ORIF HUMERUS FRACTURE Right 05/17/2017  . TENNIS ELBOW RELEASE/NIRSCHEL PROCEDURE Right 05/17/2017   Procedure: EXPLORATION AND EXPOSURE OF RADIAL NERVE RIGHT ARM;  Surgeon: Roseanne Kaufman, MD;  Location: [facility];  Service: Orthopedics;  Laterality: Right;   Social History    Socioeconomic History  . Marital status: Married    Spouse name: Heavenlyjoy Ellenwood  . Number of children: 3  . Years of education: Not on file  . Highest education level: Associate degree: occupational, Hotel manager, or vocational program  Occupational History  . Occupation: disabled    Comment: Therapist, sports  Tobacco Use  . Smoking status: Never Smoker  . Smokeless tobacco: Never Used  Substance and Sexual Activity  . Alcohol use: No    Alcohol/week: 0.0 standard drinks  . Drug use: No  . Sexual activity: Yes    Partners: Male  Other Topics Concern  . Not on file  Social History Narrative  . Not on file   Social Determinants of Health   Financial Resource Strain: Low Risk   . Difficulty of Paying Living Expenses: Not hard at all  Food Insecurity: No Food Insecurity  . Worried About Charity fundraiser in the Last Year: Never true  . Ran Out of Food in the Last Year: Never true  Transportation Needs: No Transportation Needs  . Lack of Transportation (Medical): No  . Lack of Transportation (Non-Medical): No  Physical Activity: Sufficiently Active  . Days of Exercise per Week: 7 days  . Minutes of Exercise per Session: 30 min  Stress: No Stress Concern Present  . Feeling of Stress : Not at all  Social Connections: Not Isolated  . Frequency of Communication with Friends and Family: More than three times a week  .  Frequency of Social Gatherings with Friends and Family: Once a week  . Attends Religious Services: More than 4 times per year  . Active Member of Clubs or Organizations: Yes  . Attends Archivist Meetings: More than 4 times per year  . Marital Status: Married   Allergies  Allergen Reactions  . Ampyra [Dalfampridine] Anaphylaxis and Shortness Of Breath    Chest pain, also   . Imitrex [Sumatriptan] Other (See Comments)    Chest tightness  . Crestor [Rosuvastatin Calcium] Other (See Comments)    Abdominal pain, shortness of breath  . Peanut-Containing Drug Products  Itching    Itching in the mouth (remarked that she still eats this in limited amounts)  . Shrimp [Shellfish Allergy] Itching    Itching in the mouth (remarked that she still eats this in limited amounts)   Family History  Adopted: Yes     Current Outpatient Medications (Cardiovascular):  .  bisoprolol (ZEBETA) 5 MG tablet, TAKE 1/2 TABLET (2.5 MG TOTAL) BY MOUTH DAILY .  lisinopril (ZESTRIL) 40 MG tablet, Take 1 tablet (40 mg total) by mouth at bedtime. .  pravastatin (PRAVACHOL) 80 MG tablet, Take 1 tablet (80 mg total) by mouth daily. (Patient taking differently: Take 20 mg by mouth daily. )  Current Outpatient Medications (Respiratory):  .  albuterol (PROVENTIL HFA) 108 (90 Base) MCG/ACT inhaler, Inhale into the lungs every 6 (six) hours as needed.  .  benzonatate (TESSALON) 200 MG capsule, Take 1 capsule (200 mg total) by mouth 3 (three) times daily as needed for cough. .  budesonide-formoterol (SYMBICORT) 160-4.5 MCG/ACT inhaler, Inhale 2 puffs into the lungs 2 (two) times daily.  Current Outpatient Medications (Analgesics):  .  butalbital-acetaminophen-caffeine (FIORICET, ESGIC) 50-325-40 MG per tablet, Take 1 tablet by mouth 2 (two) times daily as needed for migraine.  .  naratriptan (AMERGE) 2.5 MG tablet, Take 2.5 mg by mouth as needed.    Current Outpatient Medications (Other):  .  amphetamine-dextroamphetamine (ADDERALL XR) 10 MG 24 hr capsule, Take 10 mg by mouth as needed.  .  DULoxetine (CYMBALTA) 30 MG capsule, Take 60 mg by mouth daily.  Marland Kitchen  zonisamide (ZONEGRAN) 100 MG capsule, Take 200 mg by mouth at bedtime. .  AMBULATORY NON FORMULARY MEDICATION, Left sided AFO   Reviewed prior external information including notes and imaging from  primary care provider As well as notes that were available from care everywhere and other healthcare systems.  Past medical history, social, surgical and family history all reviewed in electronic medical record.  No pertanent  information unless stated regarding to the chief complaint.   Review of Systems:  No headache, visual changes, nausea, vomiting, diarrhea, constipation, dizziness, abdominal pain, skin rash, fevers, chills, night sweats, weight loss, swollen lymph nodes, body aches, joint swelling, chest pain, shortness of breath, mood changes. POSITIVE muscle aches  Objective  Blood pressure 100/72, pulse 75, height 5\' 7"  (1.702 m), weight 171 lb (77.6 kg), SpO2 99 %.   General: No apparent distress alert and oriented x3 mood and affect normal, dressed appropriately.  HEENT: Pupils equal, extraocular movements intact  Respiratory: Patient's speak in full sentences and does not appear short of breath  Cardiovascular: No lower extremity edema, non tender, no erythema  Neuro: Cranial nerves II through XII are intact, neurovascularly intact in all extremities with 2+ DTRs and 2+ pulses.  Gait significant weakness noted of the left lower extremity.  Patient is using a cane.  Patient does have foot drop  that are noted on the left side Left knee continues to have some chronic subluxation of the patella noted.  Positive patellar grind noted.  Near full range of motion.  4-5 strength compared to contralateral side.  Left hip exam shows positive Corky Sox.  Tenderness to palpation of the greater trochanteric area of mild over the gluteal area and the piriformis.  Mild over the sacroiliac joint.  After verbal consent patient was prepped with alcohol swab and with a 21-gauge 2 inch needle injected into the left greater trochanteric area with a total of 1 cc of 0.5% Marcaine and 1 cc of Kenalog 40 mg/mL.  No blood loss.  Postinjection instructions given.  After informed written and verbal consent, patient was seated on exam table. Left knee was prepped with alcohol swab and utilizing anterolateral approach, patient's left knee space was injected with 4:1  marcaine 0.5%: Kenalog 40mg /dL. Patient tolerated the procedure well  without immediate complications.   Impression and Recommendations:     This case required medical decision making of moderate complexity. The above documentation has been reviewed and is accurate and complete Lyndal Pulley, DO       Note: This dictation was prepared with Dragon dictation along with smaller phrase technology. Any transcriptional errors that result from this process are unintentional.

## 2019-12-31 NOTE — Assessment & Plan Note (Signed)
Injection given today, tolerated the procedure well, discussed icing regimen and home exercise, which activities to do which wants to avoid.  Increase activity as slowly.  If no improvement we will consider osteopathic manipulation at follow-up.  Also may need further evaluation of patient's lumbar spine.  Medication management including Cymbalta for more of the radicular symptoms.

## 2020-01-03 ENCOUNTER — Ambulatory Visit: Payer: PPO | Attending: Internal Medicine

## 2020-01-03 DIAGNOSIS — Z23 Encounter for immunization: Secondary | ICD-10-CM

## 2020-01-03 NOTE — Progress Notes (Signed)
   Covid-19 Vaccination Clinic  Name:  LEVIA WEINBAUM    MRN: [id number] DOB: February 28, 1961  01/03/2020  Ms. Meeuwsen was observed post Covid-19 immunization for 15 minutes without incident. She was provided with Vaccine Information Sheet and instruction to access the V-Safe system.   Ms. Mixson was instructed to call 911 with any severe reactions post vaccine: Marland Kitchen Difficulty breathing  . Swelling of face and throat  . A fast heartbeat  . A bad rash all over body  . Dizziness and weakness   Immunizations Administered    Name Date Dose VIS Date Route   Pfizer COVID-19 Vaccine 01/03/2020 10:15 AM 0.3 mL 08/31/2019 Intramuscular   Manufacturer: [place]   Lot: [id number]   [place]: [id number]

## 2020-01-08 ENCOUNTER — Other Ambulatory Visit: Payer: Self-pay

## 2020-01-08 ENCOUNTER — Encounter: Payer: Self-pay | Admitting: Family Medicine

## 2020-01-08 MED ORDER — AMBULATORY NON FORMULARY MEDICATION: 1.0000 [IU] | Freq: Once | 0 refills | Status: AC

## 2020-01-29 ENCOUNTER — Ambulatory Visit: Payer: Self-pay

## 2020-01-30 ENCOUNTER — Ambulatory Visit: Payer: PPO | Attending: Internal Medicine

## 2020-01-30 DIAGNOSIS — G35 Multiple sclerosis: Secondary | ICD-10-CM | POA: Diagnosis not present

## 2020-01-30 DIAGNOSIS — Z23 Encounter for immunization: Secondary | ICD-10-CM

## 2020-01-30 DIAGNOSIS — M21371 Foot drop, right foot: Secondary | ICD-10-CM | POA: Diagnosis not present

## 2020-01-30 NOTE — Progress Notes (Signed)
   Covid-19 Vaccination Clinic  Name:  Cynthia Nichols    MRN: [id number] DOB: Feb 01, 1961  01/30/2020  Cynthia Nichols was observed post Covid-19 immunization for 30 minutes based on pre-vaccination screening without incident. She was provided with Vaccine Information Sheet and instruction to access the V-Safe system.   Cynthia Nichols was instructed to call 911 with any severe reactions post vaccine: Marland Kitchen Difficulty breathing  . Swelling of face and throat  . A fast heartbeat  . A bad rash all over body  . Dizziness and weakness   Immunizations Administered    Name Date Dose VIS Date Route   Pfizer COVID-19 Vaccine 01/30/2020 11:05 AM 0.3 mL 11/14/2018 Intramuscular   Manufacturer: [place]   Lot: [id number]   [place]: [id number]

## 2020-02-12 ENCOUNTER — Ambulatory Visit: Payer: PPO | Admitting: Family Medicine

## 2020-02-12 DIAGNOSIS — Z0289 Encounter for other administrative examinations: Secondary | ICD-10-CM

## 2020-02-12 NOTE — Progress Notes (Deleted)
[facility] [place] Phone: [phone] Subjective:    I'm seeing this patient by the request  of:  Leilani Able, FNP  CC:   [id number]   12/31/2019 Injection given today, tolerated the procedure well, discussed icing regimen and home exercise, which activities to do which wants to avoid.  Increase activity as slowly.  If no improvement we will consider osteopathic manipulation at follow-up.  Also may need further evaluation of patient's lumbar spine.  Medication management including Cymbalta for more of the radicular symptoms.  Injection given today, tolerated the procedure well.  Patient does have some arthritic changes but also has significant weakness of the left lower extremity secondary to likely multiple sclerosis.  Causes more discomfort on the knee and the joint itself.  Patient will try to make progress follow-up with me again in 4 to 6 weeks.  Could be a candidate for viscosupplementation.  Discussed medication management including Cymbalta for pain relief.  Update 02/12/2020 Cynthia Nichols is a 59 y.o. female coming in with complaint of ***  Onset-  Location Duration-  Character- Aggravating factors- Reliving factors-  Therapies tried-  Severity-     Past Medical History:  Diagnosis Date  . Adopted    Patient has no known family history  . Asthma   . Headache(784.0)   . 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 Hypertension   . IBS (irritable bowel syndrome)   . MS (multiple sclerosis) ([place])   . PONV (postoperative nausea and vomiting)   . Venous aneurysm    Right side of neck   Past Surgical History:  Procedure Laterality Date  . ABDOMINAL HYSTERECTOMY    . BREAST MASS EXCISION     Bilateral breast mass excision ( Benign)  . ORIF HUMERUS FRACTURE Right 05/17/2017  . TENNIS ELBOW RELEASE/NIRSCHEL PROCEDURE Right 05/17/2017   Procedure: EXPLORATION AND EXPOSURE OF RADIAL  NERVE RIGHT ARM;  Surgeon: Roseanne Kaufman, MD;  Location: [place];  Service: Orthopedics;  Laterality: Right;   Social History   Socioeconomic History  . Marital status: Married    Spouse name: Eliene Franciosa  . Number of children: 3  . Years of education: Not on file  . Highest education level: Associate degree: occupational, Hotel manager, or vocational program  Occupational History  . Occupation: disabled    Comment: Therapist, sports  Tobacco Use  . Smoking status: Never Smoker  . Smokeless tobacco: Never Used  Substance and Sexual Activity  . Alcohol use: No    Alcohol/week: 0.0 standard drinks  . Drug use: No  . Sexual activity: Yes    Partners: Male  Other Topics Concern  . Not on file  Social History Narrative  . Not on file   Social Determinants of Health   Financial Resource Strain:   . Difficulty of Paying Living Expenses:   Food Insecurity:   . Worried About Charity fundraiser in the Last Year:   . Arboriculturist in the Last Year:   Transportation Needs:   . Film/video editor (Medical):   Marland Kitchen Lack of Transportation (Non-Medical):   Physical Activity:   . Days of Exercise per Week:   . Minutes of Exercise per Session:   Stress:   . Feeling of Stress :   Social Connections:   . Frequency of Communication with Friends and Family:   . Frequency of Social Gatherings with Friends and Family:   .  Attends Religious Services:   . Active Member of Clubs or Organizations:   . Attends Archivist Meetings:   Marland Kitchen Marital Status:    Allergies  Allergen Reactions  . Ampyra [Dalfampridine] Anaphylaxis and Shortness Of Breath    Chest pain, also   . Imitrex [Sumatriptan] Other (See Comments)    Chest tightness  . Crestor [Rosuvastatin Calcium] Other (See Comments)    Abdominal pain, shortness of breath  . Peanut-Containing Drug Products Itching    Itching in the mouth (remarked that she still eats this in limited amounts)  . Shrimp [Shellfish Allergy] Itching    Itching in  the mouth (remarked that she still eats this in limited amounts)   Family History  Adopted: Yes     Current Outpatient Medications (Cardiovascular):  .  bisoprolol (ZEBETA) 5 MG tablet, TAKE 1/2 TABLET (2.5 MG TOTAL) BY MOUTH DAILY .  lisinopril (ZESTRIL) 40 MG tablet, Take 1 tablet (40 mg total) by mouth at bedtime. .  pravastatin (PRAVACHOL) 80 MG tablet, Take 1 tablet (80 mg total) by mouth daily. (Patient taking differently: Take 20 mg by mouth daily. )  Current Outpatient Medications (Respiratory):  .  albuterol (PROVENTIL HFA) 108 (90 Base) MCG/ACT inhaler, Inhale into the lungs every 6 (six) hours as needed.  .  benzonatate (TESSALON) 200 MG capsule, Take 1 capsule (200 mg total) by mouth 3 (three) times daily as needed for cough. .  budesonide-formoterol (SYMBICORT) 160-4.5 MCG/ACT inhaler, Inhale 2 puffs into the lungs 2 (two) times daily.  Current Outpatient Medications (Analgesics):  .  butalbital-acetaminophen-caffeine (FIORICET, ESGIC) 50-325-40 MG per tablet, Take 1 tablet by mouth 2 (two) times daily as needed for migraine.  .  naratriptan (AMERGE) 2.5 MG tablet, Take 2.5 mg by mouth as needed.    Current Outpatient Medications (Other):  Marland Kitchen  AMBULATORY NON FORMULARY MEDICATION, Left sided AFO .  amphetamine-dextroamphetamine (ADDERALL XR) 10 MG 24 hr capsule, Take 10 mg by mouth as needed.  .  DULoxetine (CYMBALTA) 30 MG capsule, Take 60 mg by mouth daily.  Marland Kitchen  zonisamide (ZONEGRAN) 100 MG capsule, Take 200 mg by mouth at bedtime.   Reviewed prior external information including notes and imaging from  primary care provider As well as notes that were available from care everywhere and other healthcare systems.  Past medical history, social, surgical and family history all reviewed in electronic medical record.  No pertanent information unless stated regarding to the chief complaint.   Review of Systems:  No headache, visual changes, nausea, vomiting, diarrhea,  constipation, dizziness, abdominal pain, skin rash, fevers, chills, night sweats, weight loss, swollen lymph nodes, body aches, joint swelling, chest pain, shortness of breath, mood changes. POSITIVE muscle aches  Objective  There were no vitals taken for this visit.   General: No apparent distress alert and oriented x3 mood and affect normal, dressed appropriately.  HEENT: Pupils equal, extraocular movements intact  Respiratory: Patient's speak in full sentences and does not appear short of breath  Cardiovascular: No lower extremity edema, non tender, no erythema  Neuro: Cranial nerves II through XII are intact, neurovascularly intact in all extremities with 2+ DTRs and 2+ pulses.  Gait normal with good balance and coordination.  MSK:  Non tender with full range of motion and good stability and symmetric strength and tone of shoulders, elbows, wrist, hip, knee and ankles bilaterally.     Impression and Recommendations:     This case required medical decision making of moderate complexity.  The above documentation has been reviewed and is accurate and complete Cynthia Nichols       Note: This dictation was prepared with Dragon dictation along with smaller phrase technology. Any transcriptional errors that result from this process are unintentional.

## 2020-03-06 DIAGNOSIS — K219 Gastro-esophageal reflux disease without esophagitis: Secondary | ICD-10-CM | POA: Diagnosis not present

## 2020-03-06 DIAGNOSIS — J329 Chronic sinusitis, unspecified: Secondary | ICD-10-CM | POA: Diagnosis not present

## 2020-04-03 DIAGNOSIS — Z6826 Body mass index (BMI) 26.0-26.9, adult: Secondary | ICD-10-CM | POA: Diagnosis not present

## 2020-04-03 DIAGNOSIS — Z1272 Encounter for screening for malignant neoplasm of vagina: Secondary | ICD-10-CM | POA: Diagnosis not present

## 2020-04-03 DIAGNOSIS — Z90710 Acquired absence of both cervix and uterus: Secondary | ICD-10-CM | POA: Diagnosis not present

## 2020-04-03 DIAGNOSIS — Z124 Encounter for screening for malignant neoplasm of cervix: Secondary | ICD-10-CM | POA: Diagnosis not present

## 2020-04-16 DIAGNOSIS — N958 Other specified menopausal and perimenopausal disorders: Secondary | ICD-10-CM | POA: Diagnosis not present

## 2020-04-16 DIAGNOSIS — Z1231 Encounter for screening mammogram for malignant neoplasm of breast: Secondary | ICD-10-CM | POA: Diagnosis not present

## 2020-04-16 DIAGNOSIS — M8588 Other specified disorders of bone density and structure, other site: Secondary | ICD-10-CM | POA: Diagnosis not present

## 2020-04-23 DIAGNOSIS — Z6826 Body mass index (BMI) 26.0-26.9, adult: Secondary | ICD-10-CM | POA: Diagnosis not present

## 2020-04-23 DIAGNOSIS — G35 Multiple sclerosis: Secondary | ICD-10-CM | POA: Diagnosis not present

## 2020-04-23 DIAGNOSIS — Z1331 Encounter for screening for depression: Secondary | ICD-10-CM | POA: Diagnosis not present

## 2020-04-23 DIAGNOSIS — Z8639 Personal history of other endocrine, nutritional and metabolic disease: Secondary | ICD-10-CM | POA: Diagnosis not present

## 2020-04-23 DIAGNOSIS — R002 Palpitations: Secondary | ICD-10-CM | POA: Diagnosis not present

## 2020-04-23 DIAGNOSIS — I119 Hypertensive heart disease without heart failure: Secondary | ICD-10-CM | POA: Diagnosis not present

## 2020-04-29 ENCOUNTER — Other Ambulatory Visit (HOSPITAL_COMMUNITY): Payer: Self-pay | Admitting: Registered Nurse

## 2020-05-01 DIAGNOSIS — R002 Palpitations: Secondary | ICD-10-CM | POA: Diagnosis not present

## 2020-05-09 DIAGNOSIS — R002 Palpitations: Secondary | ICD-10-CM | POA: Diagnosis not present

## 2020-05-28 DIAGNOSIS — L820 Inflamed seborrheic keratosis: Secondary | ICD-10-CM | POA: Diagnosis not present

## 2020-05-28 DIAGNOSIS — L237 Allergic contact dermatitis due to plants, except food: Secondary | ICD-10-CM | POA: Diagnosis not present

## 2020-05-28 DIAGNOSIS — L299 Pruritus, unspecified: Secondary | ICD-10-CM | POA: Diagnosis not present

## 2020-05-30 DIAGNOSIS — Z6826 Body mass index (BMI) 26.0-26.9, adult: Secondary | ICD-10-CM | POA: Diagnosis not present

## 2020-05-30 DIAGNOSIS — K5900 Constipation, unspecified: Secondary | ICD-10-CM | POA: Diagnosis not present

## 2020-05-30 DIAGNOSIS — I119 Hypertensive heart disease without heart failure: Secondary | ICD-10-CM | POA: Diagnosis not present

## 2020-05-30 DIAGNOSIS — N3090 Cystitis, unspecified without hematuria: Secondary | ICD-10-CM | POA: Diagnosis not present

## 2020-06-04 DIAGNOSIS — Z9889 Other specified postprocedural states: Secondary | ICD-10-CM | POA: Insufficient documentation

## 2020-06-04 DIAGNOSIS — S42309A Unspecified fracture of shaft of humerus, unspecified arm, initial encounter for closed fracture: Secondary | ICD-10-CM | POA: Insufficient documentation

## 2020-06-04 DIAGNOSIS — I10 Essential (primary) hypertension: Secondary | ICD-10-CM | POA: Insufficient documentation

## 2020-06-04 DIAGNOSIS — G35 Multiple sclerosis: Secondary | ICD-10-CM | POA: Insufficient documentation

## 2020-06-04 DIAGNOSIS — Z0282 Encounter for adoption services: Secondary | ICD-10-CM | POA: Insufficient documentation

## 2020-06-05 NOTE — Progress Notes (Signed)
Cardiology Office Note:    Date:  06/06/2020   ID:  Cynthia Nichols, DOB Jun 17, 1961, MRN [id number]  PCP:  Leilani Able, FNP  Cardiologist:  Shirlee More, MD   Referring MD: Farrel Conners*  ASSESSMENT:    1. Palpitations   2. Essential hypertension   3. Mixed hyperlipidemia   4. Multiple sclerosis, relapsing-remitting ([place])    PLAN:    In order of problems listed above:  1. Although the referral says SVT I think the appropriate diagnosis is palpitation I suspect much of it is related to withdrawal of her beta-blocker.  I think in her case with a structurally normal heart normal EKG and the ability to monitor heart rhythm at home she could resume a low-dose of a selective beta-blocker.  If she has documented arrhythmia would have to consider other treatment and I will bring her back in the office in 6 weeks to resume her monitor with her. 2. Stable BP at target continue ACE resume low-dose selective beta-blocker 3. Continue her statin 4. Stable managed at [facility], I reviewed records during the visit.  Next appointment 6 weeks   Medication Adjustments/Labs and Tests Ordered: Current medicines are reviewed at length with the patient today.  Concerns regarding medicines are outlined above.  Orders Placed This Encounter  Procedures  . EKG 12-Lead   Meds ordered this encounter  Medications  . acebutolol (SECTRAL) 200 MG capsule    Sig: Take 1 capsule (200 mg total) by mouth daily.    Dispense:  90 capsule    Refill:  3     Chief Complaint  Patient presents with  . Palpitations    History of Present Illness:    Cynthia Nichols is a 59 y.o. female who is being seen today for the evaluation of palpitation at the request of Clydie Braun T, F*. She had a ZIO monitor applied at her PCP office results are unavailable. Recent labs 04/23/2020 show white count diminished 3200 hemoglobin of 13.4.  CMP was normal GFR greater than 60 cc creatinine 0.8 potassium  3.8 normal liver function test.  Cholesterol 257 LDL 165 triglyceride 142 HDL 64 TSH and free T4 were normal. She was previously seen by Dr. Roosvelt Harps in 2013 for palpitation.  Time her heart rhythm monitor showed sinus rhythm and APCs less than 1% and she had a stress echo performed July 04 2012 which was normal.  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  She has a long history of palpitation hypertension had taken a beta-blocker for both that really worked well and alleviated her symptoms.  She has no documented sustained arrhythmia.  Recently was put on a biologic agent for multiple sclerosis her beta-blocker is discontinued.  She is just felt badly aware of her heart beating and feels forceful and rapid but her heart rate is 78 bpm at home she wore a ZIO monitor which showed no sustained or significant arrhythmia.  In particular she thinks her problem is being off of beta-blocker would like to resume 1.  I stop it was doing research the drug and says at times it can cause second or third-degree AV block and to speak with the cardiologist if you take a beta-blocker.  What of asked her to do is to purchase the iPhone adapter to monitor her heart rhythm at home twice a day go back on a low-dose of a selective beta-blocker to alleviate her symptoms.  I do  not think she requires an ischemia evaluation or repeat echocardiogram at this time.  She is not having chest pain shortness of breath or syncope.  No known history of heart disease congenital rheumatic heart failure heart murmur. Past Medical History:  Diagnosis Date  . Adopted    Patient has no known family history  . 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 Aortic atherosclerosis ([place]) 11/29/2017  . Asthma   . Asthma 11/02/2019  . Closed fracture of shaft of right humerus 05/13/2017  . Closed low lateral malleolus fracture, left, initial encounter 10/17/2018  . 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  Essential hypertension, benign 11/29/2017  . Fracture of fifth metatarsal bone 08/13/2015  . Greater trochanteric bursitis, left 12/31/2019   Injection given December 31, 2019  . Headache(784.0)   . 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 Hypertension   . IBS (irritable bowel syndrome)   . Migraine without aura and without status migrainosus, not intractable 02/09/2018  . MS (multiple sclerosis) ([place])   . Multiple sclerosis, relapsing-remitting ([place]) 12/14/2011  . Osteoporosis 11/29/2017  . Other fracture of shaft of right humerus, initial encounter for closed fracture 05/13/2017  . Patella fracture 07/03/2015   Hairline oblique    . Patellar subluxation, left, initial encounter 08/14/2018  . PONV (postoperative nausea and vomiting)   . 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 Unspecified venous (peripheral) insufficiency 11/09/2011  . Venous aneurysm    Right side of neck    Past Surgical History:  Procedure Laterality Date  . ABDOMINAL HYSTERECTOMY    . BREAST MASS EXCISION     Bilateral breast mass excision ( Benign)  . ORIF HUMERUS FRACTURE Right 05/17/2017  . TENNIS ELBOW RELEASE/NIRSCHEL PROCEDURE Right 05/17/2017   Procedure: EXPLORATION AND EXPOSURE OF RADIAL NERVE RIGHT ARM;  Surgeon: Roseanne Kaufman, MD;  Location: [place];  Service: Orthopedics;  Laterality: Right;    Current Medications: Current Meds  Medication Sig  . albuterol (PROVENTIL HFA) 108 (90 Base) MCG/ACT inhaler Inhale into the lungs every 6 (six) hours as needed.   . AMBULATORY NON FORMULARY MEDICATION Left sided AFO  . DULoxetine (CYMBALTA) 60 MG capsule Take 60 mg by mouth at bedtime.  Marland Kitchen lisinopril (ZESTRIL) 40 MG tablet Take 1 tablet (40 mg total) by mouth at bedtime.  . naratriptan (AMERGE) 2.5 MG tablet Take 2.5 mg by mouth as needed.   . pravastatin (PRAVACHOL) 20 MG tablet Take 20 mg by mouth daily.  . Siponimod Fumarate (MAYZENT)  0.25 MG TABS Take 2 mg by mouth daily.  Marland Kitchen zolpidem (AMBIEN CR) 6.25 MG CR tablet Take 6.25 mg by mouth at bedtime.  Marland Kitchen zonisamide (ZONEGRAN) 100 MG capsule Take 200 mg by mouth at bedtime.  . [DISCONTINUED] DULoxetine (CYMBALTA) 30 MG capsule Take 60 mg by mouth daily.      Allergies:   Ampyra [dalfampridine], Imitrex [sumatriptan], Crestor [rosuvastatin calcium], Peanut-containing drug products, and Shrimp [shellfish allergy]   Social History   Socioeconomic History  . Marital status: Married    Spouse name: Reid Nawrot  . Number of children: 3  . Years of education: Not on file  . Highest education level: Associate degree: occupational, Hotel manager, or vocational program  Occupational History  . Occupation: disabled    Comment: Therapist, sports  Tobacco Use  . Smoking status: Never Smoker  . Smokeless tobacco: Never Used  Vaping Use  .  Vaping Use: Never used  Substance and Sexual Activity  . Alcohol use: No    Alcohol/week: 0.0 standard drinks  . Drug use: No  . Sexual activity: Yes    Partners: Male  Other Topics Concern  . Not on file  Social History Narrative  . Not on file   Social Determinants of Health   Financial Resource Strain:   . Difficulty of Paying Living Expenses: Not on file  Food Insecurity:   . Worried About Charity fundraiser in the Last Year: Not on file  . Ran Out of Food in the Last Year: Not on file  Transportation Needs:   . Lack of Transportation (Medical): Not on file  . Lack of Transportation (Non-Medical): Not on file  Physical Activity:   . Days of Exercise per Week: Not on file  . Minutes of Exercise per Session: Not on file  Stress:   . Feeling of Stress : Not on file  Social Connections:   . Frequency of Communication with Friends and Family: Not on file  . Frequency of Social Gatherings with Friends and Family: Not on file  . Attends Religious Services: Not on file  . Active Member of Clubs or Organizations: Not on file  . Attends Theatre manager Meetings: Not on file  . Marital Status: Not on file     Family History: The patient's family history is not on file. She was adopted.  ROS:   Review of Systems  Constitutional: Negative.  HENT: Negative.   Eyes: Negative.   Cardiovascular: Positive for palpitations.  Respiratory: Negative.   Endocrine: Negative.   Hematologic/Lymphatic: Negative.   Skin: Negative.   Musculoskeletal: Negative.   Gastrointestinal: Negative.   Genitourinary: Negative.   Neurological: Positive for focal weakness.  Psychiatric/Behavioral: Negative.   Allergic/Immunologic: Negative.    Please see the history of present illness.     All other systems reviewed and are negative.  EKGs/Labs/Other Studies Reviewed:    The following studies were reviewed today:   EKG:  EKG is  ordered today.  The ekg ordered today is personally reviewed and demonstrates sinus rhythm and is normal    Physical Exam:    VS:  BP 127/84   Pulse 80   Ht 5\' 7"  (1.702 m)   Wt 169 lb 6.4 oz (76.8 kg)   SpO2 98%   BMI 26.53 kg/m     Wt Readings from Last 3 Encounters:  06/06/20 169 lb 6.4 oz (76.8 kg)  12/31/19 171 lb (77.6 kg)  11/02/19 172 lb 6.4 oz (78.2 kg)     GEN:  Well nourished, well developed in no acute distress HEENT: Normal NECK: No JVD; No carotid bruits LYMPHATICS: No lymphadenopathy CARDIAC: RRR, no murmurs, rubs, gallops RESPIRATORY:  Clear to auscultation without rales, wheezing or rhonchi  ABDOMEN: Soft, non-tender, non-distended MUSCULOSKELETAL:  No edema; No deformity  SKIN: Warm and dry NEUROLOGIC:  Alert and oriented x 3 PSYCHIATRIC:  Normal affect     Signed, Shirlee More, MD  06/06/2020 12:02 PM    [place]

## 2020-06-06 ENCOUNTER — Ambulatory Visit: Payer: PPO | Admitting: Cardiology

## 2020-06-06 ENCOUNTER — Other Ambulatory Visit: Payer: Self-pay

## 2020-06-06 VITALS — BP 127/84 | HR 80 | Ht 67.0 in | Wt 169.4 lb

## 2020-06-06 DIAGNOSIS — R002 Palpitations: Secondary | ICD-10-CM

## 2020-06-06 DIAGNOSIS — E782 Mixed hyperlipidemia: Secondary | ICD-10-CM

## 2020-06-06 DIAGNOSIS — G35A Relapsing-remitting multiple sclerosis: Secondary | ICD-10-CM

## 2020-06-06 DIAGNOSIS — G35 Multiple sclerosis: Secondary | ICD-10-CM | POA: Diagnosis not present

## 2020-06-06 DIAGNOSIS — I10 Essential (primary) hypertension: Secondary | ICD-10-CM

## 2020-06-06 MED ORDER — ACEBUTOLOL HCL 200 MG PO CAPS: 200.0000 mg | ORAL_CAPSULE | Freq: Every day | ORAL | 3 refills | Status: DC

## 2020-06-06 NOTE — Patient Instructions (Signed)
Medication Instructions:  Your physician has recommended you make the following change in your medication:  START: Acebutolol 200 mg take one tablet by mouth daily.  *If you need a refill on your cardiac medications before your next appointment, please call your pharmacy*   Lab Work: None If you have labs (blood work) drawn today and your tests are completely normal, you will receive your results only by: Marland Kitchen MyChart Message (if you have MyChart) OR . A paper copy in the mail If you have any lab test that is abnormal or we need to change your treatment, we will call you to review the results.   Testing/Procedures: None   Follow-Up: At [facility], you and your health needs are our priority.  As part of our continuing mission to provide you with exceptional heart care, we have created designated Provider Care Teams.  These Care Teams include your primary Cardiologist (physician) and Advanced Practice Providers (APPs -  Physician Assistants and Nurse Practitioners) who all work together to provide you with the care you need, when you need it.  We recommend signing up for the patient portal called "MyChart".  Sign up information is provided on this After Visit Summary.  MyChart is used to connect with patients for Virtual Visits (Telemedicine).  Patients are able to view lab/test results, encounter notes, upcoming appointments, etc.  Non-urgent messages can be sent to your provider as well.   To learn more about what you can do with MyChart, go to [email or web address].    Your next appointment:   6 week(s)  The format for your next appointment:   In Person  Provider:   Shirlee More, MD   Other Instructions Please purchase the Alive Cor Adapter for your smart phone and record your heart rate and rhythms twice daily.

## 2020-06-30 ENCOUNTER — Other Ambulatory Visit (HOSPITAL_COMMUNITY): Payer: Self-pay | Admitting: Neurology

## 2020-06-30 DIAGNOSIS — G35 Multiple sclerosis: Secondary | ICD-10-CM | POA: Diagnosis not present

## 2020-06-30 DIAGNOSIS — R5382 Chronic fatigue, unspecified: Secondary | ICD-10-CM | POA: Diagnosis not present

## 2020-07-01 ENCOUNTER — Other Ambulatory Visit (HOSPITAL_COMMUNITY): Payer: Self-pay | Admitting: Registered Nurse

## 2020-07-01 DIAGNOSIS — I119 Hypertensive heart disease without heart failure: Secondary | ICD-10-CM | POA: Diagnosis not present

## 2020-07-01 DIAGNOSIS — D72819 Decreased white blood cell count, unspecified: Secondary | ICD-10-CM | POA: Diagnosis not present

## 2020-07-01 DIAGNOSIS — G35 Multiple sclerosis: Secondary | ICD-10-CM | POA: Diagnosis not present

## 2020-07-01 DIAGNOSIS — G43009 Migraine without aura, not intractable, without status migrainosus: Secondary | ICD-10-CM | POA: Diagnosis not present

## 2020-07-01 DIAGNOSIS — Z6826 Body mass index (BMI) 26.0-26.9, adult: Secondary | ICD-10-CM | POA: Diagnosis not present

## 2020-07-03 ENCOUNTER — Other Ambulatory Visit (HOSPITAL_COMMUNITY): Payer: Self-pay | Admitting: Neurology

## 2020-07-09 ENCOUNTER — Other Ambulatory Visit (HOSPITAL_COMMUNITY): Payer: Self-pay | Admitting: Registered Nurse

## 2020-07-09 DIAGNOSIS — I119 Hypertensive heart disease without heart failure: Secondary | ICD-10-CM | POA: Diagnosis not present

## 2020-07-11 ENCOUNTER — Other Ambulatory Visit (HOSPITAL_COMMUNITY): Payer: Self-pay | Admitting: Registered Nurse

## 2020-07-11 ENCOUNTER — Emergency Department (HOSPITAL_COMMUNITY)
Admission: EM | Admit: 2020-07-11 | Discharge: 2020-07-11 | Disposition: A | Discharge status: Home / Self Care | Payer: PPO | Attending: Emergency Medicine | Admitting: Emergency Medicine

## 2020-07-11 ENCOUNTER — Telehealth: Payer: Self-pay | Admitting: Cardiology

## 2020-07-11 ENCOUNTER — Other Ambulatory Visit: Payer: Self-pay

## 2020-07-11 ENCOUNTER — Encounter (HOSPITAL_COMMUNITY): Payer: Self-pay | Admitting: Emergency Medicine

## 2020-07-11 DIAGNOSIS — I10 Essential (primary) hypertension: Secondary | ICD-10-CM | POA: Insufficient documentation

## 2020-07-11 DIAGNOSIS — Z5321 Procedure and treatment not carried out due to patient leaving prior to being seen by health care provider: Secondary | ICD-10-CM | POA: Insufficient documentation

## 2020-07-11 DIAGNOSIS — R519 Headache, unspecified: Secondary | ICD-10-CM | POA: Diagnosis present

## 2020-07-11 LAB — CBC
HCT: 45.2 % (ref 36.0–46.0)
Hemoglobin: 14.8 g/dL (ref 12.0–15.0)
MCH: 30.3 pg (ref 26.0–34.0)
MCHC: 32.7 g/dL (ref 30.0–36.0)
MCV: 92.4 fL (ref 80.0–100.0)
Platelets: 249 10*3/uL (ref 150–400)
RBC: 4.89 MIL/uL (ref 3.87–5.11)
RDW: 12.6 % (ref 11.5–15.5)
WBC: 8.2 10*3/uL (ref 4.0–10.5)
nRBC: 0 % (ref 0.0–0.2)

## 2020-07-11 LAB — BASIC METABOLIC PANEL
Anion gap: 10 (ref 5–15)
BUN: 13 mg/dL (ref 6–20)
CO2: 26 mmol/L (ref 22–32)
Calcium: 9.9 mg/dL (ref 8.9–10.3)
Chloride: 105 mmol/L (ref 98–111)
Creatinine, Ser: 0.92 mg/dL (ref 0.44–1.00)
GFR, Estimated: >60 mL/min (ref >60)
Glucose, Bld: 120 mg/dL — ABNORMAL HIGH (ref 70–99)
Potassium: 3.2 mmol/L — ABNORMAL LOW (ref 3.5–5.1)
Sodium: 141 mmol/L (ref 135–145)

## 2020-07-11 NOTE — Telephone Encounter (Signed)
Okay with me 

## 2020-07-11 NOTE — ED Triage Notes (Signed)
Pt here for HTN but unsure how long it has been elevated. States she has had headache for a month. Pt reports she has been taking her BP meds and more with no relief. Denies weakness or blurred viison.

## 2020-07-11 NOTE — Telephone Encounter (Signed)
Her only problem is SVT would be more appropriate for her to f/u with Dr Quentin Ore from EP

## 2020-07-11 NOTE — Telephone Encounter (Signed)
Patient would like to Transfer of Care from Dr. Bettina Gavia to Dr. Johnsie Cancel. Please confirm transfer.

## 2020-07-11 NOTE — ED Notes (Signed)
Pt called 3x no response  

## 2020-07-15 DIAGNOSIS — R252 Cramp and spasm: Secondary | ICD-10-CM | POA: Diagnosis not present

## 2020-07-15 DIAGNOSIS — G43009 Migraine without aura, not intractable, without status migrainosus: Secondary | ICD-10-CM | POA: Diagnosis not present

## 2020-07-15 DIAGNOSIS — Z6825 Body mass index (BMI) 25.0-25.9, adult: Secondary | ICD-10-CM | POA: Diagnosis not present

## 2020-07-15 DIAGNOSIS — I119 Hypertensive heart disease without heart failure: Secondary | ICD-10-CM | POA: Diagnosis not present

## 2020-07-22 ENCOUNTER — Other Ambulatory Visit (HOSPITAL_COMMUNITY): Payer: Self-pay | Admitting: Neurology

## 2020-07-23 DIAGNOSIS — R945 Abnormal results of liver function studies: Secondary | ICD-10-CM | POA: Diagnosis not present

## 2020-07-23 DIAGNOSIS — R748 Abnormal levels of other serum enzymes: Secondary | ICD-10-CM | POA: Diagnosis not present

## 2020-07-23 DIAGNOSIS — R7401 Elevation of levels of liver transaminase levels: Secondary | ICD-10-CM | POA: Diagnosis not present

## 2020-07-24 ENCOUNTER — Ambulatory Visit: Payer: PPO | Admitting: Cardiology

## 2020-07-28 ENCOUNTER — Other Ambulatory Visit (HOSPITAL_COMMUNITY): Payer: Self-pay | Admitting: Registered Nurse

## 2020-08-06 DIAGNOSIS — N3000 Acute cystitis without hematuria: Secondary | ICD-10-CM | POA: Diagnosis not present

## 2020-08-06 DIAGNOSIS — Z6826 Body mass index (BMI) 26.0-26.9, adult: Secondary | ICD-10-CM | POA: Diagnosis not present

## 2020-09-01 DIAGNOSIS — H2513 Age-related nuclear cataract, bilateral: Secondary | ICD-10-CM | POA: Insufficient documentation

## 2020-09-01 DIAGNOSIS — H524 Presbyopia: Secondary | ICD-10-CM | POA: Diagnosis not present

## 2020-09-01 DIAGNOSIS — H04123 Dry eye syndrome of bilateral lacrimal glands: Secondary | ICD-10-CM | POA: Insufficient documentation

## 2020-09-01 DIAGNOSIS — G35 Multiple sclerosis: Secondary | ICD-10-CM | POA: Diagnosis not present

## 2020-09-15 ENCOUNTER — Other Ambulatory Visit (HOSPITAL_COMMUNITY): Payer: Self-pay | Admitting: Neurology

## 2020-09-15 DIAGNOSIS — G43019 Migraine without aura, intractable, without status migrainosus: Secondary | ICD-10-CM | POA: Diagnosis not present

## 2020-09-16 ENCOUNTER — Other Ambulatory Visit (HOSPITAL_COMMUNITY): Payer: Self-pay | Admitting: Family Medicine

## 2020-10-01 ENCOUNTER — Other Ambulatory Visit (HOSPITAL_COMMUNITY): Payer: Self-pay | Admitting: Registered Nurse

## 2020-10-02 DIAGNOSIS — M545 Low back pain, unspecified: Secondary | ICD-10-CM | POA: Diagnosis not present

## 2020-10-14 DIAGNOSIS — G35 Multiple sclerosis: Secondary | ICD-10-CM | POA: Diagnosis not present

## 2020-10-14 DIAGNOSIS — M545 Low back pain, unspecified: Secondary | ICD-10-CM | POA: Diagnosis not present

## 2020-10-16 ENCOUNTER — Other Ambulatory Visit (HOSPITAL_COMMUNITY): Payer: Self-pay | Admitting: Registered Nurse

## 2020-10-16 DIAGNOSIS — R059 Cough, unspecified: Secondary | ICD-10-CM | POA: Diagnosis not present

## 2020-10-16 DIAGNOSIS — Z20828 Contact with and (suspected) exposure to other viral communicable diseases: Secondary | ICD-10-CM | POA: Diagnosis not present

## 2020-11-04 ENCOUNTER — Other Ambulatory Visit (HOSPITAL_COMMUNITY): Payer: Self-pay | Admitting: Neurology

## 2020-11-06 ENCOUNTER — Other Ambulatory Visit: Payer: Self-pay

## 2020-11-06 ENCOUNTER — Ambulatory Visit: Payer: PPO | Admitting: Adult Health

## 2020-11-06 ENCOUNTER — Encounter: Payer: Self-pay | Admitting: Adult Health

## 2020-11-06 DIAGNOSIS — G35 Multiple sclerosis: Secondary | ICD-10-CM

## 2020-11-06 DIAGNOSIS — J4530 Mild persistent asthma, uncomplicated: Secondary | ICD-10-CM

## 2020-11-06 DIAGNOSIS — R918 Other nonspecific abnormal finding of lung field: Secondary | ICD-10-CM

## 2020-11-06 NOTE — Assessment & Plan Note (Signed)
Currently well controlled.  Patient is continue on current regimen.  Activity as tolerated  Plan  Patient Instructions  Albuterol inhaler As needed  Wheezing/shortness of breath .  Follow up with Dr. Elsworth Soho or Isair Inabinet NP in 1 year and As needed   Please contact office for sooner follow up if symptoms do not improve or worsen or seek emergency care

## 2020-11-06 NOTE — Patient Instructions (Signed)
Albuterol inhaler As needed  Wheezing/shortness of breath .  Follow up with Dr. Elsworth Soho or Billey Wojciak NP in 1 year and As needed   Please contact office for sooner follow up if symptoms do not improve or worsen or seek emergency care

## 2020-11-06 NOTE — Assessment & Plan Note (Signed)
Has been stable on serial CT scan.  Consistent with benign etiology.  No further imaging at this time.

## 2020-11-06 NOTE — Assessment & Plan Note (Signed)
Appears currently stable continue follow-up with neurology

## 2020-11-06 NOTE — Progress Notes (Signed)
@Patient  ID: Cynthia Nichols, female    DOB: [birth year], 60 y.o.   MRN: [id number]  Chief Complaint  Patient presents with  . Follow-up    Referring provider: Farrel Conners*  HPI: 60 year old female never smoker followed for abnormal chest x-ray with pulmonary nodules and asthma Medical history significant for MS  TEST/EVENTS :  12/ 2018spirometrywith PCPapparently showed a mixture of obstruction and restriction 09/2017 spirometry shows mild restriction with ratio 77, FEV1 72% FVC 74%  11/06/20  Follow up : Pulmonary nodules and Asthma  Patient returns for a 1 year follow-up.  Patient says since last visit she has been doing okay with her breathing.  She has had COVID-19 infection x2.  She denies any increased cough or wheezing.  She is not on any maintenance inhaler.   Last year was having some dry cough.  Was recommended that ACE inhibitor could be contributing to her cough.  She says this was discontinued by her primary care provider.  Cough went totally away.  Had Covid 19 last month , had cough and dyspnea, did use Albuterol which helped. Cough has resolved now.   Patient has been noted to have pulmonary nodules on previous CT scans.  CT chest December 2018 showed patchy opacities right greater than left compatible with mild granulomatous and scattered nodules millimeter.  CT chest July 2019 showed no change in biapical nodules or opacities.  Patient is a never smoker.  Felt to be consistent with a benign etiology.  Patient does have underlying MS.  Says overall has been doing okay  Off maintenance meds. Muscle strength is weak at times.   Chest x-ray November 02, 2019 showed mild apical scarring no opacities noted.  No adenopathy.    Allergies  Allergen Reactions  . Ampyra [Dalfampridine] Anaphylaxis and Shortness Of Breath    Chest pain, also   . Imitrex [Sumatriptan] Other (See Comments)    Chest tightness  . Crestor [Rosuvastatin Calcium] Other (See  Comments)    Abdominal pain, shortness of breath  . Peanut-Containing Drug Products Itching    Itching in the mouth (remarked that she still eats this in limited amounts)  . Shrimp [Shellfish Allergy] Itching    Itching in the mouth (remarked that she still eats this in limited amounts)    Immunization History  Administered Date(s) Administered  . Influenza,inj,Quad PF,6+ Mos 06/24/2017  . Influenza-Unspecified 06/20/2017  . PFIZER(Purple Top)SARS-COV-2 Vaccination 01/03/2020, 01/30/2020    Past Medical History:  Diagnosis Date  . Adopted    Patient has no known family history  . 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 Aortic atherosclerosis ([place]) 11/29/2017  . Asthma   . Asthma 11/02/2019  . Closed fracture of shaft of right humerus 05/13/2017  . Closed low lateral malleolus fracture, left, initial encounter 10/17/2018  . 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 Essential hypertension, benign 11/29/2017  . Fracture of fifth metatarsal bone 08/13/2015  . Greater trochanteric bursitis, left 12/31/2019   Injection given December 31, 2019  . Headache(784.0)   . 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 Hypertension   . IBS (irritable bowel syndrome)   . Migraine without aura and without status migrainosus, not intractable 02/09/2018  . MS (multiple sclerosis) ([place])   . Multiple sclerosis, relapsing-remitting ([place]) 12/14/2011  . Osteoporosis 11/29/2017  . Other fracture of shaft of right humerus, initial encounter for closed fracture 05/13/2017  . Patella fracture 07/03/2015   Hairline  oblique    . Patellar subluxation, left, initial encounter 08/14/2018  . PONV (postoperative nausea and vomiting)   . 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 Unspecified venous (peripheral) insufficiency 11/09/2011  . Venous aneurysm    Right side of neck    Tobacco History: Social History   Tobacco Use  Smoking Status  Never Smoker  Smokeless Tobacco Never Used   Counseling given: Not Answered   Outpatient Medications Prior to Visit  Medication Sig Dispense Refill  . acebutolol (SECTRAL) 200 MG capsule Take 1 capsule (200 mg total) by mouth daily. 90 capsule 3  . albuterol (VENTOLIN HFA) 108 (90 Base) MCG/ACT inhaler Inhale into the lungs every 6 (six) hours as needed.     . AMBULATORY NON FORMULARY MEDICATION Left sided AFO 1 Units 0  . bisoprolol (ZEBETA) 5 MG tablet Take by mouth.    . Cholecalciferol (VITAMIN D) 50 MCG (2000 UT) CAPS Take 5,000 Units by mouth daily.    . DULoxetine (CYMBALTA) 60 MG capsule Take 60 mg by mouth at bedtime.    Marland Kitchen Galcanezumab-gnlm (EMGALITY) 120 MG/ML SOAJ Inject into the skin.    . hydrochlorothiazide (HYDRODIURIL) 12.5 MG tablet Take by mouth.    . naratriptan (AMERGE) 2.5 MG tablet Take 2.5 mg by mouth as needed.     . pravastatin (PRAVACHOL) 20 MG tablet Take 20 mg by mouth daily.    . vitamin C (ASCORBIC ACID) 500 MG tablet Take 500 mg by mouth daily.    . Zinc 50 MG CAPS Take 50 mg by mouth daily.    Marland Kitchen zolpidem (AMBIEN CR) 6.25 MG CR tablet Take 6.25 mg by mouth at bedtime.    Marland Kitchen zonisamide (ZONEGRAN) 100 MG capsule Take 200 mg by mouth at bedtime.    Marland Kitchen lisinopril (ZESTRIL) 40 MG tablet Take 1 tablet (40 mg total) by mouth at bedtime. (Patient not taking: Reported on 11/06/2020) 3090 tablet 1  . Siponimod Fumarate (MAYZENT) 0.25 MG TABS Take 2 mg by mouth daily. (Patient not taking: Reported on 11/06/2020)     No facility-administered medications prior to visit.     Review of Systems:   Constitutional:   No  weight loss, night sweats,  Fevers, chills, fatigue, or  lassitude.  HEENT:   No headaches,  Difficulty swallowing,  Tooth/dental problems, or  Sore throat,                No sneezing, itching, ear ache, nasal congestion, post nasal drip,   CV:  No chest pain,  Orthopnea, PND, swelling in lower extremities, anasarca, dizziness, palpitations, syncope.    GI  No heartburn, indigestion, abdominal pain, nausea, vomiting, diarrhea, change in bowel habits, loss of appetite, bloody stools.   Resp: No shortness of breath with exertion or at rest.  No excess mucus, no productive cough,  No non-productive cough,  No coughing up of blood.  No change in color of mucus.  No wheezing.  No chest wall deformity  Skin: no rash or lesions.  GU: no dysuria, change in color of urine, no urgency or frequency.  No flank pain, no hematuria   MS: General weakness    Physical Exam  BP 90/60 (BP Location: Left Arm, Patient Position: Sitting, Cuff Size: Normal)   Pulse 88   Temp 97.6 F (36.4 C) (Temporal)   SpO2 100%   GEN: A/Ox3; pleasant , NAD, well nourished    HEENT:  [place]/AT,  , NOSE-clear, THROAT-clear, no lesions, no postnasal drip or exudate noted.   NECK:  Supple w/ fair ROM; no JVD; normal carotid impulses w/o bruits; no thyromegaly or nodules palpated; no lymphadenopathy.    RESP  Clear  P & A; w/o, wheezes/ rales/ or rhonchi. no accessory muscle use, no dullness to percussion  CARD:  RRR, no m/r/g, no peripheral edema, pulses intact, no cyanosis or clubbing.  GI:   Soft & nt; nml bowel sounds; no organomegaly or masses detected.   Musco: Warm bil, no deformities or joint swelling noted.   Neuro: alert, no focal deficits noted.    Skin: Warm, no lesions or rashes     BNP No results found for: BNP  ProBNP No results found for: PROBNP  Imaging: No results found.    No flowsheet data found.  No results found for: NITRICOXIDE      Assessment & Plan:   Asthma Currently well controlled.  Patient is continue on current regimen.  Activity as tolerated  Plan  Patient Instructions  Albuterol inhaler As needed  Wheezing/shortness of breath .  Follow up with Dr. Elsworth Soho or Angeliz Settlemyre NP in 1 year and As needed   Please contact office for sooner follow up if symptoms do not improve or worsen or seek emergency care       MS  (multiple sclerosis) ([place]) Appears currently stable continue follow-up with neurology  Pulmonary nodules Has been stable on serial CT scan.  Consistent with benign etiology.  No further imaging at this time.     Rexene Edison, NP 11/06/2020

## 2020-12-02 DIAGNOSIS — H2513 Age-related nuclear cataract, bilateral: Secondary | ICD-10-CM | POA: Diagnosis not present

## 2020-12-17 ENCOUNTER — Other Ambulatory Visit (HOSPITAL_COMMUNITY): Payer: Self-pay | Admitting: Registered Nurse

## 2021-01-01 ENCOUNTER — Other Ambulatory Visit (HOSPITAL_COMMUNITY): Payer: Self-pay

## 2021-01-01 MED ORDER — BISOPROLOL FUMARATE 5 MG PO TABS
2.5000 mg | ORAL_TABLET | Freq: Every day | ORAL | 2 refills | Status: DC
  Filled 2021-01-01: qty 30, 30d supply, fill #0
  Filled 2021-02-16: qty 30, 30d supply, fill #1
  Filled 2021-04-14: qty 30, 30d supply, fill #2

## 2021-01-07 DIAGNOSIS — G35 Multiple sclerosis: Secondary | ICD-10-CM | POA: Diagnosis not present

## 2021-01-07 DIAGNOSIS — M545 Low back pain, unspecified: Secondary | ICD-10-CM | POA: Diagnosis not present

## 2021-01-08 DIAGNOSIS — D72819 Decreased white blood cell count, unspecified: Secondary | ICD-10-CM | POA: Diagnosis not present

## 2021-01-08 DIAGNOSIS — M5416 Radiculopathy, lumbar region: Secondary | ICD-10-CM | POA: Diagnosis not present

## 2021-01-08 DIAGNOSIS — Z79899 Other long term (current) drug therapy: Secondary | ICD-10-CM | POA: Diagnosis not present

## 2021-01-08 DIAGNOSIS — R5383 Other fatigue: Secondary | ICD-10-CM | POA: Diagnosis not present

## 2021-01-08 DIAGNOSIS — R5381 Other malaise: Secondary | ICD-10-CM | POA: Diagnosis not present

## 2021-01-08 DIAGNOSIS — G35 Multiple sclerosis: Secondary | ICD-10-CM | POA: Diagnosis not present

## 2021-01-08 DIAGNOSIS — I119 Hypertensive heart disease without heart failure: Secondary | ICD-10-CM | POA: Diagnosis not present

## 2021-01-08 DIAGNOSIS — Z6828 Body mass index (BMI) 28.0-28.9, adult: Secondary | ICD-10-CM | POA: Diagnosis not present

## 2021-01-13 ENCOUNTER — Other Ambulatory Visit (HOSPITAL_COMMUNITY): Payer: Self-pay

## 2021-01-13 MED ORDER — ERGOCALCIFEROL 1.25 MG (50000 UT) PO CAPS
1.0000 | ORAL_CAPSULE | ORAL | 0 refills | Status: DC
  Filled 2021-01-13: qty 12, 84d supply, fill #0

## 2021-01-14 ENCOUNTER — Other Ambulatory Visit (HOSPITAL_COMMUNITY): Payer: Self-pay

## 2021-01-14 MED FILL — Galcanezumab-gnlm Subcutaneous Soln Auto-Injector 120 MG/ML: SUBCUTANEOUS | 28 days supply | Qty: 1 | Fill #0 | Status: AC

## 2021-01-14 MED FILL — Pravastatin Sodium Tab 20 MG: ORAL | 30 days supply | Qty: 30 | Fill #0 | Status: AC

## 2021-01-15 ENCOUNTER — Other Ambulatory Visit (HOSPITAL_COMMUNITY): Payer: Self-pay

## 2021-01-15 MED ORDER — ZOLPIDEM TARTRATE ER 6.25 MG PO TBCR
6.2500 mg | EXTENDED_RELEASE_TABLET | Freq: Every day | ORAL | 3 refills | Status: DC
  Filled 2021-01-15: qty 30, 30d supply, fill #0
  Filled 2021-02-16: qty 30, 30d supply, fill #1
  Filled 2021-03-15 – 2021-03-16 (×3): qty 30, 30d supply, fill #2
  Filled 2021-04-14: qty 30, 30d supply, fill #3

## 2021-01-20 DIAGNOSIS — G35 Multiple sclerosis: Secondary | ICD-10-CM | POA: Diagnosis not present

## 2021-01-20 DIAGNOSIS — M545 Low back pain, unspecified: Secondary | ICD-10-CM | POA: Diagnosis not present

## 2021-01-21 ENCOUNTER — Other Ambulatory Visit (HOSPITAL_COMMUNITY): Payer: Self-pay

## 2021-01-21 DIAGNOSIS — I10 Essential (primary) hypertension: Secondary | ICD-10-CM | POA: Diagnosis not present

## 2021-01-21 DIAGNOSIS — Z6828 Body mass index (BMI) 28.0-28.9, adult: Secondary | ICD-10-CM | POA: Diagnosis not present

## 2021-01-21 DIAGNOSIS — R600 Localized edema: Secondary | ICD-10-CM | POA: Diagnosis not present

## 2021-01-21 DIAGNOSIS — F411 Generalized anxiety disorder: Secondary | ICD-10-CM | POA: Diagnosis not present

## 2021-01-21 MED ORDER — DULOXETINE HCL 30 MG PO CPEP
30.0000 mg | ORAL_CAPSULE | Freq: Every day | ORAL | 0 refills | Status: DC
  Filled 2021-01-21: qty 14, 14d supply, fill #0

## 2021-01-21 MED ORDER — DULOXETINE HCL 20 MG PO CPEP
20.0000 mg | ORAL_CAPSULE | ORAL | 0 refills | Status: DC
  Filled 2021-01-21: qty 21, 28d supply, fill #0

## 2021-01-21 MED ORDER — DULOXETINE HCL 20 MG PO CPEP
20.0000 mg | ORAL_CAPSULE | ORAL | 0 refills | Status: DC
  Filled 2021-01-21: qty 21, 42d supply, fill #0

## 2021-01-21 MED ORDER — HYDROXYZINE PAMOATE 25 MG PO CAPS
25.0000 mg | ORAL_CAPSULE | Freq: Three times a day (TID) | ORAL | 3 refills | Status: DC | PRN
  Filled 2021-01-21: qty 180, 30d supply, fill #0

## 2021-02-02 ENCOUNTER — Other Ambulatory Visit (HOSPITAL_COMMUNITY): Payer: Self-pay

## 2021-02-02 DIAGNOSIS — G35 Multiple sclerosis: Secondary | ICD-10-CM | POA: Diagnosis not present

## 2021-02-02 DIAGNOSIS — M545 Low back pain, unspecified: Secondary | ICD-10-CM | POA: Diagnosis not present

## 2021-02-02 MED ORDER — CANDESARTAN CILEXETIL 8 MG PO TABS
8.0000 mg | ORAL_TABLET | Freq: Every day | ORAL | 1 refills | Status: AC
  Filled 2021-02-02: qty 90, 90d supply, fill #0

## 2021-02-02 MED FILL — Zonisamide Cap 100 MG: ORAL | 90 days supply | Qty: 180 | Fill #0 | Status: AC

## 2021-02-06 MED FILL — Galcanezumab-gnlm Subcutaneous Soln Auto-Injector 120 MG/ML: SUBCUTANEOUS | 28 days supply | Qty: 1 | Fill #1 | Status: AC

## 2021-02-09 ENCOUNTER — Other Ambulatory Visit (HOSPITAL_COMMUNITY): Payer: Self-pay

## 2021-02-11 ENCOUNTER — Ambulatory Visit: Payer: PPO | Admitting: Neurology

## 2021-02-16 MED FILL — Pravastatin Sodium Tab 20 MG: ORAL | 30 days supply | Qty: 30 | Fill #1 | Status: AC

## 2021-02-17 ENCOUNTER — Encounter: Payer: Self-pay | Admitting: Neurology

## 2021-02-17 ENCOUNTER — Other Ambulatory Visit (HOSPITAL_COMMUNITY): Payer: Self-pay

## 2021-02-17 ENCOUNTER — Other Ambulatory Visit: Payer: Self-pay

## 2021-02-17 ENCOUNTER — Ambulatory Visit: Payer: PPO | Admitting: Neurology

## 2021-02-17 VITALS — BP 132/81 | HR 73 | Ht 67.0 in | Wt 180.0 lb

## 2021-02-17 DIAGNOSIS — Z79899 Other long term (current) drug therapy: Secondary | ICD-10-CM | POA: Diagnosis not present

## 2021-02-17 DIAGNOSIS — F988 Other specified behavioral and emotional disorders with onset usually occurring in childhood and adolescence: Secondary | ICD-10-CM

## 2021-02-17 DIAGNOSIS — R269 Unspecified abnormalities of gait and mobility: Secondary | ICD-10-CM | POA: Diagnosis not present

## 2021-02-17 DIAGNOSIS — N3941 Urge incontinence: Secondary | ICD-10-CM | POA: Insufficient documentation

## 2021-02-17 DIAGNOSIS — G35 Multiple sclerosis: Secondary | ICD-10-CM

## 2021-02-17 MED ORDER — AMPHETAMINE-DEXTROAMPHETAMINE 10 MG PO TABS
10.0000 mg | ORAL_TABLET | Freq: Two times a day (BID) | ORAL | 0 refills | Status: DC
  Filled 2021-02-17 – 2021-02-23 (×3): qty 60, 30d supply, fill #0

## 2021-02-17 MED ORDER — MIRABEGRON ER 50 MG PO TB24
50.0000 mg | ORAL_TABLET | Freq: Every day | ORAL | 11 refills | Status: DC
  Filled 2021-02-17: qty 30, 30d supply, fill #0

## 2021-02-17 NOTE — Progress Notes (Signed)
[facility]  PATIENT: Cynthia Nichols DOB: Apr 21, 1961  REFERRING DOCTOR OR PCP: Darlin Priestly, MD ([place]); Clydie Braun, FNP (PCP) SOURCE: Patient, notes from neurology, imaging and lab reports.  _________________________________   HISTORICAL  CHIEF COMPLAINT:  Chief Complaint  Patient presents with  . New Patient (Initial Visit)  . Multiple Sclerosis    Room 13, alone in room    HISTORY OF PRESENT ILLNESS:  I had the pleasure of seeing your patient, Cynthia Nichols, at [facility] for neurologic consultation regarding her multiple sclerosis.  She had no other exacerbations.     She is a 60 year old woman who had the onset of right leg weakness one day in 2011.  Symptoms did not improve.  However, she began to have bladder issues a few years earlier.    She initially was seen at [facility].  MRI of the cervical spine 11/12/2009 showed only the one spot at [facility].   She had EP studies (normal) and CSF analysis which was reporgtedly c/w MS.   She was placed on Copaxone.  She had episodes of diplopia which were fleeting.   She was switched to Tecfidera bit had trouble tolerating it.   After several years, she switched to Tysabri.   She felt great after the first dose but felt poorly after the second dose so she stopped.   About 2 years ago, she started Mayzent but stopped after 3 months due to feeling poorly.   Additionally, she had BP changes so she stopped.   She has not been on any DMT since she stopped in 2020.   She feels she has been mostly stable off the DMTs.   However, gait has slowly worsened.  Additionally she has more fatigue.    Currently, she is walking poorly due to right leg weakness.   She has an AFO for the right leg and uses two canes or a walker.   She goes short distances without support.  She denies much spasticity in her right leg.   She has numbness and tingling in her feet.  She has urge incontinence.   Oxybutynin was poorly  tolerated.    She has fatigue.  In the past she was on Provigil without benefit.   Adderall (10 mg)  helped her but she has not been on x 2-3 years.  She sleeps poorly.  Ambien CR 6.25 mg helps her fall asleep but she has some sleep maintenance issues.    She has anxiety > depression.   She notes has cognitive issue.  Word finding is difficult at times.   She has trouble with parallel processing.     She has LBP, worse with prolonged standing.    MRI in 2018 was normal by report.   She broke her right humerus in a fall in 2018.     She has migraine headaches well controlled on zonisamide and Emgality,      Imaging reports (actual images have been requested but were not available at the time of the visit): MRI of the brain report 06/14/2019 "No significant change nonenhancing white matter foci compatible with multiple sclerosis. No new lesion"  MRI of the cervical spine 06/14/2019 report "T2 signal: Dorsal lateral right at C1 stable.Right ventrolateral. C4 Which may or may not have been present previously. Bilateral ventral cord left greater than right as seen previously adjacent to extrusion as per below, question right progression versus artifact... Right lateral C6 stable.. Large longitudinal focus  left  T2 stable on sagittal images.    Degenerative change with a large disc osteophyte complex at C5-C6 to the right with moderate bilateral foraminal narrowing and moderate spinal stenosis.  milder problems at other levels.   REVIEW OF SYSTEMS: Constitutional: No fevers, chills, sweats, or change in appetite Eyes: No visual changes, double vision, eye pain Ear, nose and throat: No hearing loss, ear pain, nasal congestion, sore throat Cardiovascular: No chest pain, palpitations Respiratory: No shortness of breath at rest or with exertion.   No wheezes GastrointestinaI: No nausea, vomiting, diarrhea, abdominal pain, fecal incontinence Genitourinary: No dysuria, urinary retention or frequency.  No  nocturia. Musculoskeletal: No neck pain, back pain Integumentary: No rash, pruritus, skin lesions Neurological: as above Psychiatric: No depression at this time.  No anxiety Endocrine: No palpitations, diaphoresis, change in appetite, change in weigh or increased thirst Hematologic/Lymphatic: No anemia, purpura, petechiae. Allergic/Immunologic: No itchy/runny eyes, nasal congestion, recent allergic reactions, rashes  ALLERGIES: Allergies  Allergen Reactions  . Ampyra [Dalfampridine] Anaphylaxis and Shortness Of Breath    Chest pain, also   . Imitrex [Sumatriptan] Other (See Comments)    Chest tightness  . Crestor [Rosuvastatin Calcium] Other (See Comments)    Abdominal pain, shortness of breath  . Peanut-Containing Drug Products Itching    Itching in the mouth (remarked that she still eats this in limited amounts)  . Shrimp [Shellfish Allergy] Itching    Itching in the mouth (remarked that she still eats this in limited amounts)    HOME MEDICATIONS:  Current Outpatient Medications:  .  albuterol (PROVENTIL) (2.5 MG/3ML) 0.083% nebulizer solution, USE 1 VIAL VIA NEBULIZER EVERY 4 TO 6 HOURS AS NEEDED FOR SHORTNESS OF BREATH /WHEEZING, Disp: 225 mL, Rfl: 0 .  amphetamine-dextroamphetamine (ADDERALL) 10 MG tablet, Take 1 tablet (10 mg total) by mouth 2 (two) times daily., Disp: 60 tablet, Rfl: 0 .  bisoprolol (ZEBETA) 5 MG tablet, TAKE 1/2-1 TABLET BY MOUTH DAILY FOR ELEVATED BLOOD PRESSURE, Disp: 30 tablet, Rfl: 2 .  candesartan (ATACAND) 8 MG tablet, Take 1 tablet (8 mg total) by mouth daily., Disp: 90 tablet, Rfl: 1 .  Cholecalciferol (VITAMIN D) 50 MCG (2000 UT) CAPS, Take 5,000 Units by mouth daily., Disp: , Rfl:  .  DULoxetine (CYMBALTA) 30 MG capsule, Take 30 mg by mouth at bedtime., Disp: , Rfl:  .  ergocalciferol (VITAMIN D2) 1.25 MG (50000 UT) capsule, Take 1 capsule (50,000 Units total) by mouth once a week., Disp: 24 capsule, Rfl: 0 .  Galcanezumab-gnlm (EMGALITY) 120  MG/ML SOAJ, Inject into the skin., Disp: , Rfl:  .  hydrochlorothiazide (HYDRODIURIL) 12.5 MG tablet, Take by mouth., Disp: , Rfl:  .  hydrOXYzine (VISTARIL) 25 MG capsule, Take 1-2 capsules (25 mg total) by mouth every 8 (eight) hours as needed., Disp: 180 capsule, Rfl: 3 .  mirabegron ER (MYRBETRIQ) 50 MG TB24 tablet, Take 1 tablet (50 mg total) by mouth daily., Disp: 30 tablet, Rfl: 11 .  naratriptan (AMERGE) 2.5 MG tablet, Take 2.5 mg by mouth as needed. , Disp: , Rfl:  .  pravastatin (PRAVACHOL) 20 MG tablet, Take 20 mg by mouth daily., Disp: , Rfl:  .  vitamin C (ASCORBIC ACID) 500 MG tablet, Take 500 mg by mouth daily., Disp: , Rfl:  .  Zinc 50 MG CAPS, Take 50 mg by mouth daily., Disp: , Rfl:  .  zolpidem (AMBIEN CR) 6.25 MG CR tablet, Take 1 tablet by mouth at bedtime, Disp: 30 tablet, Rfl:  3 .  zonisamide (ZONEGRAN) 100 MG capsule, TAKE 1 CAPSULE (100 MG TOTAL) BY MOUTH DAILY AFTER DINNER ., Disp: 90 capsule, Rfl: 0 .  acebutolol (SECTRAL) 200 MG capsule, Take 1 capsule (200 mg total) by mouth daily., Disp: 90 capsule, Rfl: 3 .  albuterol (VENTOLIN HFA) 108 (90 Base) MCG/ACT inhaler, Inhale into the lungs every 6 (six) hours as needed. , Disp: , Rfl:  .  AMBULATORY NON FORMULARY MEDICATION, Left sided AFO, Disp: 1 Units, Rfl: 0 .  bisoprolol (ZEBETA) 5 MG tablet, Take by mouth., Disp: , Rfl:  .  bisoprolol (ZEBETA) 5 MG tablet, TAKE 1/2 TO 1 TABLET DAILY FOR ELEVATED BLOOD PRESSURE, Disp: 30 tablet, Rfl: 2 .  DULoxetine (CYMBALTA) 20 MG capsule, Take 1 (one) Capsule by mouth every day for 2 weeks and then every other day for 2 weeks, Disp: 21 capsule, Rfl: 0 .  DULoxetine (CYMBALTA) 30 MG capsule, Take 1 capsule (30 mg total) by mouth daily for 14 days., Disp: 14 capsule, Rfl: 0 .  DULoxetine (CYMBALTA) 60 MG capsule, TAKE 2 CAPSULES BY MOUTH NIGHTLY, Disp: 60 capsule, Rfl: 2 .  DULoxetine (CYMBALTA) 60 MG capsule, TAKE 2 CAPSULES BY MOUTH NIGHTLY, Disp: 94 capsule, Rfl: 0 .   Galcanezumab-gnlm 120 MG/ML SOAJ, INJECT 120 MG INTO THE SKIN EVERY 28 DAYS., Disp: 1 mL, Rfl: 6 .  Galcanezumab-gnlm 120 MG/ML SOAJ, INJECT 240 MG SUBCUTANEOUSLY EVERY 28 (TWENTY-EIGHT) DAYS, Disp: 2 mL, Rfl: 0 .  hydrochlorothiazide (MICROZIDE) 12.5 MG capsule, TAKE 1 TO 2 CAPSULES BY MOUTH DAILY FOR BLOOD PRESSURE, Disp: 60 capsule, Rfl: 1 .  hydrochlorothiazide (MICROZIDE) 12.5 MG capsule, TAKE 1 OR 2 CAPSULES BY MOUTH DAILY FOR BLOOD PRESSURE, Disp: 60 capsule, Rfl: 1 .  methylPREDNISolone (MEDROL DOSEPAK) 4 MG TBPK tablet, TAKE ALL 6 TABLETS BY MOUTH ON DAY 1, THEN DECREASE BY ONE TABLET EACH DAY (6-5-4-3-2-1), Disp: 21 each, Rfl: 0 .  pravastatin (PRAVACHOL) 20 MG tablet, TAKE 1 TABLET BY MOUTH DAILY, Disp: 30 tablet, Rfl: 6 .  pravastatin (PRAVACHOL) 20 MG tablet, TAKE 1 TABLET BY MOUTH DAILY, Disp: 30 tablet, Rfl: 6 .  predniSONE (DELTASONE) 10 MG tablet, DAYS 1-2: TAKE 4 TABLETS BY MOUTH. DAYS 3-4: TAKE 3 TABS DAILY. DAYS 5-6: TAKE 2 TABS DAILY. DAYS 7-8: TAKE 1 TAB DAILY., Disp: 20 tablet, Rfl: 0 .  zolpidem (AMBIEN CR) 6.25 MG CR tablet, Take 6.25 mg by mouth at bedtime., Disp: , Rfl:  .  zolpidem (AMBIEN CR) 6.25 MG CR tablet, TAKE 1 TABLET BY MOUTH AT BEDTIME, Disp: 30 tablet, Rfl: 3 .  zonisamide (ZONEGRAN) 100 MG capsule, Take 200 mg by mouth at bedtime., Disp: , Rfl:  .  zonisamide (ZONEGRAN) 100 MG capsule, TAKE 2 CAPSULES BY MOUTH AT BEDTIME AFTER USING 25MG  CAPS TO INCREASE GRADUALLY, Disp: 180 capsule, Rfl: 6  PAST MEDICAL HISTORY: Past Medical History:  Diagnosis Date  . Adopted    Patient has no known family history  . 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 Aortic atherosclerosis ([place]) 11/29/2017  . Asthma   . Asthma 11/02/2019  . Closed fracture of shaft of right humerus 05/13/2017  . Closed low lateral malleolus fracture, left, initial encounter 10/17/2018  . 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 Essential hypertension, benign 11/29/2017  . Fracture of fifth  metatarsal bone 08/13/2015  . Greater trochanteric bursitis, left 12/31/2019   Injection given December 31, 2019  . Headache(784.0)   . Humerus fracture  right with radial nerve palsy  . Hypertension   . IBS (irritable bowel syndrome)   . Migraine without aura and without status migrainosus, not intractable 02/09/2018  . MS (multiple sclerosis) ([place])   . Multiple sclerosis, relapsing-remitting ([place]) 12/14/2011  . Osteoporosis 11/29/2017  . Other fracture of shaft of right humerus, initial encounter for closed fracture 05/13/2017  . Patella fracture 07/03/2015   Hairline oblique    . Patellar subluxation, left, initial encounter 08/14/2018  . PONV (postoperative nausea and vomiting)   . 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 Unspecified venous (peripheral) insufficiency 11/09/2011  . Venous aneurysm    Right side of neck    PAST SURGICAL HISTORY: Past Surgical History:  Procedure Laterality Date  . ABDOMINAL HYSTERECTOMY    . BREAST MASS EXCISION     Bilateral breast mass excision ( Benign)  . ORIF HUMERUS FRACTURE Right 05/17/2017  . TENNIS ELBOW RELEASE/NIRSCHEL PROCEDURE Right 05/17/2017   Procedure: EXPLORATION AND EXPOSURE OF RADIAL NERVE RIGHT ARM;  Surgeon: Roseanne Kaufman, MD;  Location: [facility];  Service: Orthopedics;  Laterality: Right;    FAMILY HISTORY: Family History  Adopted: Yes    SOCIAL HISTORY:  Social History   Socioeconomic History  . Marital status: Married    Spouse name: Ayannah Faddis  . Number of children: 3  . Years of education: Not on file  . Highest education level: Associate degree: occupational, Hotel manager, or vocational program  Occupational History  . Occupation: disabled    Comment: Therapist, sports  Tobacco Use  . Smoking status: Never Smoker  . Smokeless tobacco: Never Used  Vaping Use  . Vaping Use: Never used  Substance and Sexual Activity  . Alcohol use: No    Alcohol/week: 0.0 standard  drinks  . Drug use: No  . Sexual activity: Yes    Partners: Male  Other Topics Concern  . Not on file  Social History Narrative  . Not on file   Social Determinants of Health   Financial Resource Strain: Not on file  Food Insecurity: Not on file  Transportation Needs: Not on file  Physical Activity: Not on file  Stress: Not on file  Social Connections: Not on file  Intimate Partner Violence: Not on file     PHYSICAL EXAM  Vitals:   02/17/21 1324  BP: 132/81  Pulse: 73  Weight: 180 lb (81.6 kg)  Height: 5\' 7"  (1.702 m)    Body mass index is 28.19 kg/m.   General: The patient is well-developed and well-nourished and in no acute distress  HEENT:  Head is [place]/AT.  Sclera are anicteric.  Funduscopic exam shows normal optic discs and retinal vessels.  Neck: No carotid bruits are noted.  The neck is nontender.  Cardiovascular: The heart has a regular rate and rhythm with a normal S1 and S2. There were no murmurs, gallops or rubs.    Skin: Extremities are without rash or  edema.  Musculoskeletal:  Back is nontender  Neurologic Exam  Mental status: The patient is alert and oriented x 3 at the time of the examination. The patient has apparent normal recent and remote memory, with an apparently normal attention span and concentration ability.   Speech is normal.  Cranial nerves: Extraocular movements are full. Pupils are equal, round, and reactive to light and accomodation.  Symmetric color vision.  Facial symmetry is present. There is good facial sensation to soft touch bilaterally.Facial strength  is normal.  Trapezius and sternocleidomastoid strength is normal. No dysarthria is noted.  The tongue is midline, and the patient has symmetric elevation of the soft palate. No obvious hearing deficits are noted.  Motor:  Muscle bulk is normal.   Tone is increased in the right leg.   Strength is 3/5 right hip flexors, 4+/5 quads, 4/5 hamstrings and 2/5 toe/ankle extension.    Strength is  5 / 5 elsewhere.   Sensory: Sensory testing is intact to pinprick, soft touch and vibration sensation in all 4 extremities.  Coordination: Cerebellar testing reveals good finger-nose-finger and reduced heel to shin worse on right  Gait and station: Station is normal.   She has a wide based gait with a right foot drop.  She cannot tandem. Romberg is negative.   Reflexes: Deep tendon reflexes are symmetric and normal in arms, slightly increased right leg.   Plantar responses are flexor.    DIAGNOSTIC DATA (LABS, IMAGING, TESTING) - I reviewed patient records, labs, notes, testing and imaging myself where available.  Lab Results  Component Value Date   WBC 8.2 07/11/2020   HGB 14.8 07/11/2020   HCT 45.2 07/11/2020   MCV 92.4 07/11/2020   PLT 249 07/11/2020      Component Value Date/Time   NA 141 07/11/2020 1708   K 3.2 (L) 07/11/2020 1708   CL 105 07/11/2020 1708   CO2 26 07/11/2020 1708   GLUCOSE 120 (H) 07/11/2020 1708   BUN 13 07/11/2020 1708   CREATININE 0.92 07/11/2020 1708   CREATININE 0.59 02/09/2018 0930   CALCIUM 9.9 07/11/2020 1708   PROT 7.1 02/09/2018 0930   AST 30 02/09/2018 0930   ALT 39 (H) 02/09/2018 0930   BILITOT 0.5 02/09/2018 0930   GFRNONAA >60 07/11/2020 1708   GFRNONAA 84 12/14/2017 0945   GFRAA 98 12/14/2017 0945    Lab Results  Component Value Date   TSH 1.07 03/09/2017       ASSESSMENT AND PLAN  Multiple sclerosis, relapsing-remitting ([place]) - Plan: HIV Antibody (routine testing w rflx), Hepatitis B surface antigen, Comprehensive metabolic panel, CBC with Differential/Platelet, Hepatitis B surface antibody,qualitative, Hepatitis B core antibody, total, Varicella zoster antibody, IgG, QuantiFERON-TB Gold Plus  High risk medication use - Plan: HIV Antibody (routine testing w rflx), Hepatitis B surface antigen, Comprehensive metabolic panel, CBC with Differential/Platelet, Hepatitis B surface antibody,qualitative, Hepatitis B core  antibody, total, Varicella zoster antibody, IgG, QuantiFERON-TB Gold Plus  Attention deficit disorder (ADD) without hyperactivity  Gait disturbance  Urge incontinence   In summary, Ms. Spillman is a 60 year old woman who was diagnosed with MS in 2011.  According to the MRI report, she likely had some progression around 2014 with a new spinal cord lesion at C1-C2.  Although she has no recent clinical exacerbation, she has had some slow progressive worsening of her gait and right foot drop.  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  We discussed options.  She has had some difficulty tolerating some medications over the last 10 years.  Mavenclad might offer a possibility of good tolerability.  Due to the long-term effect of the 2-year dose combined with the duration of MS/age and no recent exacerbation, she will likely not require additional treatment later in life.  We discussed risks including risk of cancer.  We will check some blood work to make sure that she does not have a chronic infection.   She also has symptoms from her MS including foot  drop, urge incontinence and MS related attention deficit/cognitive difficulties.  Oxybutynin was not tolerated in the past.  I will prescribe Myrbetriq's to help with the urinary incontinence.  Adderall has helped her MS related attention deficit and fatigue a few years back and we will send in a prescription for 10 mg 1 or 2 a day.   She will return to see me in 3 months or sooner if there are new or worsening neurologic symptoms.  Esthefany Herrig A. Felecia Shelling, MD, [facility] 1/49/7026, 3:78 PM Certified in Neurology, Clinical Neurophysiology, Sleep Medicine and Neuroimaging  [facility] [address] [place], [place] [address] [phone]

## 2021-02-18 ENCOUNTER — Other Ambulatory Visit (HOSPITAL_COMMUNITY): Payer: Self-pay

## 2021-02-19 LAB — CBC WITH DIFFERENTIAL/PLATELET
Basophils Absolute: 0.1 10*3/uL (ref 0.0–0.2)
Basos: 1 %
EOS (ABSOLUTE): 0.1 10*3/uL (ref 0.0–0.4)
Eos: 2 %
Hematocrit: 42 % (ref 34.0–46.6)
Hemoglobin: 14 g/dL (ref 11.1–15.9)
Immature Grans (Abs): 0 10*3/uL (ref 0.0–0.1)
Immature Granulocytes: 0 %
Lymphocytes Absolute: 2.1 10*3/uL (ref 0.7–3.1)
Lymphs: 31 %
MCH: 30.7 pg (ref 26.6–33.0)
MCHC: 33.3 g/dL (ref 31.5–35.7)
MCV: 92 fL (ref 79–97)
Monocytes Absolute: 0.4 10*3/uL (ref 0.1–0.9)
Monocytes: 6 %
Neutrophils Absolute: 4 10*3/uL (ref 1.4–7.0)
Neutrophils: 60 %
Platelets: 240 10*3/uL (ref 150–450)
RBC: 4.56 x10E6/uL (ref 3.77–5.28)
RDW: 12.5 % (ref 11.7–15.4)
WBC: 6.7 10*3/uL (ref 3.4–10.8)

## 2021-02-19 LAB — COMPREHENSIVE METABOLIC PANEL
ALT: 38 IU/L — ABNORMAL HIGH (ref 0–32)
AST: 32 IU/L (ref 0–40)
Albumin/Globulin Ratio: 1.5 (ref 1.2–2.2)
Albumin: 4.8 g/dL (ref 3.8–4.9)
Alkaline Phosphatase: 108 IU/L (ref 44–121)
BUN/Creatinine Ratio: 19 (ref 9–23)
BUN: 16 mg/dL (ref 6–24)
Bilirubin Total: 0.7 mg/dL (ref 0.0–1.2)
CO2: 23 mmol/L (ref 20–29)
Calcium: 10.6 mg/dL — ABNORMAL HIGH (ref 8.7–10.2)
Chloride: 102 mmol/L (ref 96–106)
Creatinine, Ser: 0.85 mg/dL (ref 0.57–1.00)
Globulin, Total: 3.2 g/dL (ref 1.5–4.5)
Glucose: 106 mg/dL — ABNORMAL HIGH (ref 65–99)
Potassium: 3.8 mmol/L (ref 3.5–5.2)
Sodium: 142 mmol/L (ref 134–144)
Total Protein: 8 g/dL (ref 6.0–8.5)
eGFR: 79 mL/min/{1.73_m2} (ref >59)

## 2021-02-19 LAB — QUANTIFERON-TB GOLD PLUS
QuantiFERON Mitogen Value: >10 IU/mL
QuantiFERON Nil Value: 0.18 IU/mL
QuantiFERON TB1 Ag Value: 0.15 IU/mL
QuantiFERON TB2 Ag Value: 0.14 IU/mL
QuantiFERON-TB Gold Plus: NEGATIVE

## 2021-02-19 LAB — HEPATITIS B SURFACE ANTIGEN: Hepatitis B Surface Ag: NEGATIVE

## 2021-02-19 LAB — HEPATITIS B SURFACE ANTIBODY,QUALITATIVE: Hep B Surface Ab, Qual: REACTIVE

## 2021-02-19 LAB — VARICELLA ZOSTER ANTIBODY, IGG: Varicella zoster IgG: 1738 index (ref >165)

## 2021-02-19 LAB — HEPATITIS B CORE ANTIBODY, TOTAL: Hep B Core Total Ab: NEGATIVE

## 2021-02-19 LAB — HIV ANTIBODY (ROUTINE TESTING W REFLEX): HIV Screen 4th Generation wRfx: NONREACTIVE

## 2021-02-23 ENCOUNTER — Encounter: Payer: Self-pay | Admitting: Oncology

## 2021-02-23 ENCOUNTER — Other Ambulatory Visit (HOSPITAL_COMMUNITY): Payer: Self-pay

## 2021-02-23 ENCOUNTER — Telehealth: Payer: Self-pay

## 2021-02-23 NOTE — Telephone Encounter (Signed)
-----   Message from Britt Bottom, MD sent at 02/23/2021  2:08 PM EDT ----- Please let the patient know that the lab work is fine and she can start [place].

## 2021-02-23 NOTE — Telephone Encounter (Signed)
I called patient to discuss.  No answer, left a voicemail asking her to call me back.

## 2021-02-24 NOTE — Telephone Encounter (Signed)
I called patient to discuss.  No answer, left a voicemail asking her to call me back.

## 2021-02-25 NOTE — Telephone Encounter (Signed)
Faxed completed/signed Mavenclad start form to [facility] at [phone]. Received fax confirmation. Also sent copy of start form to be scanned by MR to pt chart in epic.

## 2021-02-25 NOTE — Telephone Encounter (Signed)
Pt has called Cyril Mourning, RN back. Please call

## 2021-02-25 NOTE — Telephone Encounter (Signed)
Called pt, relayed message below. She verbalized understanding. Made her aware KD,RN will work on getting her start form submitted. She may reach out to her if there are any questions. Made patient aware she may also get a call from [facility] and to be on the look out for that. She verbalized understanding and appreciation.

## 2021-02-27 NOTE — Telephone Encounter (Signed)
Completed PA for mavenclad. Sent to [employer]. Should have a determination within 3-5 business days. Key: [id number]

## 2021-03-02 NOTE — Telephone Encounter (Signed)
Faxed Mavenclad clearance form back to [employer].  Patient is cleared to start [place].

## 2021-03-02 NOTE — Telephone Encounter (Signed)
PA for [facility] was approved by [employer]. "PA Case: [id number], Status: Approved, Coverage Starts on: 03/01/2021 12:00:00 AM, Coverage Ends on: 09/19/2021 12:00:00 AM."

## 2021-03-03 ENCOUNTER — Telehealth: Payer: Self-pay | Admitting: *Deleted

## 2021-03-03 NOTE — Telephone Encounter (Signed)
Request re faxed to [facility] file room [phone]

## 2021-03-06 ENCOUNTER — Other Ambulatory Visit (HOSPITAL_COMMUNITY): Payer: Self-pay

## 2021-03-06 MED ORDER — DULOXETINE HCL 20 MG PO CPEP
20.0000 mg | ORAL_CAPSULE | Freq: Every day | ORAL | 1 refills | Status: DC
  Filled 2021-03-06: qty 30, 30d supply, fill #0

## 2021-03-06 MED ORDER — HYDROCHLOROTHIAZIDE 12.5 MG PO CAPS
12.5000 mg | ORAL_CAPSULE | Freq: Every day | ORAL | 3 refills | Status: DC
  Filled 2021-03-06: qty 60, 30d supply, fill #0
  Filled 2021-05-06: qty 60, 30d supply, fill #1

## 2021-03-09 NOTE — Telephone Encounter (Signed)
I called patient to discuss if she received her [place] shipment.  No answer, left a voicemail asking her to call me back.

## 2021-03-09 NOTE — Telephone Encounter (Signed)
Patient returned my call.  She has not heard from [facility] to schedule her [place] shipment.  I called [employer].  I spoke with Caryl Pina.  Patient's co-pay was over $5000.  Patient was enrolled in the free drug program.  If patient cannot find funding for her second month of [place] they will also send her a free shipment of [place]. They should be calling patient to schedule her shipment within the next 1 to 2 days.

## 2021-03-15 MED FILL — Pravastatin Sodium Tab 20 MG: ORAL | 30 days supply | Qty: 30 | Fill #2 | Status: AC

## 2021-03-16 ENCOUNTER — Other Ambulatory Visit (HOSPITAL_COMMUNITY): Payer: Self-pay

## 2021-03-16 NOTE — Telephone Encounter (Signed)
I called patient to discuss.  No answer, left a voicemail asking her to call me back.

## 2021-03-17 ENCOUNTER — Other Ambulatory Visit (HOSPITAL_COMMUNITY): Payer: Self-pay

## 2021-03-17 DIAGNOSIS — R5382 Chronic fatigue, unspecified: Secondary | ICD-10-CM | POA: Diagnosis not present

## 2021-03-17 DIAGNOSIS — R202 Paresthesia of skin: Secondary | ICD-10-CM | POA: Diagnosis not present

## 2021-03-17 DIAGNOSIS — G4709 Other insomnia: Secondary | ICD-10-CM | POA: Diagnosis not present

## 2021-03-17 DIAGNOSIS — M542 Cervicalgia: Secondary | ICD-10-CM | POA: Diagnosis not present

## 2021-03-17 DIAGNOSIS — G35 Multiple sclerosis: Secondary | ICD-10-CM | POA: Diagnosis not present

## 2021-03-17 DIAGNOSIS — G43009 Migraine without aura, not intractable, without status migrainosus: Secondary | ICD-10-CM | POA: Diagnosis not present

## 2021-03-17 MED ORDER — GABAPENTIN 600 MG PO TABS
600.0000 mg | ORAL_TABLET | Freq: Every evening | ORAL | 5 refills | Status: DC
  Filled 2021-03-17: qty 30, 30d supply, fill #0
  Filled 2021-05-06: qty 30, 30d supply, fill #1
  Filled 2021-07-16: qty 30, 30d supply, fill #2
  Filled 2021-09-03: qty 30, 30d supply, fill #3

## 2021-03-18 ENCOUNTER — Telehealth: Payer: Self-pay | Admitting: *Deleted

## 2021-03-18 ENCOUNTER — Other Ambulatory Visit (HOSPITAL_COMMUNITY): Payer: Self-pay

## 2021-03-18 MED FILL — Galcanezumab-gnlm Subcutaneous Soln Auto-Injector 120 MG/ML: SUBCUTANEOUS | 28 days supply | Qty: 1 | Fill #2 | Status: AC

## 2021-03-18 NOTE — Telephone Encounter (Signed)
Took call from [facility]. They see where Dr. Felecia Shelling d/c'd emgality. Pt needing refill, wanted to see reason why it was d/c'd. I reviewed chart w/ MD. Documented pt doing well on therapy. Must have been d/c'd in error. Ok to continue. She verbalized understanding.

## 2021-03-19 ENCOUNTER — Other Ambulatory Visit (HOSPITAL_COMMUNITY): Payer: Self-pay

## 2021-03-19 MED ORDER — EMGALITY 120 MG/ML ~~LOC~~ SOAJ
120.0000 mg | SUBCUTANEOUS | 6 refills | Status: DC
  Filled 2021-03-19 – 2021-04-17 (×2): qty 1, 28d supply, fill #0
  Filled 2021-05-17: qty 1, 28d supply, fill #1
  Filled 2021-06-16: qty 1, 28d supply, fill #2
  Filled 2021-07-16: qty 1, 28d supply, fill #3
  Filled 2021-08-16: qty 1, 28d supply, fill #4
  Filled 2021-09-15: qty 1, 28d supply, fill #5
  Filled 2021-10-13: qty 1, 28d supply, fill #6

## 2021-03-19 MED ORDER — BUPROPION HCL 75 MG PO TABS
ORAL_TABLET | ORAL | 11 refills | Status: DC
  Filled 2021-03-19: qty 30, 30d supply, fill #0
  Filled 2021-05-21: qty 30, 30d supply, fill #1
  Filled 2021-06-16: qty 30, 30d supply, fill #2
  Filled 2021-07-24: qty 30, 30d supply, fill #3
  Filled 2021-08-17: qty 30, 30d supply, fill #4
  Filled 2021-09-15: qty 30, 30d supply, fill #5
  Filled 2021-10-13: qty 30, 30d supply, fill #6
  Filled 2021-11-12: qty 30, 30d supply, fill #7
  Filled 2021-12-18: qty 30, 30d supply, fill #8
  Filled 2022-01-21: qty 30, 30d supply, fill #9
  Filled 2022-02-18: qty 30, 30d supply, fill #10
  Filled 2022-03-19 (×2): qty 30, 30d supply, fill #11

## 2021-03-24 DIAGNOSIS — R0981 Nasal congestion: Secondary | ICD-10-CM | POA: Diagnosis not present

## 2021-03-24 DIAGNOSIS — H6691 Otitis media, unspecified, right ear: Secondary | ICD-10-CM | POA: Diagnosis not present

## 2021-03-24 DIAGNOSIS — J029 Acute pharyngitis, unspecified: Secondary | ICD-10-CM | POA: Diagnosis not present

## 2021-03-24 DIAGNOSIS — Z6827 Body mass index (BMI) 27.0-27.9, adult: Secondary | ICD-10-CM | POA: Diagnosis not present

## 2021-03-24 NOTE — Telephone Encounter (Signed)
I called patient again to discuss.  No answer, left a voicemail asking her to call me back.

## 2021-03-31 NOTE — Telephone Encounter (Signed)
I called patient.  She reports that she started [facility] June 24.  She tolerated it well.  Unfortunately, over the past week she started to feel badly and tested positive for COVID.  She wants to make sure she is able to take her second month of [place].  She also has not heard from [facility] delivery of her second month of [place].  I spoke with Dr. Felecia Shelling.  Patient is fine to take her second month of [place] as scheduled even though she was positive for COVID this week.  I called [employer].  I spoke with Danielle.  She will call the patient to schedule the second month of Mavenclad to be delivered.  I called patient.  I discussed this with her.  She will let us know if she has further questions or concerns.  I reminded her of her follow-up as scheduled in September with Dr. Felecia Shelling.  I advised her that he will check blood work at that appointment.  Patient verbalized understanding.

## 2021-03-31 NOTE — Telephone Encounter (Signed)
I called patient again.  She denies needing me at this time.

## 2021-03-31 NOTE — Telephone Encounter (Signed)
I called patient again to discuss.  No answer, left a voicemail asking her to call me back.  I will send her a MyChart message.

## 2021-03-31 NOTE — Telephone Encounter (Signed)
Pt returned [employer] phone call. Please call back.

## 2021-04-08 DIAGNOSIS — I119 Hypertensive heart disease without heart failure: Secondary | ICD-10-CM | POA: Diagnosis not present

## 2021-04-08 DIAGNOSIS — G4719 Other hypersomnia: Secondary | ICD-10-CM | POA: Diagnosis not present

## 2021-04-08 DIAGNOSIS — E782 Mixed hyperlipidemia: Secondary | ICD-10-CM | POA: Diagnosis not present

## 2021-04-08 DIAGNOSIS — G4700 Insomnia, unspecified: Secondary | ICD-10-CM | POA: Diagnosis not present

## 2021-04-08 DIAGNOSIS — K219 Gastro-esophageal reflux disease without esophagitis: Secondary | ICD-10-CM | POA: Diagnosis not present

## 2021-04-08 DIAGNOSIS — Z6827 Body mass index (BMI) 27.0-27.9, adult: Secondary | ICD-10-CM | POA: Diagnosis not present

## 2021-04-08 DIAGNOSIS — I700 Atherosclerosis of aorta: Secondary | ICD-10-CM | POA: Diagnosis not present

## 2021-04-08 DIAGNOSIS — G43009 Migraine without aura, not intractable, without status migrainosus: Secondary | ICD-10-CM | POA: Diagnosis not present

## 2021-04-08 DIAGNOSIS — N319 Neuromuscular dysfunction of bladder, unspecified: Secondary | ICD-10-CM | POA: Diagnosis not present

## 2021-04-08 DIAGNOSIS — Z79899 Other long term (current) drug therapy: Secondary | ICD-10-CM | POA: Diagnosis not present

## 2021-04-08 DIAGNOSIS — Z0000 Encounter for general adult medical examination without abnormal findings: Secondary | ICD-10-CM | POA: Diagnosis not present

## 2021-04-08 DIAGNOSIS — G35 Multiple sclerosis: Secondary | ICD-10-CM | POA: Diagnosis not present

## 2021-04-10 ENCOUNTER — Telehealth: Payer: Self-pay | Admitting: Neurology

## 2021-04-10 DIAGNOSIS — D1801 Hemangioma of skin and subcutaneous tissue: Secondary | ICD-10-CM | POA: Diagnosis not present

## 2021-04-10 DIAGNOSIS — N3091 Cystitis, unspecified with hematuria: Secondary | ICD-10-CM | POA: Diagnosis not present

## 2021-04-10 DIAGNOSIS — L578 Other skin changes due to chronic exposure to nonionizing radiation: Secondary | ICD-10-CM | POA: Diagnosis not present

## 2021-04-10 NOTE — Telephone Encounter (Signed)
Patient called to see if she can take cipro for a UTI while on Mavenclad. I don't see any interaction when checking on a medication interaction or performing a literature search. I called multiple times, received voice mail each time, left this message.

## 2021-04-10 NOTE — Telephone Encounter (Signed)
Pt called wanting to know if she should begin taking her Cladribine, 8 Tabs, (MAVENCLAD, 8 TABS,) 10 MG TBPK, if she possibly has a UTI. She stated the [facility] nurse told her to check with provider here. Pt requesting a call back.

## 2021-04-13 NOTE — Telephone Encounter (Signed)
Called and spoke w/ pt. Relayed per Dr. Felecia Shelling that it is ok for her to take cipro while on [place]. She verbalized understanding.

## 2021-04-14 MED FILL — Pravastatin Sodium Tab 20 MG: ORAL | 30 days supply | Qty: 30 | Fill #3 | Status: AC

## 2021-04-14 MED FILL — Galcanezumab-gnlm Subcutaneous Soln Auto-Injector 120 MG/ML: SUBCUTANEOUS | 28 days supply | Qty: 1 | Fill #3 | Status: AC

## 2021-04-15 ENCOUNTER — Other Ambulatory Visit (HOSPITAL_COMMUNITY): Payer: Self-pay

## 2021-04-17 ENCOUNTER — Other Ambulatory Visit (HOSPITAL_COMMUNITY): Payer: Self-pay

## 2021-04-17 ENCOUNTER — Encounter: Payer: Self-pay | Admitting: Oncology

## 2021-04-19 ENCOUNTER — Telehealth: Payer: Self-pay | Admitting: Neurology

## 2021-04-19 NOTE — Telephone Encounter (Signed)
She had Mavenclad late June 2022 and 04/12/2021-04/16/2021  She will be due for year 19 February 2022

## 2021-04-21 NOTE — Telephone Encounter (Signed)
Denise RN PCP's nurse requesting a call back wanting to know can they add Asprin to pt's daily med intake. Please give her a call back at [phone] ext [phone]. Please advise.

## 2021-04-21 NOTE — Telephone Encounter (Signed)
Called, LVM letting Langley Gauss know ok to add ASA per Dr. Felecia Shelling. Advised her to call back if any questions.

## 2021-05-06 ENCOUNTER — Other Ambulatory Visit (HOSPITAL_COMMUNITY): Payer: Self-pay

## 2021-05-17 ENCOUNTER — Other Ambulatory Visit (HOSPITAL_COMMUNITY): Payer: Self-pay

## 2021-05-17 MED FILL — Pravastatin Sodium Tab 20 MG: ORAL | 30 days supply | Qty: 30 | Fill #4 | Status: AC

## 2021-05-18 ENCOUNTER — Other Ambulatory Visit (HOSPITAL_COMMUNITY): Payer: Self-pay

## 2021-05-18 MED ORDER — ZOLPIDEM TARTRATE ER 6.25 MG PO TBCR
6.2500 mg | EXTENDED_RELEASE_TABLET | Freq: Every day | ORAL | 3 refills | Status: DC
  Filled 2021-05-18: qty 30, 30d supply, fill #0

## 2021-05-21 ENCOUNTER — Other Ambulatory Visit (HOSPITAL_COMMUNITY): Payer: Self-pay

## 2021-05-21 DIAGNOSIS — M65342 Trigger finger, left ring finger: Secondary | ICD-10-CM | POA: Diagnosis not present

## 2021-05-21 DIAGNOSIS — M25511 Pain in right shoulder: Secondary | ICD-10-CM | POA: Diagnosis not present

## 2021-05-21 DIAGNOSIS — M79642 Pain in left hand: Secondary | ICD-10-CM | POA: Diagnosis not present

## 2021-06-04 ENCOUNTER — Other Ambulatory Visit: Payer: Self-pay

## 2021-06-04 ENCOUNTER — Encounter: Payer: Self-pay | Admitting: Neurology

## 2021-06-04 ENCOUNTER — Ambulatory Visit: Payer: PPO | Admitting: Neurology

## 2021-06-04 VITALS — BP 122/76 | HR 64 | Ht 67.0 in | Wt 177.5 lb

## 2021-06-04 DIAGNOSIS — R269 Unspecified abnormalities of gait and mobility: Secondary | ICD-10-CM | POA: Diagnosis not present

## 2021-06-04 DIAGNOSIS — F988 Other specified behavioral and emotional disorders with onset usually occurring in childhood and adolescence: Secondary | ICD-10-CM

## 2021-06-04 DIAGNOSIS — Z79899 Other long term (current) drug therapy: Secondary | ICD-10-CM | POA: Diagnosis not present

## 2021-06-04 DIAGNOSIS — G35 Multiple sclerosis: Secondary | ICD-10-CM

## 2021-06-04 DIAGNOSIS — N3941 Urge incontinence: Secondary | ICD-10-CM

## 2021-06-04 NOTE — Progress Notes (Signed)
[facility]  PATIENT: Cynthia Nichols DOB: 1961-09-01  REFERRING DOCTOR OR PCP: Cynthia Priestly, MD ([place]); Cynthia Braun, FNP (PCP) SOURCE: Patient, notes from neurology, imaging and lab reports.  _________________________________   HISTORICAL  CHIEF COMPLAINT:  Chief Complaint  Patient presents with   Follow-up    Rm 2, alone. Here for 4 month MS f/u, on Mavenclad. Pt continues to have issues walking. Pt ambulates with a cane.     HISTORY OF PRESENT ILLNESS:  Cynthia Nichols is a 60 y.o. woman with multiple sclerosis.  She had no other exacerbations.     Update 06/04/2021: At the last visit, Mavenclad was initiated.   She had the two first weeks late June 2022 and July 24 - 04/16/21.    She felt very tired and achy the second week but was fine a few days later.      Her two biggest issues are reduced gait and fatigue.   She also has more pain.    She was started on gabapentin but stopped due to   Gait is poor due to  right leg weakness.   She has an AFO for the right leg and uses a cane or a walker.   She goes short distances without support.  She denies much spasticity in her right leg.   She has pain in her back, hips and legs, probably worse on the right. She has numbness and tingling in her feet.  She has urge incontinence / nocturia.   She would prefer not to take another pill (we had discussed Myrbetriq).   Oxybutynin was poorly tolerated.    She has fatigue.  In the past she was on Provigil without benefit.   Adderall (10 mg)  helped her but she has not been on x 2-3 years.  She sleeps poorly.  Ambien CR 6.25 mg helps her fall asleep but she has some sleep maintenance issues.    She has anxiety > depression.   She notes has cognitive issue.  Word finding is difficult at times.   She has trouble with parallel processing.     She has LBP, worse with prolonged standing.    MRI in 2018 was normal by report.   She broke her right humerus in a fall in 2018.      She has migraine headaches well controlled on zonisamide and Emgality,   She has non-migraine headaches more than migraine  MS History:  She had the onset of right leg weakness one day in 2011.  Symptoms did not improve.  However, she began to have bladder issues a few years earlier.    She initially was seen at [facility].  MRI of the cervical spine 11/12/2009 showed only the one spot at [facility].   She had EP studies (normal) and CSF analysis which was reporgtedly c/w MS.   She was placed on Copaxone.  She had episodes of diplopia which were fleeting.   She was switched to Tecfidera bit had trouble tolerating it.   After several years, she switched to Tysabri.   She felt great after the first dose but felt poorly after the second dose so she stopped.   About 2 years ago, she started Mayzent but stopped after 3 months due to feeling poorly.   Though no relapses after she stopped, gait has slowly worsened.  Mavenclad was initiated in June 022.      Imaging reports (actual images have been requested but were not available  at the time of the visit): MRI of the brain report 06/14/2019 "No significant change nonenhancing white matter foci compatible with multiple sclerosis. No new lesion"  MRI of the cervical spine 06/14/2019 report "T2 signal: Dorsal lateral right at C1 stable. Right ventrolateral. C4 Which may or may not have been present previously. Bilateral ventral cord left greater than right as seen previously adjacent to extrusion as per below, question right progression versus artifact... Right lateral C6 stable.. Large longitudinal focus left  T2 stable on sagittal images.    Degenerative change with a large disc osteophyte complex at C5-C6 to the right with moderate bilateral foraminal narrowing and moderate spinal stenosis.  milder problems at other levels.   REVIEW OF SYSTEMS: Constitutional: No fevers, chills, sweats, or change in appetite Eyes: No visual changes, double vision, eye pain Ear, nose  and throat: No hearing loss, ear pain, nasal congestion, sore throat Cardiovascular: No chest pain, palpitations Respiratory:  No shortness of breath at rest or with exertion.   No wheezes GastrointestinaI: No nausea, vomiting, diarrhea, abdominal pain, fecal incontinence Genitourinary:  No dysuria, urinary retention or frequency.  No nocturia. Musculoskeletal:  No neck pain, back pain Integumentary: No rash, pruritus, skin lesions Neurological: as above Psychiatric: No depression at this time.  No anxiety Endocrine: No palpitations, diaphoresis, change in appetite, change in weigh or increased thirst Hematologic/Lymphatic:  No anemia, purpura, petechiae. Allergic/Immunologic: No itchy/runny eyes, nasal congestion, recent allergic reactions, rashes  ALLERGIES: Allergies  Allergen Reactions   Ampyra [Dalfampridine] Anaphylaxis and Shortness Of Breath    Chest pain, also    Imitrex [Sumatriptan] Other (See Comments)    Chest tightness   Crestor [Rosuvastatin Calcium] Other (See Comments)    Abdominal pain, shortness of breath   Peanut-Containing Drug Products Itching    Itching in the mouth (remarked that she still eats this in limited amounts)   Shrimp [Shellfish Allergy] Itching    Itching in the mouth (remarked that she still eats this in limited amounts)    HOME MEDICATIONS:  Current Outpatient Medications:    albuterol (PROVENTIL) (2.5 MG/3ML) 0.083% nebulizer solution, USE 1 VIAL VIA NEBULIZER EVERY 4 TO 6 HOURS AS NEEDED FOR SHORTNESS OF BREATH /WHEEZING, Disp: 225 mL, Rfl: 0   amphetamine-dextroamphetamine (ADDERALL) 10 MG tablet, Take 1 tablet (10 mg total) by mouth 2 (two) times daily., Disp: 60 tablet, Rfl: 0   bisoprolol (ZEBETA) 5 MG tablet, TAKE 1/2-1 TABLET BY MOUTH DAILY FOR ELEVATED BLOOD PRESSURE, Disp: 30 tablet, Rfl: 2   buPROPion (WELLBUTRIN) 75 MG tablet, Take 1 tablet (75 mg total) by mouth once daily, Disp: 30 tablet, Rfl: 11   candesartan (ATACAND) 8 MG  tablet, Take 1 tablet (8 mg total) by mouth daily. (Patient taking differently: Take 8 mg by mouth daily as needed.), Disp: 90 tablet, Rfl: 1   Cholecalciferol (VITAMIN D) 50 MCG (2000 UT) CAPS, Take 5,000 Units by mouth daily., Disp: , Rfl:    Cladribine, 8 Tabs, (MAVENCLAD, 8 TABS,) 10 MG TBPK, Take by mouth. Month one: Day 1-3: 2 tabs/day, Day 4 and 5: 1 tab/day Month two: Day 1 and 2: 2 tabs/day, Days 3-5: 1 tab/day, Disp: , Rfl:    ergocalciferol (VITAMIN D2) 1.25 MG (50000 UT) capsule, Take 1 capsule (50,000 Units total) by mouth once a week., Disp: 24 capsule, Rfl: 0   gabapentin (NEURONTIN) 600 MG tablet, Take 1 tablet (600 mg total) by mouth every evening. (Patient taking differently: Take 300 mg by mouth every  evening.), Disp: 30 tablet, Rfl: 5   Galcanezumab-gnlm 120 MG/ML SOAJ, INJECT 120 MG INTO THE SKIN EVERY 28 DAYS., Disp: 1 mL, Rfl: 6   hydrochlorothiazide (HYDRODIURIL) 12.5 MG tablet, Take by mouth., Disp: , Rfl:    pravastatin (PRAVACHOL) 20 MG tablet, TAKE 1 TABLET BY MOUTH DAILY, Disp: 30 tablet, Rfl: 6   zonisamide (ZONEGRAN) 100 MG capsule, Take 200 mg by mouth at bedtime., Disp: , Rfl:    zolpidem (AMBIEN CR) 6.25 MG CR tablet, TAKE 1 TABLET BY MOUTH AT BEDTIME, Disp: 30 tablet, Rfl: 3  PAST MEDICAL HISTORY: Past Medical History:  Diagnosis Date   Adopted    Patient has no known family history   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   Aortic atherosclerosis ([place]) 11/29/2017   Asthma    Asthma 11/02/2019   Closed fracture of shaft of right humerus 05/13/2017   Closed low lateral malleolus fracture, left, initial encounter 10/17/2018   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   Essential hypertension, benign 11/29/2017   Fracture of fifth metatarsal bone 08/13/2015   Greater trochanteric bursitis, left 12/31/2019   Injection given December 31, 2019   Headache(784.0)    Humerus fracture    right with radial nerve palsy   Hypertension    IBS (irritable bowel syndrome)     Migraine without aura and without status migrainosus, not intractable 02/09/2018   MS (multiple sclerosis) ([place])    Multiple sclerosis, relapsing-remitting ([place]) 12/14/2011   Osteoporosis 11/29/2017   Other fracture of shaft of right humerus, initial encounter for closed fracture 05/13/2017   Patella fracture 07/03/2015   Hairline oblique     Patellar subluxation, left, initial encounter 08/14/2018   PONV (postoperative nausea and vomiting)    Radial nerve palsy, 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   Right-sided    Unspecified venous (peripheral) insufficiency 11/09/2011   Venous aneurysm    Right side of neck    PAST SURGICAL HISTORY: Past Surgical History:  Procedure Laterality Date   ABDOMINAL HYSTERECTOMY     BREAST MASS EXCISION     Bilateral breast mass excision ( Benign)   ORIF HUMERUS FRACTURE Right 05/17/2017   TENNIS ELBOW RELEASE/NIRSCHEL PROCEDURE Right 05/17/2017   Procedure: EXPLORATION AND EXPOSURE OF RADIAL NERVE RIGHT ARM;  Surgeon: Roseanne Kaufman, MD;  Location: [facility];  Service: Orthopedics;  Laterality: Right;    FAMILY HISTORY: Family History  Adopted: Yes    SOCIAL HISTORY:  Social History   Socioeconomic History   Marital status: Married    Spouse name: Naziya Escher   Number of children: 3   Years of education: Not on file   Highest education level: Associate degree: occupational, Hotel manager, or vocational program  Occupational History   Occupation: disabled    Comment: Therapist, sports  Tobacco Use   Smoking status: Never   Smokeless tobacco: Never  Vaping Use   Vaping Use: Never used  Substance and Sexual Activity   Alcohol use: No    Alcohol/week: 0.0 standard drinks   Drug use: No   Sexual activity: Yes    Partners: Male  Other Topics Concern   Not on file  Social History Narrative   Not on file   Social Determinants of Health   Financial Resource Strain: Not on file  Food Insecurity: Not on file  Transportation Needs:  Not on file  Physical Activity: Not on file  Stress: Not on file  Social Connections: Not on file  Intimate Partner  Violence: Not on file     PHYSICAL EXAM  Vitals:   06/04/21 1424  BP: 122/76  Pulse: 64  Weight: 177 lb 8 oz (80.5 kg)  Height: '5\' 7"'$  (1.702 m)    Body mass index is 27.8 kg/m.   General: The patient is well-developed and well-nourished and in no acute distress  HEENT:  Head is [place]/AT.  Sclera are anicteric.  Funduscopic exam shows normal optic discs and retinal vessels.  Neck: No carotid bruits are noted.  The neck is nontender.  Cardiovascular: The heart has a regular rate and rhythm with a normal S1 and S2. There were no murmurs, gallops or rubs.    Skin: Extremities are without rash or  edema.  Musculoskeletal:  Back is nontender  Neurologic Exam  Mental status: The patient is alert and oriented x 3 at the time of the examination. The patient has apparent normal recent and remote memory, with an apparently normal attention span and concentration ability.   Speech is normal.  Cranial nerves: Extraocular movements are full.    There is good facial sensation to soft touch bilaterally.Facial strength is normal.  Trapezius and sternocleidomastoid strength is normal. No dysarthria is noted.   e. No obvious hearing deficits are noted.  Motor:  Muscle bulk is normal.   Tone is increased in the right leg.   Strength is 4-/5 right hip flexors, 4+/5 quads, 4/5 hamstrings and 2+/5 toe/ankle extension.   Strength is  5 / 5 elsewhere.   Sensory: Sensory testing is intact to pinprick, soft touch and vibration sensation in all 4 extremities.  Coordination: Cerebellar testing reveals good finger-nose-finger and reduced heel to shin worse on right  Gait and station: Station is normal.   She has a wide based gait with a right foot drop.  Unable to tandem walk. Romberg is negative.   Reflexes: Deep tendon reflexes are symmetric and normal in arms, slightly increased right  leg.        DIAGNOSTIC DATA (LABS, IMAGING, TESTING) - I reviewed patient records, labs, notes, testing and imaging myself where available.  Lab Results  Component Value Date   WBC 6.7 02/17/2021   HGB 14.0 02/17/2021   HCT 42.0 02/17/2021   MCV 92 02/17/2021   PLT 240 02/17/2021      Component Value Date/Time   NA 142 02/17/2021 1433   K 3.8 02/17/2021 1433   CL 102 02/17/2021 1433   CO2 23 02/17/2021 1433   GLUCOSE 106 (H) 02/17/2021 1433   GLUCOSE 120 (H) 07/11/2020 1708   BUN 16 02/17/2021 1433   CREATININE 0.85 02/17/2021 1433   CREATININE 0.59 02/09/2018 0930   CALCIUM 10.6 (H) 02/17/2021 1433   PROT 8.0 02/17/2021 1433   ALBUMIN 4.8 02/17/2021 1433   AST 32 02/17/2021 1433   ALT 38 (H) 02/17/2021 1433   ALKPHOS 108 02/17/2021 1433   BILITOT 0.7 02/17/2021 1433   GFRNONAA >60 07/11/2020 1708   GFRNONAA 84 12/14/2017 0945   GFRAA 98 12/14/2017 0945    Lab Results  Component Value Date   TSH 1.07 03/09/2017       ASSESSMENT AND PLAN  Multiple sclerosis, relapsing-remitting (HCC) - Plan: CBC with Differential/Platelet, Hepatic function panel  High risk medication use - Plan: CBC with Differential/Platelet, Hepatic function panel  Attention deficit disorder (ADD) without hyperactivity  Urge incontinence  Gait disturbance     She tolerated the forst year of Mavenclad well --- check 2 month labs and consider Valtrex  if lymphocytes 0.3 or lower. We discussedactive SPMS and expectations of medicaitons (prevent new lesions).  Unfortunately medications are not too effective against the slow progression. Stay active and exercise as tolerated. Rtc 4 months or sooner if new or worsening issues    Yunus Stoklosa A. Felecia Shelling, MD, [facility] [id number], [id number] PM Certified in Neurology, Clinical Neurophysiology, Sleep Medicine and Neuroimaging  [facility] [address] [place], [place] [address] [phone]

## 2021-06-05 LAB — HEPATIC FUNCTION PANEL
ALT: 34 IU/L — ABNORMAL HIGH (ref 0–32)
AST: 23 IU/L (ref 0–40)
Albumin: 4.6 g/dL (ref 3.8–4.9)
Alkaline Phosphatase: 104 IU/L (ref 44–121)
Bilirubin Total: 1.2 mg/dL (ref 0.0–1.2)
Bilirubin, Direct: 0.27 mg/dL (ref 0.00–0.40)
Total Protein: 7.4 g/dL (ref 6.0–8.5)

## 2021-06-05 LAB — CBC WITH DIFFERENTIAL/PLATELET
Basophils Absolute: 0.1 10*3/uL (ref 0.0–0.2)
Basos: 1 %
EOS (ABSOLUTE): 0.2 10*3/uL (ref 0.0–0.4)
Eos: 3 %
Hematocrit: 38.9 % (ref 34.0–46.6)
Hemoglobin: 13.2 g/dL (ref 11.1–15.9)
Immature Grans (Abs): 0 10*3/uL (ref 0.0–0.1)
Immature Granulocytes: 0 %
Lymphocytes Absolute: 0.6 10*3/uL — ABNORMAL LOW (ref 0.7–3.1)
Lymphs: 14 %
MCH: 31.8 pg (ref 26.6–33.0)
MCHC: 33.9 g/dL (ref 31.5–35.7)
MCV: 94 fL (ref 79–97)
Monocytes Absolute: 0.4 10*3/uL (ref 0.1–0.9)
Monocytes: 8 %
Neutrophils Absolute: 3.2 10*3/uL (ref 1.4–7.0)
Neutrophils: 74 %
Platelets: 207 10*3/uL (ref 150–450)
RBC: 4.15 x10E6/uL (ref 3.77–5.28)
RDW: 12.9 % (ref 11.7–15.4)
WBC: 4.4 10*3/uL (ref 3.4–10.8)

## 2021-06-16 ENCOUNTER — Other Ambulatory Visit (HOSPITAL_COMMUNITY): Payer: Self-pay

## 2021-06-16 MED ORDER — ZOLPIDEM TARTRATE ER 6.25 MG PO TBCR
6.2500 mg | EXTENDED_RELEASE_TABLET | Freq: Every day | ORAL | 3 refills | Status: DC
  Filled 2021-06-16: qty 30, 30d supply, fill #0
  Filled 2021-07-16: qty 30, 30d supply, fill #1
  Filled 2021-08-16: qty 30, 30d supply, fill #2
  Filled 2021-09-15: qty 30, 30d supply, fill #3

## 2021-06-16 MED ORDER — EMGALITY 120 MG/ML ~~LOC~~ SOAJ
120.0000 mg | SUBCUTANEOUS | 3 refills | Status: DC
  Filled 2021-06-16 (×2): qty 3, 84d supply, fill #0

## 2021-06-16 MED FILL — Pravastatin Sodium Tab 20 MG: ORAL | 30 days supply | Qty: 30 | Fill #5 | Status: AC

## 2021-06-17 ENCOUNTER — Other Ambulatory Visit (HOSPITAL_COMMUNITY): Payer: Self-pay

## 2021-06-17 MED ORDER — BISOPROLOL FUMARATE 5 MG PO TABS
2.5000 mg | ORAL_TABLET | Freq: Every day | ORAL | 2 refills | Status: DC
  Filled 2021-06-17: qty 30, 30d supply, fill #0
  Filled 2021-08-17: qty 30, 30d supply, fill #1
  Filled 2021-10-13: qty 30, 30d supply, fill #2

## 2021-06-29 ENCOUNTER — Other Ambulatory Visit (HOSPITAL_COMMUNITY): Payer: Self-pay

## 2021-06-29 DIAGNOSIS — R197 Diarrhea, unspecified: Secondary | ICD-10-CM | POA: Diagnosis not present

## 2021-06-29 DIAGNOSIS — R1011 Right upper quadrant pain: Secondary | ICD-10-CM | POA: Diagnosis not present

## 2021-06-29 DIAGNOSIS — Z6827 Body mass index (BMI) 27.0-27.9, adult: Secondary | ICD-10-CM | POA: Diagnosis not present

## 2021-06-29 MED ORDER — OMEPRAZOLE 20 MG PO CPDR
20.0000 mg | DELAYED_RELEASE_CAPSULE | Freq: Every day | ORAL | 1 refills | Status: DC
  Filled 2021-06-29: qty 30, 30d supply, fill #0

## 2021-06-29 MED ORDER — DICYCLOMINE HCL 20 MG PO TABS
20.0000 mg | ORAL_TABLET | Freq: Three times a day (TID) | ORAL | 1 refills | Status: DC | PRN
  Filled 2021-06-29: qty 30, 10d supply, fill #0

## 2021-06-30 ENCOUNTER — Other Ambulatory Visit (HOSPITAL_COMMUNITY): Payer: Self-pay

## 2021-06-30 MED ORDER — POTASSIUM CHLORIDE CRYS ER 20 MEQ PO TBCR
20.0000 meq | EXTENDED_RELEASE_TABLET | Freq: Two times a day (BID) | ORAL | 0 refills | Status: DC
  Filled 2021-06-30: qty 14, 7d supply, fill #0

## 2021-07-02 ENCOUNTER — Other Ambulatory Visit (HOSPITAL_COMMUNITY): Payer: Self-pay

## 2021-07-02 MED ORDER — HYDROCHLOROTHIAZIDE 12.5 MG PO CAPS
12.5000 mg | ORAL_CAPSULE | Freq: Every day | ORAL | 3 refills | Status: DC
  Filled 2021-07-02: qty 60, 30d supply, fill #0
  Filled 2021-09-03: qty 60, 30d supply, fill #1
  Filled 2021-10-30: qty 60, 30d supply, fill #2
  Filled 2022-01-11: qty 60, 30d supply, fill #3

## 2021-07-16 ENCOUNTER — Other Ambulatory Visit (HOSPITAL_COMMUNITY): Payer: Self-pay

## 2021-07-17 ENCOUNTER — Other Ambulatory Visit (HOSPITAL_COMMUNITY): Payer: Self-pay

## 2021-07-17 MED ORDER — PRAVASTATIN SODIUM 20 MG PO TABS
20.0000 mg | ORAL_TABLET | Freq: Every day | ORAL | 1 refills | Status: DC
  Filled 2021-07-17: qty 90, 90d supply, fill #0
  Filled 2021-10-13: qty 90, 90d supply, fill #1

## 2021-07-21 ENCOUNTER — Encounter: Payer: Self-pay | Admitting: Oncology

## 2021-07-21 ENCOUNTER — Other Ambulatory Visit (HOSPITAL_COMMUNITY): Payer: Self-pay

## 2021-07-21 DIAGNOSIS — U071 COVID-19: Secondary | ICD-10-CM

## 2021-07-21 DIAGNOSIS — E876 Hypokalemia: Secondary | ICD-10-CM | POA: Diagnosis not present

## 2021-07-21 DIAGNOSIS — M25551 Pain in right hip: Secondary | ICD-10-CM | POA: Diagnosis not present

## 2021-07-21 HISTORY — DX: COVID-19: U07.1

## 2021-07-21 MED ORDER — KETOROLAC TROMETHAMINE 10 MG PO TABS
10.0000 mg | ORAL_TABLET | Freq: Four times a day (QID) | ORAL | 0 refills | Status: DC
  Filled 2021-07-21: qty 20, 5d supply, fill #0

## 2021-07-22 ENCOUNTER — Other Ambulatory Visit (HOSPITAL_COMMUNITY): Payer: Self-pay

## 2021-07-22 MED ORDER — POTASSIUM CHLORIDE CRYS ER 20 MEQ PO TBCR
EXTENDED_RELEASE_TABLET | ORAL | 0 refills | Status: DC
  Filled 2021-07-22: qty 37, 30d supply, fill #0

## 2021-07-24 ENCOUNTER — Other Ambulatory Visit (HOSPITAL_COMMUNITY): Payer: Self-pay

## 2021-07-27 ENCOUNTER — Other Ambulatory Visit (HOSPITAL_COMMUNITY): Payer: Self-pay

## 2021-08-03 ENCOUNTER — Other Ambulatory Visit (HOSPITAL_COMMUNITY): Payer: Self-pay

## 2021-08-03 MED FILL — Zonisamide Cap 100 MG: ORAL | 90 days supply | Qty: 180 | Fill #1 | Status: AC

## 2021-08-17 ENCOUNTER — Other Ambulatory Visit (HOSPITAL_COMMUNITY): Payer: Self-pay

## 2021-09-03 ENCOUNTER — Other Ambulatory Visit (HOSPITAL_COMMUNITY): Payer: Self-pay

## 2021-09-15 ENCOUNTER — Other Ambulatory Visit (HOSPITAL_COMMUNITY): Payer: Self-pay

## 2021-09-30 ENCOUNTER — Encounter: Payer: Self-pay | Admitting: Oncology

## 2021-10-07 ENCOUNTER — Encounter: Payer: Self-pay | Admitting: Neurology

## 2021-10-07 ENCOUNTER — Ambulatory Visit: Payer: PPO | Admitting: Neurology

## 2021-10-07 ENCOUNTER — Other Ambulatory Visit (HOSPITAL_COMMUNITY): Payer: Self-pay

## 2021-10-07 ENCOUNTER — Encounter: Payer: Self-pay | Admitting: Oncology

## 2021-10-07 ENCOUNTER — Telehealth: Payer: Self-pay | Admitting: Neurology

## 2021-10-07 VITALS — BP 130/81 | HR 67 | Ht 67.0 in | Wt 173.2 lb

## 2021-10-07 DIAGNOSIS — R269 Unspecified abnormalities of gait and mobility: Secondary | ICD-10-CM | POA: Diagnosis not present

## 2021-10-07 DIAGNOSIS — G35 Multiple sclerosis: Secondary | ICD-10-CM | POA: Diagnosis not present

## 2021-10-07 DIAGNOSIS — R3915 Urgency of urination: Secondary | ICD-10-CM | POA: Diagnosis not present

## 2021-10-07 DIAGNOSIS — Z79899 Other long term (current) drug therapy: Secondary | ICD-10-CM | POA: Diagnosis not present

## 2021-10-07 MED ORDER — AMPHETAMINE-DEXTROAMPHETAMINE 10 MG PO TABS
10.0000 mg | ORAL_TABLET | Freq: Two times a day (BID) | ORAL | 0 refills | Status: DC
  Filled 2021-10-07: qty 60, 30d supply, fill #0

## 2021-10-07 NOTE — Telephone Encounter (Signed)
sedated at [facility] no auth req sent to [facility] to be scheduled

## 2021-10-07 NOTE — Progress Notes (Signed)
[facility]  PATIENT: Cynthia Nichols DOB: 03-Oct-1960  REFERRING DOCTOR OR PCP: Darlin Priestly, MD ([place]); Clydie Braun, FNP (PCP) SOURCE: Patient, notes from neurology, imaging and lab reports.  _________________________________   HISTORICAL  CHIEF COMPLAINT:  Chief Complaint  Patient presents with   Follow-up    Rm 16, alone. Here for 4 month MS f/u, on Mavenclad and tolerating well. Pt feels her walking is getting worse. Has been having near falls. Did have a fall 3 weeks ago. Ambulates with cane.     HISTORY OF PRESENT ILLNESS:  Cynthia Nichols is a 61 y.o. woman with multiple sclerosis.  She had no other exacerbations.     Update 10/07/2021: [name] was initiated late June 2022 and second month July 24 - 04/16/21.    She felt very tired and achy the second week but was fine a few days later.      Lymphocytes were 0.6 in 05/2021.   She feels her gait and strength are a little worse.   She has had a few falls.   Right leg is weaker than left.  She uses her right AFO and a cane.   Around the house she often does not need cane.   She denies much spasticity in her right leg.   She has pain in her back, hips and legs, probably worse on the right. She has numbness and tingling in her feet.    She has urge incontinence / nocturia.   Oxybutynin was poorly tolerated.   She would prefer not to take another pill (we had discussed Myrbetriq).   She has 2x nocturia.    She has fatigue helped a bit by Adderall.  She sleeps poorly.  Ambien CR 6.25 mg helps her fall asleep but she has some sleep maintenance issues.   Hip pain is worse at night.   She has anxiety > depression.   She notes has cognitive issue.  Word finding is difficult at times.   She has trouble with parallel processing.     She has LBP, worse with prolonged standing.    MRI in 2018 was normal by report.   She broke her right humerus in a fall in 2018.   Also has had right ankle fracture.    She  has migraine headaches well controlled on zonisamide and Emgality,   She has non-migraine headaches more than migraine  MS History:  She had the onset of right leg weakness one day in 2011.  Symptoms did not improve.  However, she began to have bladder issues a few years earlier.    She initially was seen at [facility].  MRI of the cervical spine 11/12/2009 showed only the one spot at [facility].   She had EP studies (normal) and CSF analysis which was reporgtedly c/w MS.   She was placed on Copaxone.  She had episodes of diplopia which were fleeting.   She was switched to Tecfidera bit had trouble tolerating it.   After several years, she switched to Tysabri.   She felt great after the first dose but felt poorly after the second dose so she stopped.   About 2 years ago, she started Mayzent but stopped after 3 months due to feeling poorly.   Though no relapses after she stopped, gait has slowly worsened.  Mavenclad was initiated in June 022.      Imaging reports (actual images have been requested but were not available at the time of the visit):  MRI of the brain report 06/14/2019 "No significant change nonenhancing white matter foci compatible with multiple sclerosis. No new lesion"  MRI of the cervical spine 06/14/2019 report "T2 signal: Dorsal lateral right at C1 stable. Right ventrolateral. C4 Which may or may not have been present previously. Bilateral ventral cord left greater than right as seen previously adjacent to extrusion as per below, question right progression versus artifact... Right lateral C6 stable.. Large longitudinal focus left  T2 stable on sagittal images.    Degenerative change with a large disc osteophyte complex at C5-C6 to the right with moderate bilateral foraminal narrowing and moderate spinal stenosis.  milder problems at other levels.   REVIEW OF SYSTEMS: Constitutional: No fevers, chills, sweats, or change in appetite Eyes: No visual changes, double vision, eye pain Ear, nose and  throat: No hearing loss, ear pain, nasal congestion, sore throat Cardiovascular: No chest pain, palpitations Respiratory:  No shortness of breath at rest or with exertion.   No wheezes GastrointestinaI: No nausea, vomiting, diarrhea, abdominal pain, fecal incontinence Genitourinary:  No dysuria, urinary retention or frequency.  No nocturia. Musculoskeletal:  No neck pain, back pain Integumentary: No rash, pruritus, skin lesions Neurological: as above Psychiatric: No depression at this time.  No anxiety Endocrine: No palpitations, diaphoresis, change in appetite, change in weigh or increased thirst Hematologic/Lymphatic:  No anemia, purpura, petechiae. Allergic/Immunologic: No itchy/runny eyes, nasal congestion, recent allergic reactions, rashes  ALLERGIES: Allergies  Allergen Reactions   Ampyra [Dalfampridine] Anaphylaxis and Shortness Of Breath    Chest pain, also    Imitrex [Sumatriptan] Other (See Comments)    Chest tightness   Crestor [Rosuvastatin Calcium] Other (See Comments)    Abdominal pain, shortness of breath   Peanut-Containing Drug Products Itching    Itching in the mouth (remarked that she still eats this in limited amounts)   Shrimp [Shellfish Allergy] Itching    Itching in the mouth (remarked that she still eats this in limited amounts)    HOME MEDICATIONS:  Current Outpatient Medications:    albuterol (PROVENTIL) (2.5 MG/3ML) 0.083% nebulizer solution, USE 1 VIAL VIA NEBULIZER EVERY 4 TO 6 HOURS AS NEEDED FOR SHORTNESS OF BREATH /WHEEZING, Disp: 225 mL, Rfl: 0   bisoprolol (ZEBETA) 5 MG tablet, Take 0.5-1 tablets (2.5-5 mg total) by mouth daily for elevated blood pressure, Disp: 30 tablet, Rfl: 2   buPROPion (WELLBUTRIN) 75 MG tablet, Take 1 tablet (75 mg total) by mouth once daily, Disp: 30 tablet, Rfl: 11   candesartan (ATACAND) 8 MG tablet, Take 1 tablet (8 mg total) by mouth daily. (Patient taking differently: Take 8 mg by mouth daily as needed.), Disp: 90  tablet, Rfl: 1   Cholecalciferol (VITAMIN D) 50 MCG (2000 UT) CAPS, Take 5,000 Units by mouth daily., Disp: , Rfl:    Cladribine, 8 Tabs, (MAVENCLAD, 8 TABS,) 10 MG TBPK, Take by mouth. Month one: Day 1-3: 2 tabs/day, Day 4 and 5: 1 tab/day Month two: Day 1 and 2: 2 tabs/day, Days 3-5: 1 tab/day, Disp: , Rfl:    ergocalciferol (VITAMIN D2) 1.25 MG (50000 UT) capsule, Take 1 capsule (50,000 Units total) by mouth once a week., Disp: 24 capsule, Rfl: 0   gabapentin (NEURONTIN) 600 MG tablet, Take 1 tablet (600 mg total) by mouth every evening. (Patient taking differently: Take 300 mg by mouth every evening.), Disp: 30 tablet, Rfl: 5   Galcanezumab-gnlm (EMGALITY) 120 MG/ML SOAJ, Inject 120 mg into the skin every 28 (twenty-eight) days., Disp: 1 mL, Rfl:  6   Galcanezumab-gnlm (EMGALITY) 120 MG/ML SOAJ, Inject 120 mg into the skin every 28 (twenty-eight) days., Disp: 3 mL, Rfl: 3   hydrochlorothiazide (HYDRODIURIL) 12.5 MG tablet, Take by mouth., Disp: , Rfl:    hydrochlorothiazide (MICROZIDE) 12.5 MG capsule, Take 1-2 capsules (12.5-25 mg total) by mouth daily for blood pressure, Disp: 60 capsule, Rfl: 3   omeprazole (PRILOSEC) 20 MG capsule, Take 1 capsule (20 mg total) by mouth daily 30 minutes before morning meal, Disp: 30 capsule, Rfl: 1   pravastatin (PRAVACHOL) 20 MG tablet, TAKE 1 TABLET BY MOUTH DAILY, Disp: 30 tablet, Rfl: 6   pravastatin (PRAVACHOL) 20 MG tablet, Take 1 tablet (20 mg total) by mouth daily., Disp: 90 tablet, Rfl: 1   zolpidem (AMBIEN CR) 6.25 MG CR tablet, Take 1 tablet by mouth at bedtime, Disp: 30 tablet, Rfl: 3   zolpidem (AMBIEN CR) 6.25 MG CR tablet, Take 1 tablet by mouth at bedtime, Disp: 30 tablet, Rfl: 3   zonisamide (ZONEGRAN) 100 MG capsule, Take 200 mg by mouth at bedtime., Disp: , Rfl:    zonisamide (ZONEGRAN) 100 MG capsule, TAKE 2 CAPSULES BY MOUTH AT BEDTIME AFTER USING 25MG  CAPS TO INCREASE GRADUALLY, Disp: 180 capsule, Rfl: 6   amphetamine-dextroamphetamine  (ADDERALL) 10 MG tablet, Take 1 tablet (10 mg total) by mouth 2 (two) times daily., Disp: 60 tablet, Rfl: 0   potassium chloride SA (KLOR-CON M20) 20 MEQ tablet, Take 1 tablet (20 mEq total) by mouth 2 (two) times daily with food for 7 days for hypokalemia, THEN 1 tablet (20 mEq total) daily., Disp: 37 tablet, Rfl: 0   zolpidem (AMBIEN CR) 6.25 MG CR tablet, TAKE 1 TABLET BY MOUTH AT BEDTIME, Disp: 30 tablet, Rfl: 3  PAST MEDICAL HISTORY: Past Medical History:  Diagnosis Date   Adopted    Patient has no known family history   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   Aortic atherosclerosis ([place]) 11/29/2017   Asthma    Asthma 11/02/2019   Closed fracture of shaft of right humerus 05/13/2017   Closed low lateral malleolus fracture, left, initial encounter 10/17/2018   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   Essential hypertension, benign 11/29/2017   Fracture of fifth metatarsal bone 08/13/2015   Greater trochanteric bursitis, left 12/31/2019   Injection given December 31, 2019   Headache(784.0)    Humerus fracture    right with radial nerve palsy   Hypertension    IBS (irritable bowel syndrome)    Migraine without aura and without status migrainosus, not intractable 02/09/2018   MS (multiple sclerosis) ([place])    Multiple sclerosis, relapsing-remitting ([place]) 12/14/2011   Osteoporosis 11/29/2017   Other fracture of shaft of right humerus, initial encounter for closed fracture 05/13/2017   Patella fracture 07/03/2015   Hairline oblique     Patellar subluxation, left, initial encounter 08/14/2018   PONV (postoperative nausea and vomiting)    Radial nerve palsy, 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   Right-sided    Unspecified venous (peripheral) insufficiency 11/09/2011   Venous aneurysm    Right side of neck    PAST SURGICAL HISTORY: Past Surgical History:  Procedure Laterality Date   ABDOMINAL HYSTERECTOMY     BREAST MASS EXCISION     Bilateral breast mass  excision ( Benign)   ORIF HUMERUS FRACTURE Right 05/17/2017   TENNIS ELBOW RELEASE/NIRSCHEL PROCEDURE Right 05/17/2017   Procedure: EXPLORATION AND EXPOSURE OF RADIAL NERVE RIGHT ARM;  Surgeon: Roseanne Kaufman,  MD;  Location: [place];  Service: Orthopedics;  Laterality: Right;    FAMILY HISTORY: Family History  Adopted: Yes    SOCIAL HISTORY:  Social History   Socioeconomic History   Marital status: Married    Spouse name: Shatarra Wehling   Number of children: 3   Years of education: Not on file   Highest education level: Associate degree: occupational, Hotel manager, or vocational program  Occupational History   Occupation: disabled    Comment: Therapist, sports  Tobacco Use   Smoking status: Never   Smokeless tobacco: Never  Vaping Use   Vaping Use: Never used  Substance and Sexual Activity   Alcohol use: No    Alcohol/week: 0.0 standard drinks   Drug use: No   Sexual activity: Yes    Partners: Male  Other Topics Concern   Not on file  Social History Narrative   Not on file   Social Determinants of Health   Financial Resource Strain: Not on file  Food Insecurity: Not on file  Transportation Needs: Not on file  Physical Activity: Not on file  Stress: Not on file  Social Connections: Not on file  Intimate Partner Violence: Not on file     PHYSICAL EXAM  Vitals:   10/07/21 1309  BP: 130/81  Pulse: 67  Weight: 173 lb 3.2 oz (78.6 kg)  Height: 5\' 7"  (1.702 m)    Body mass index is 27.13 kg/m.   General: The patient is well-developed and well-nourished and in no acute distress  HEENT:  Head is [place]/AT.  Sclera are anicteric.  Funduscopic exam shows normal optic discs and retinal vessels.  Neck: the neck is nontender.  Skin: Extremities are without rash or  edema.  Musculoskeletal:  Back is nontender  Neurologic Exam  Mental status: The patient is alert and oriented x 3 at the time of the examination. The patient has apparent normal recent and remote memory, with an  apparently normal attention span and concentration ability.   Speech is normal.  Cranial nerves: Extraocular movements are full.    There is good facial sensation to soft touch bilaterally.Facial strength is normal.  No dysarthria is noted.   e. No obvious hearing deficits are noted.  Motor:  Muscle bulk is normal.   Tone is increased in the right leg.   Strength is 4-/5 right hip flexors, 4+/5 quads, 4/5 hamstrings and 2+/5 toe/ankle extension.   Strength is  5 / 5 elsewhere.   Sensory: Sensory testing is intact to pinprick, soft touch and vibration sensation in all 4 extremities.  Coordination: Cerebellar testing reveals good finger-nose-finger and reduced heel to shin worse on right  Gait and station: Station is normal.   She has a wide based gait with a right foot drop.  She cannot tandem walk. Romberg is negative.   Reflexes: Deep tendon reflexes are symmetric and normal in arms, slightly increased right leg.        DIAGNOSTIC DATA (LABS, IMAGING, TESTING) - I reviewed patient records, labs, notes, testing and imaging myself where available.  Lab Results  Component Value Date   WBC 4.4 06/04/2021   HGB 13.2 06/04/2021   HCT 38.9 06/04/2021   MCV 94 06/04/2021   PLT 207 06/04/2021      Component Value Date/Time   NA 142 02/17/2021 1433   K 3.8 02/17/2021 1433   CL 102 02/17/2021 1433   CO2 23 02/17/2021 1433   GLUCOSE 106 (H) 02/17/2021 1433   GLUCOSE 120 (H)  07/11/2020 1708   BUN 16 02/17/2021 1433   CREATININE 0.85 02/17/2021 1433   CREATININE 0.59 02/09/2018 0930   CALCIUM 10.6 (H) 02/17/2021 1433   PROT 7.4 06/04/2021 1515   ALBUMIN 4.6 06/04/2021 1515   AST 23 06/04/2021 1515   ALT 34 (H) 06/04/2021 1515   ALKPHOS 104 06/04/2021 1515   BILITOT 1.2 06/04/2021 1515   GFRNONAA >60 07/11/2020 1708   GFRNONAA 84 12/14/2017 0945   GFRAA 98 12/14/2017 0945    Lab Results  Component Value Date   TSH 1.07 03/09/2017       ASSESSMENT AND PLAN  MS (multiple  sclerosis) ([place]) - Plan: CBC with Differential/Platelet, Hepatic function panel, MR BRAIN W WO CONTRAST, MR CERVICAL SPINE W WO CONTRAST  Gait disturbance - Plan: MR BRAIN W WO CONTRAST, MR CERVICAL SPINE W WO CONTRAST  Urinary urgency - Plan: MR BRAIN W WO CONTRAST, MR CERVICAL SPINE W WO CONTRAST  High risk medication use - Plan: CBC with Differential/Platelet, Hepatic function panel   She tolerated the first year of [place] .  We will check 6 month labs.  MRI of the brian and cervical spine to determine if subclinical progression.  If occurring, we will need to consider a different DMT.   Needs conscious sedation due to extreme claustophobia.   We discussedactive SPMS and expectations of medicaitons (prevent new lesions).  Unfortunately medications are not too effective against the slow progression. Stay active and exercise as tolerated. Rtc 4 months or sooner if new or worsening issues    Joneisha Miles A. Felecia Shelling, MD, [facility] 2/40/9735, 3:29 PM Certified in Neurology, Clinical Neurophysiology, Sleep Medicine and Neuroimaging  [facility] [address], [place], [place] [address] [phone]

## 2021-10-08 LAB — CBC WITH DIFFERENTIAL/PLATELET
Basophils Absolute: 0.1 10*3/uL (ref 0.0–0.2)
Basos: 1 %
EOS (ABSOLUTE): 0.1 10*3/uL (ref 0.0–0.4)
Eos: 1 %
Hematocrit: 40.3 % (ref 34.0–46.6)
Hemoglobin: 14 g/dL (ref 11.1–15.9)
Immature Grans (Abs): 0 10*3/uL (ref 0.0–0.1)
Immature Granulocytes: 0 %
Lymphocytes Absolute: 0.8 10*3/uL (ref 0.7–3.1)
Lymphs: 13 %
MCH: 31.2 pg (ref 26.6–33.0)
MCHC: 34.7 g/dL (ref 31.5–35.7)
MCV: 90 fL (ref 79–97)
Monocytes Absolute: 0.4 10*3/uL (ref 0.1–0.9)
Monocytes: 6 %
Neutrophils Absolute: 5 10*3/uL (ref 1.4–7.0)
Neutrophils: 79 %
Platelets: 235 10*3/uL (ref 150–450)
RBC: 4.49 x10E6/uL (ref 3.77–5.28)
RDW: 12.4 % (ref 11.7–15.4)
WBC: 6.4 10*3/uL (ref 3.4–10.8)

## 2021-10-08 LAB — HEPATIC FUNCTION PANEL
ALT: 29 IU/L (ref 0–32)
AST: 18 IU/L (ref 0–40)
Albumin: 4.7 g/dL (ref 3.8–4.9)
Alkaline Phosphatase: 115 IU/L (ref 44–121)
Bilirubin Total: 0.8 mg/dL (ref 0.0–1.2)
Bilirubin, Direct: 0.2 mg/dL (ref 0.00–0.40)
Total Protein: 7.6 g/dL (ref 6.0–8.5)

## 2021-10-12 NOTE — Telephone Encounter (Signed)
Put H&P forms in Dr. Garth Bigness box to be filled out and signed.

## 2021-10-13 ENCOUNTER — Other Ambulatory Visit (HOSPITAL_COMMUNITY): Payer: Self-pay

## 2021-10-14 ENCOUNTER — Other Ambulatory Visit (HOSPITAL_COMMUNITY): Payer: Self-pay

## 2021-10-14 MED ORDER — ZOLPIDEM TARTRATE ER 6.25 MG PO TBCR
6.2500 mg | EXTENDED_RELEASE_TABLET | Freq: Every day | ORAL | 3 refills | Status: DC
  Filled 2021-10-14: qty 30, 30d supply, fill #0
  Filled 2021-11-12: qty 30, 30d supply, fill #1
  Filled 2021-12-13: qty 30, 30d supply, fill #2
  Filled 2022-01-10: qty 30, 30d supply, fill #3

## 2021-10-23 ENCOUNTER — Other Ambulatory Visit (HOSPITAL_COMMUNITY): Payer: Self-pay

## 2021-10-23 MED ORDER — NARATRIPTAN HCL 2.5 MG PO TABS
ORAL_TABLET | ORAL | 11 refills | Status: DC
  Filled 2021-10-23: qty 10, 30d supply, fill #0

## 2021-10-23 MED ORDER — GABAPENTIN 400 MG PO CAPS
400.0000 mg | ORAL_CAPSULE | Freq: Every evening | ORAL | 11 refills | Status: DC
  Filled 2021-10-23: qty 30, 30d supply, fill #0
  Filled 2021-12-13: qty 30, 30d supply, fill #1
  Filled 2022-01-10: qty 30, 30d supply, fill #2
  Filled 2022-02-11: qty 30, 30d supply, fill #3

## 2021-10-23 MED ORDER — EMGALITY 120 MG/ML ~~LOC~~ SOAJ
120.0000 mg | SUBCUTANEOUS | 3 refills | Status: DC
  Filled 2021-10-23 – 2021-11-20 (×2): qty 1, 30d supply, fill #0

## 2021-10-29 ENCOUNTER — Other Ambulatory Visit (HOSPITAL_COMMUNITY): Payer: Self-pay

## 2021-10-29 NOTE — Progress Notes (Deleted)
° ° °  Cynthia Nichols is a 61 y.o. female who presents to [place] at [facility] today for L hip pain.  She was last seen by Dr. Tamala Julian on 12/31/19 for L knee and L hip pain.  Pt has a hx of MS.  Today, pt reports    Pertinent review of systems: ***  Relevant historical information: ***   Exam:  There were no vitals taken for this visit. General: Well Developed, well nourished, and in no acute distress.   MSK: ***    Lab and Radiology Results No results found for this or any previous visit (from the past 72 hour(s)). No results found.     Assessment and Plan: 61 y.o. female with ***   PDMP not reviewed this encounter. No orders of the defined types were placed in this encounter.  No orders of the defined types were placed in this encounter.    Discussed warning signs or symptoms. Please see discharge instructions. Patient expresses understanding.   ***

## 2021-10-30 ENCOUNTER — Other Ambulatory Visit (HOSPITAL_COMMUNITY): Payer: Self-pay

## 2021-10-30 ENCOUNTER — Ambulatory Visit: Payer: PPO | Admitting: Family Medicine

## 2021-11-02 ENCOUNTER — Other Ambulatory Visit: Payer: Self-pay

## 2021-11-02 ENCOUNTER — Encounter (HOSPITAL_COMMUNITY): Payer: Self-pay | Admitting: Anesthesiology

## 2021-11-02 ENCOUNTER — Encounter (HOSPITAL_COMMUNITY): Payer: Self-pay | Admitting: *Deleted

## 2021-11-02 NOTE — Progress Notes (Addendum)
Spoke with pt for pre-op call. Pt denies cardiac history and diabetes. Pt is treated for HTN.  History of MS.   Pt's surgery is scheduled as ambulatory so no Covid test is required prior to surgery.

## 2021-11-02 NOTE — Anesthesia Preprocedure Evaluation (Deleted)
Anesthesia Evaluation  Patient identified by MRN, date of birth, ID band Patient awake    Reviewed: Allergy & Precautions, NPO status , Patient's Chart, lab work & pertinent test results  Airway Mallampati: III  TM Distance: >3 FB Neck ROM: Full    Dental no notable dental hx.    Pulmonary asthma ,    Pulmonary exam normal breath sounds clear to auscultation       Cardiovascular hypertension, Pt. on medications and Pt. on home beta blockers Normal cardiovascular exam Rhythm:Regular Rate:Normal  ECG: NSR, rate 63   Neuro/Psych  Headaches, PSYCHIATRIC DISORDERS right with radial nerve palsy    GI/Hepatic negative GI ROS, Neg liver ROS,   Endo/Other  negative endocrine ROS  Renal/GU negative Renal ROS     Musculoskeletal  (+) Arthritis ,   Abdominal   Peds  Hematology negative hematology ROS (+)   Anesthesia Other Findings MS GAIT DISTURBANCE  Reproductive/Obstetrics                           Anesthesia Physical Anesthesia Plan  ASA: 2  Anesthesia Plan: General   Post-op Pain Management:    Induction: Intravenous  PONV Risk Score and Plan: 3 and Ondansetron, Dexamethasone, Midazolam and Treatment may vary due to age or medical condition  Airway Management Planned: Oral ETT and Video Laryngoscope Planned  Additional Equipment:   Intra-op Plan:   Post-operative Plan: Extubation in OR  Informed Consent: I have reviewed the patients History and Physical, chart, labs and discussed the procedure including the risks, benefits and alternatives for the proposed anesthesia with the patient or authorized representative who has indicated his/her understanding and acceptance.     Dental advisory given  Plan Discussed with: CRNA  Anesthesia Plan Comments:         Anesthesia Quick Evaluation

## 2021-11-03 ENCOUNTER — Ambulatory Visit (HOSPITAL_COMMUNITY): Payer: PPO

## 2021-11-03 ENCOUNTER — Ambulatory Visit (HOSPITAL_COMMUNITY)
Admission: RE | Admit: 2021-11-03 | Discharge: 2021-11-03 | Disposition: A | Discharge status: Home / Self Care | Payer: PPO | Attending: Neurology | Admitting: Neurology

## 2021-11-03 ENCOUNTER — Encounter (HOSPITAL_COMMUNITY): Admission: RE | Disposition: A | Discharge status: Home / Self Care | Payer: Self-pay | Source: Home / Self Care

## 2021-11-03 ENCOUNTER — Encounter (HOSPITAL_COMMUNITY): Payer: Self-pay

## 2021-11-03 ENCOUNTER — Ambulatory Visit (HOSPITAL_COMMUNITY): Admission: RE | Admit: 2021-11-03 | Payer: PPO | Source: Ambulatory Visit

## 2021-11-03 DIAGNOSIS — Z5320 Procedure and treatment not carried out because of patient's decision for unspecified reasons: Secondary | ICD-10-CM | POA: Diagnosis not present

## 2021-11-03 HISTORY — PX: RADIOLOGY WITH ANESTHESIA: SHX6223

## 2021-11-03 SURGERY — MRI WITH ANESTHESIA
Anesthesia: General

## 2021-11-03 MED ORDER — CHLORHEXIDINE GLUCONATE 0.12 % MT SOLN
15.0000 mL | Freq: Once | OROMUCOSAL | Status: DC
  Filled 2021-11-03: qty 15

## 2021-11-03 MED ORDER — ORAL CARE MOUTH RINSE: 15.0000 mL | Freq: Once | OROMUCOSAL | Status: DC

## 2021-11-03 MED ORDER — LACTATED RINGERS IV SOLN: INTRAVENOUS | Status: DC

## 2021-11-03 NOTE — Progress Notes (Addendum)
Patient called this Probation officer and asked to cancel the procedure for today; after talking with the anesthesia she decided to not have MRI today. Anesthesia was notified about patient's decision. Patient was instructed to contact MD. Patient verbalized understanding and left. No acute distress noted at this time, no complaints.

## 2021-11-04 ENCOUNTER — Encounter (HOSPITAL_COMMUNITY): Payer: Self-pay | Admitting: Radiology

## 2021-11-10 NOTE — H&P (Signed)
See note from 10/07/2021

## 2021-11-13 ENCOUNTER — Other Ambulatory Visit (HOSPITAL_COMMUNITY): Payer: Self-pay

## 2021-11-20 ENCOUNTER — Other Ambulatory Visit (HOSPITAL_COMMUNITY): Payer: Self-pay

## 2021-11-26 NOTE — Progress Notes (Signed)
Cynthia Nichols [facility] [address] [place] Phone: [phone] Subjective:   [name], am serving as a scribe for Dr. Hulan Saas. This visit occurred during the SARS-CoV-2 public health emergency.  Safety protocols were in place, including screening questions prior to the visit, additional usage of staff PPE, and extensive cleaning of exam room while observing appropriate contact time as indicated for disinfecting solutions.   I'm seeing this patient by the request  of:  Cynthia Able, FNP  CC: Left hip pain, back pain  GDJ:[id number]  Cynthia Nichols is a 61 y.o. female coming in with complaint of L hip pain. Last seen in 2021 for GT bursitis in L hip. History of MS. Patient states pain comes and goes. Pain can wake her at night. Pain mainly on left side over SI joint. Will get radiating pain down left leg. Ibuprofen helps take the edge off.  Also complaining of increasing instability of the right knee.  Patient states that it is fairly severe overall.  Starting to affect daily activities.  Using a cane on a more regular basis secondary to the weakness.  Feels like the knee is going to give out on her on a regular basis.     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   Essential hypertension, benign 11/29/2017   Fracture of fifth metatarsal bone 08/13/2015   Greater trochanteric bursitis, left 12/31/2019   Injection given December 31, 2019   Headache(784.0)    Humerus fracture    right with radial nerve palsy   Hypertension    IBS (irritable bowel syndrome)     Migraine without aura and without status migrainosus, not intractable 02/09/2018   MS (multiple sclerosis) ([place])    Multiple sclerosis, relapsing-remitting ([place]) 12/14/2011   Osteoporosis 11/29/2017   Other fracture of shaft of right humerus, initial encounter for closed fracture 05/13/2017   Patella fracture 07/03/2015   Hairline oblique     Patellar subluxation, left, initial encounter 08/14/2018   PONV (postoperative nausea and vomiting)    only on this surgery   Radial nerve palsy, 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   Right-sided    Unspecified venous (peripheral) insufficiency 11/09/2011   Venous aneurysm    Right side of neck   Past Surgical History:  Procedure Laterality Date   ABDOMINAL HYSTERECTOMY     BREAST MASS EXCISION     Bilateral breast mass excision ( Benign)   ORIF HUMERUS FRACTURE Right 05/17/2017   RADIOLOGY WITH ANESTHESIA N/A 11/03/2021   Procedure: MRI WITH BRAIN WITH AND WITHOUT,CERVICAL WITH AND WITHOUT CONTRAST;  Surgeon: Radiologist, Medication, MD;  Location: [facility];  Service: Radiology;  Laterality: N/A;   TENNIS ELBOW RELEASE/NIRSCHEL PROCEDURE Right 05/17/2017   Procedure: EXPLORATION AND EXPOSURE OF RADIAL NERVE RIGHT ARM;  Surgeon: Roseanne Kaufman, MD;  Location: [facility];  Service: Orthopedics;  Laterality: Right;   Social History   Socioeconomic History   Marital status: Married    Spouse name: Merrit Waugh   Number of children: 3  Years of education: Not on file   Highest education level: Associate degree: occupational, Hotel manager, or vocational program  Occupational History   Occupation: disabled    Comment: Therapist, sports  Tobacco Use   Smoking status: Never   Smokeless tobacco: Never  Vaping Use   Vaping Use: Never used  Substance and Sexual Activity   Alcohol use: No    Alcohol/week: 0.0 standard drinks   Drug use: No   Sexual activity: Yes    Partners: Male  Other Topics Concern   Not on file  Social History Narrative    Not on file   Social Determinants of Health   Financial Resource Strain: Not on file  Food Insecurity: Not on file  Transportation Needs: Not on file  Physical Activity: Not on file  Stress: Not on file  Social Connections: Not on file   Allergies  Allergen Reactions   Ampyra [Dalfampridine] Anaphylaxis and Shortness Of Breath    Chest pain, also    Imitrex [Sumatriptan] Other (See Comments)    Chest tightness   Crestor [Rosuvastatin Calcium] Other (See Comments)    Abdominal pain, shortness of breath   Peanut-Containing Drug Products Itching    Itching in the mouth (remarked that she still eats this in limited amounts)   Shrimp [Shellfish Allergy] Itching    Itching in the mouth (remarked that she still eats this in limited amounts)   Family History  Adopted: Yes     Current Outpatient Medications (Cardiovascular):    bisoprolol (ZEBETA) 5 MG tablet, Take 0.5-1 tablets (2.5-5 mg total) by mouth daily for elevated blood pressure (Patient taking differently: Take 2.5 mg by mouth daily.)   candesartan (ATACAND) 8 MG tablet, Take 1 tablet (8 mg total) by mouth daily. (Patient taking differently: Take 4 mg by mouth daily as needed (Blood pressure).)   hydrochlorothiazide (MICROZIDE) 12.5 MG capsule, Take 1-2 capsules (12.5-25 mg total) by mouth daily for blood pressure (Patient taking differently: Take 12.5 mg by mouth daily.)   pravastatin (PRAVACHOL) 20 MG tablet, TAKE 1 TABLET BY MOUTH DAILY (Patient taking differently: Take 20 mg by mouth at bedtime.)  Current Outpatient Medications (Respiratory):    albuterol (PROVENTIL) (2.5 MG/3ML) 0.083% nebulizer solution, USE 1 VIAL VIA NEBULIZER EVERY 4 TO 6 HOURS AS NEEDED FOR SHORTNESS OF BREATH Ellie Lunch  Current Outpatient Medications (Analgesics):    Galcanezumab-gnlm (EMGALITY) 120 MG/ML SOAJ, Inject 120 mg into the skin every 30 (thirty) days.   naratriptan (AMERGE) 2.5 MG tablet, Take 1 tablet (2.5 mg total) by mouth as  directed for Migraine. May take a second dose after 4 hours if needed.   Current Outpatient Medications (Other):    amphetamine-dextroamphetamine (ADDERALL) 10 MG tablet, Take 1 tablet (10 mg total) by mouth 2 (two) times daily. (Patient taking differently: Take 10 mg by mouth daily as needed (MS).)   buPROPion (WELLBUTRIN) 75 MG tablet, Take 1 tablet (75 mg total) by mouth once daily   Cholecalciferol (VITAMIN D) 50 MCG (2000 UT) CAPS, Take 5,000 Units by mouth every 30 (thirty) days.   Cladribine, 8 Tabs, (MAVENCLAD, 8 TABS,) 10 MG TBPK, Take by mouth See admin instructions. Take once a year will start in July  Month one: Day 1-3: 2 tabs/day, Day 4 and 5: 1 tab/day Month two: Day 1 and 2: 2 tabs/day, Days 3-5: 1 tab/day   ergocalciferol (VITAMIN D2) 1.25 MG (50000 UT) capsule, Take 1 capsule (50,000 Units total) by mouth once a week.   gabapentin (NEURONTIN) 400  MG capsule, Take 1 capsule (400 mg total) by mouth at bedtime.   omeprazole (PRILOSEC) 20 MG capsule, Take 1 capsule (20 mg total) by mouth daily 30 minutes before morning meal (Patient not taking: Reported on 10/29/2021)   potassium chloride SA (KLOR-CON M20) 20 MEQ tablet, Take 1 tablet (20 mEq total) by mouth 2 (two) times daily with food for 7 days for hypokalemia, THEN 1 tablet (20 mEq total) daily. (Patient taking differently: Take 10 meq daily)   zolpidem (AMBIEN CR) 6.25 MG CR tablet, Take 1 tablet (6.25 mg total) by mouth at bedtime.   zonisamide (ZONEGRAN) 100 MG capsule, TAKE 2 CAPSULES BY MOUTH AT BEDTIME AFTER USING '25MG'$  CAPS TO INCREASE GRADUALLY (Patient taking differently: 100 mg at bedtime.)   Reviewed prior external information including notes and imaging from  primary care provider As well as notes that were available from care everywhere and other healthcare systems.  Past medical history, social, surgical and family history all reviewed in electronic medical record.  No pertanent information unless stated regarding to  the chief complaint.   Review of Systems:  No headache, visual changes, nausea, vomiting, diarrhea, constipation, dizziness, abdominal pain, skin rash, fevers, chills, night sweats, weight loss, swollen lymph nodes, body aches, joint swelling, chest pain, shortness of breath, mood changes. POSITIVE muscle aches  Objective  Blood pressure 118/78, pulse 71, height '5\' 7"'$  (1.702 m), weight 169 lb (76.7 kg), SpO2 98 %.   General: No apparent distress alert and oriented x3 mood and affect normal, dressed appropriately.  HEENT: Pupils equal, extraocular movements intact  Respiratory: Patient's speak in full sentences and does not appear short of breath  Cardiovascular: No lower extremity edema, non tender, no erythema  Gait severely antalgic MSK: Patient does have weakness of the lower extremities bilaterally.  Patient does have instability of the right knee especially with valgus and varus force.  Due to muscle atrophy of the side patient does have abnormal thigh to calf ratio.  Severe tenderness to palpation of the left hip.  Patient does have relatively good range of motion.  Negative straight leg test.  Tenderness to palpation over the sacroiliac joint as well.    Procedure: Real-time Ultrasound Guided Injection of left  greater trochanteric bursitis secondary to patient's body habitus Device: GE Logiq Q7  Ultrasound guided injection is preferred based studies that show increased duration, increased effect, greater accuracy, decreased procedural pain, increased response rate, and decreased cost with ultrasound guided versus blind injection.  Verbal informed consent obtained.  Time-out conducted.  Noted no overlying erythema, induration, or other signs of local infection.  Skin prepped in a sterile fashion.  Local anesthesia: Topical Ethyl chloride.  With sterile technique and under real time ultrasound guidance:  Greater trochanteric area was visualized and patient's bursa was noted. A 22-gauge  3 inch needle was inserted and 2 cc of 0.5% Marcaine and 1 cc of Kenalog 40 mg/dL was injected. Pictures taken Completed without difficulty  Pain immediately resolved suggesting accurate placement of the medication.  Advised to call if fevers/chills, erythema, induration, drainage, or persistent bleeding.  Impression: Technically successful ultrasound guided injection.    Impression and Recommendations:    The above documentation has been reviewed and is accurate and complete Lyndal Pulley, DO

## 2021-11-27 ENCOUNTER — Ambulatory Visit (INDEPENDENT_AMBULATORY_CARE_PROVIDER_SITE_OTHER): Payer: PPO

## 2021-11-27 ENCOUNTER — Ambulatory Visit: Payer: Self-pay

## 2021-11-27 ENCOUNTER — Ambulatory Visit: Payer: PPO | Admitting: Family Medicine

## 2021-11-27 ENCOUNTER — Other Ambulatory Visit: Payer: Self-pay

## 2021-11-27 VITALS — BP 118/78 | HR 71 | Ht 67.0 in | Wt 169.0 lb

## 2021-11-27 DIAGNOSIS — M25552 Pain in left hip: Secondary | ICD-10-CM

## 2021-11-27 DIAGNOSIS — M1711 Unilateral primary osteoarthritis, right knee: Secondary | ICD-10-CM | POA: Diagnosis not present

## 2021-11-27 DIAGNOSIS — M533 Sacrococcygeal disorders, not elsewhere classified: Secondary | ICD-10-CM | POA: Diagnosis not present

## 2021-11-27 DIAGNOSIS — M21371 Foot drop, right foot: Secondary | ICD-10-CM

## 2021-11-27 DIAGNOSIS — M7062 Trochanteric bursitis, left hip: Secondary | ICD-10-CM

## 2021-11-27 NOTE — Patient Instructions (Signed)
Xrays today ?Do prescribed exercises at least 3x a week ?See you again in 4 weeks ?

## 2021-11-27 NOTE — Assessment & Plan Note (Signed)
Repeat injection in the neck.  Tolerated procedure well.  Patient does have underlying multiple sclerosis with weakness of the lower extremities that we will need to continue to monitor.  Depending on this we may need advanced imaging.  X-rays of the lumbar spine in the left hip ordered today to make sure there is no insufficiency fracture.  Follow-up with me again in 4 to 6 weeks. ?

## 2021-11-27 NOTE — Assessment & Plan Note (Signed)
Patient does have instability of the right knee noted that is likely secondary to some of the arthritic changes in the knee as well as likely weakness secondary to patient's underlying other comorbidities.  Due to the instability and abnormal thigh to calf ratio I do feel that advanced custom OA brace would help patient continue to stay autonomous.  Follow-up with me again in 4 to 6 weeks ?

## 2021-11-30 ENCOUNTER — Encounter: Payer: Self-pay | Admitting: Family Medicine

## 2021-11-30 ENCOUNTER — Other Ambulatory Visit (HOSPITAL_COMMUNITY): Payer: Self-pay

## 2021-12-01 ENCOUNTER — Other Ambulatory Visit: Payer: Self-pay

## 2021-12-01 MED ORDER — AMBULATORY NON FORMULARY MEDICATION: 1.0000 [IU] | Freq: Once | 0 refills | Status: AC

## 2021-12-02 ENCOUNTER — Telehealth: Payer: Self-pay | Admitting: Family Medicine

## 2021-12-02 DIAGNOSIS — M21371 Foot drop, right foot: Secondary | ICD-10-CM | POA: Insufficient documentation

## 2021-12-02 NOTE — Telephone Encounter (Signed)
[facility] called. They recd our fax about a brace for this pt, but cannot order the way we sent it ( medication order? ). Prefer a faxed prescription or even handwritten order faxed to [phone]. ?Do not need notes, already have them. Just need order. ?

## 2021-12-02 NOTE — Telephone Encounter (Signed)
Sent information to [employer] [facility]. ?

## 2021-12-02 NOTE — Assessment & Plan Note (Signed)
Patient does have a weakness noted that could be secondary to either some radicular symptoms in the back or patient's multiple sclerosis.  Discussed with patient about icing regimen, home exercises, I do think that a possible AFO would be beneficial and will send patient for custom bracing. ?

## 2021-12-11 ENCOUNTER — Other Ambulatory Visit (HOSPITAL_COMMUNITY): Payer: Self-pay

## 2021-12-11 MED ORDER — SPIRONOLACTONE 25 MG PO TABS
25.0000 mg | ORAL_TABLET | Freq: Every day | ORAL | 2 refills | Status: DC
  Filled 2021-12-11: qty 30, 30d supply, fill #0

## 2021-12-13 ENCOUNTER — Other Ambulatory Visit (HOSPITAL_COMMUNITY): Payer: Self-pay

## 2021-12-14 ENCOUNTER — Other Ambulatory Visit (HOSPITAL_COMMUNITY): Payer: Self-pay

## 2021-12-15 ENCOUNTER — Other Ambulatory Visit (HOSPITAL_COMMUNITY): Payer: Self-pay

## 2021-12-16 ENCOUNTER — Other Ambulatory Visit (HOSPITAL_COMMUNITY): Payer: Self-pay

## 2021-12-17 ENCOUNTER — Other Ambulatory Visit (HOSPITAL_COMMUNITY): Payer: Self-pay

## 2021-12-17 MED ORDER — BISOPROLOL FUMARATE 5 MG PO TABS
2.5000 mg | ORAL_TABLET | Freq: Every day | ORAL | 2 refills | Status: DC
  Filled 2021-12-17: qty 30, 30d supply, fill #0
  Filled 2022-02-18: qty 30, 30d supply, fill #1
  Filled 2022-04-11 – 2022-04-21 (×2): qty 30, 30d supply, fill #2

## 2021-12-18 ENCOUNTER — Other Ambulatory Visit (HOSPITAL_COMMUNITY): Payer: Self-pay

## 2021-12-24 NOTE — Progress Notes (Signed)
?Charlann Boxer D.O. ?[facility] ?[place] ?Phone: [phone] ?Subjective:   ? ?I'm seeing this patient by the request  of:  Leilani Able, FNP ? ?I, Peterson Lombard, PhD, LAT, ATC acting as a scribe for [employer], DO. ? ?CC: L hip pain ? ?[id number]  ?Cynthia Nichols is a 61 y.o. female coming in with complaint of SI, L hip, R knee pain. Patient states L hip is very painful w/ radiating pain along the anterior thigh that started about 2 days ago. Pt locates pain to the L SI joint and deep within the L buttock. No numbness/tingling noted. ? ? ?  ? ?Past Medical History:  ?Diagnosis Date  ? Adopted   ? Patient has no known family history  ? 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 Aortic atherosclerosis ([place]) 11/29/2017  ? Asthma   ? Asthma 11/02/2019  ? Closed fracture of shaft of right humerus 05/13/2017  ? Closed low lateral malleolus fracture, left, initial encounter 10/17/2018  ? COVID 07/2021  ? mild case  ? 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 Essential hypertension, benign 11/29/2017  ? Fracture of fifth metatarsal bone 08/13/2015  ? Greater trochanteric bursitis, left 12/31/2019  ? Injection given December 31, 2019  ? Headache(784.0)   ? Humerus fracture   ? right with radial nerve palsy  ? Hypertension   ? IBS (irritable bowel syndrome)   ? Migraine without aura and without status migrainosus, not intractable 02/09/2018  ? MS (multiple sclerosis) ([place])   ? Multiple sclerosis, relapsing-remitting ([place]) 12/14/2011  ? Osteoporosis 11/29/2017  ? Other fracture of shaft of right humerus, initial encounter for closed fracture 05/13/2017  ? Patella fracture 07/03/2015  ? Hairline oblique    ? Patellar subluxation, left, initial encounter 08/14/2018  ? PONV (postoperative nausea and vomiting)   ? only on this surgery  ? 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  ? Right-sided   ? Unspecified venous  (peripheral) insufficiency 11/09/2011  ? Venous aneurysm   ? Right side of neck  ? ?Past Surgical History:  ?Procedure Laterality Date  ? ABDOMINAL HYSTERECTOMY    ? BREAST MASS EXCISION    ? Bilateral breast mass excision ( Benign)  ? ORIF HUMERUS FRACTURE Right 05/17/2017  ? RADIOLOGY WITH ANESTHESIA N/A 11/03/2021  ? Procedure: MRI WITH BRAIN WITH AND WITHOUT,CERVICAL WITH AND WITHOUT CONTRAST;  Surgeon: Radiologist, Medication, MD;  Location: [facility];  Service: Radiology;  Laterality: N/A;  ? TENNIS ELBOW RELEASE/NIRSCHEL PROCEDURE Right 05/17/2017  ? Procedure: EXPLORATION AND EXPOSURE OF RADIAL NERVE RIGHT ARM;  Surgeon: Roseanne Kaufman, MD;  Location: [facility];  Service: Orthopedics;  Laterality: Right;  ? ?Social History  ? ?Socioeconomic History  ? Marital status: Married  ?  Spouse name: Bernadetta Roell  ? Number of children: 3  ? Years of education: Not on file  ? Highest education level: Associate degree: occupational, Hotel manager, or vocational program  ?Occupational History  ? 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 Smoking status: Never  ? Smokeless tobacco: Never  ?Vaping Use  ? Vaping Use: Never used  ?Substance and Sexual Activity  ? Alcohol use: No  ?  Alcohol/week: 0.0 standard drinks  ? Drug use: No  ? Sexual activity: Yes  ?  Partners: Male  ?Other Topics Concern  ? Not on file  ?Social History Narrative  ? Not on file  ? ?  Social Determinants of Health  ? ?Financial Resource Strain: Not on file  ?Food Insecurity: Not on file  ?Transportation Needs: Not on file  ?Physical Activity: Not on file  ?Stress: Not on file  ?Social Connections: Not on file  ? ?Allergies  ?Allergen Reactions  ? Ampyra [Dalfampridine] Anaphylaxis and Shortness Of Breath  ?  Chest pain, also   ? Imitrex [Sumatriptan] Other (See Comments)  ?  Chest tightness  ? Crestor [Rosuvastatin Calcium] Other (See Comments)  ?  Abdominal pain, shortness of breath  ? Peanut-Containing Drug Products Itching  ?  Itching in the mouth  (remarked that she still eats this in limited amounts)  ? Shrimp [Shellfish Allergy] Itching  ?  Itching in the mouth (remarked that she still eats this in limited amounts)  ? ?Family History  ?Adopted: Yes  ? ? ?Current Outpatient Medications (Endocrine & Metabolic):  ?  predniSONE (DELTASONE) 20 MG tablet, Take 1 tablet (20 mg total) by mouth 2 (two) times daily. ? ?Current Outpatient Medications (Cardiovascular):  ?  bisoprolol (ZEBETA) 5 MG tablet, Take 0.5-1 tablets (2.5-5 mg total) by mouth daily for elevated blood pressure ?  candesartan (ATACAND) 8 MG tablet, Take 1 tablet (8 mg total) by mouth daily. (Patient taking differently: Take 4 mg by mouth daily as needed (Blood pressure).) ?  spironolactone (ALDACTONE) 25 MG tablet, Take 1 tablet (25 mg total) by mouth daily. ?  hydrochlorothiazide (MICROZIDE) 12.5 MG capsule, Take 1-2 capsules (12.5-25 mg total) by mouth daily for blood pressure (Patient not taking: Reported on 12/28/2021) ?  pravastatin (PRAVACHOL) 20 MG tablet, TAKE 1 TABLET BY MOUTH DAILY (Patient taking differently: Take 20 mg by mouth at bedtime.) ? ?Current Outpatient Medications (Respiratory):  ?  albuterol (PROVENTIL) (2.5 MG/3ML) 0.083% nebulizer solution, USE 1 VIAL VIA NEBULIZER EVERY 4 TO 6 HOURS AS NEEDED FOR SHORTNESS OF Nicholes Mango Ellie Lunch ? ?Current Outpatient Medications (Analgesics):  ?  Galcanezumab-gnlm (EMGALITY) 120 MG/ML SOAJ, Inject 120 mg into the skin every 30 (thirty) days. ?  naratriptan (AMERGE) 2.5 MG tablet, Take 1 tablet (2.5 mg total) by mouth as directed for Migraine. May take a second dose after 4 hours if needed. ? ? ?Current Outpatient Medications (Other):  ?  amphetamine-dextroamphetamine (ADDERALL) 10 MG tablet, Take 1 tablet (10 mg total) by mouth 2 (two) times daily. (Patient taking differently: Take 10 mg by mouth daily as needed (MS).) ?  buPROPion (WELLBUTRIN) 75 MG tablet, Take 1 tablet (75 mg total) by mouth once daily ?  Cholecalciferol (VITAMIN D) 50 MCG  (2000 UT) CAPS, Take 5,000 Units by mouth every 30 (thirty) days. ?  Cladribine, 8 Tabs, (MAVENCLAD, 8 TABS,) 10 MG TBPK, Take by mouth See admin instructions. Take once a year will start in July  Month one: Day 1-3: 2 tabs/day, Day 4 and 5: 1 tab/day Month two: Day 1 and 2: 2 tabs/day, Days 3-5: 1 tab/day ?  ergocalciferol (VITAMIN D2) 1.25 MG (50000 UT) capsule, Take 1 capsule (50,000 Units total) by mouth once a week. ?  gabapentin (NEURONTIN) 400 MG capsule, Take 1 capsule (400 mg total) by mouth at bedtime. ?  omeprazole (PRILOSEC) 20 MG capsule, Take 1 capsule (20 mg total) by mouth daily 30 minutes before morning meal ?  zolpidem (AMBIEN CR) 6.25 MG CR tablet, Take 1 tablet (6.25 mg total) by mouth at bedtime. ?  potassium chloride SA (KLOR-CON M20) 20 MEQ tablet, Take 1 tablet (20 mEq total) by mouth 2 (two) times daily  with food for 7 days for hypokalemia, THEN 1 tablet (20 mEq total) daily. (Patient taking differently: Take 10 meq daily) ?  zonisamide (ZONEGRAN) 100 MG capsule, TAKE 2 CAPSULES BY MOUTH AT BEDTIME AFTER USING '25MG'$  CAPS TO INCREASE GRADUALLY (Patient taking differently: 100 mg at bedtime.) ? ? ?Reviewed prior external information including notes and imaging from  ?primary care provider ?As well as notes that were available from care everywhere and other healthcare systems. ? ?Past medical history, social, surgical and family history all reviewed in electronic medical record.  No pertanent information unless stated regarding to the chief complaint.  ? ?Review of Systems: ? No headache, visual changes, nausea, vomiting, diarrhea, constipation, dizziness, abdominal pain, skin rash, fevers, chills, night sweats, weight loss, swollen lymph nodes, body aches, joint swelling, chest pain, shortness of breath, mood changes. POSITIVE muscle aches ? ?Objective  ?Blood pressure 122/80, pulse 65, height '5\' 7"'$  (1.702 m), weight 166 lb 6.4 oz (75.5 kg), SpO2 99 %. ?  ?General: No apparent distress alert  and oriented x3 mood and affect normal, dressed appropriately.  ?HEENT: Pupils equal, extraocular movements intact  ?Respiratory: Patient's speak in full sentences and does not appear short of breath  ?Cardiovascular: N

## 2021-12-28 ENCOUNTER — Other Ambulatory Visit (HOSPITAL_COMMUNITY): Payer: Self-pay

## 2021-12-28 ENCOUNTER — Ambulatory Visit: Payer: PPO | Admitting: Family Medicine

## 2021-12-28 VITALS — BP 122/80 | HR 65 | Ht 67.0 in | Wt 166.4 lb

## 2021-12-28 DIAGNOSIS — G35 Multiple sclerosis: Secondary | ICD-10-CM

## 2021-12-28 DIAGNOSIS — M25552 Pain in left hip: Secondary | ICD-10-CM

## 2021-12-28 DIAGNOSIS — M533 Sacrococcygeal disorders, not elsewhere classified: Secondary | ICD-10-CM | POA: Diagnosis not present

## 2021-12-28 MED ORDER — KETOROLAC TROMETHAMINE 60 MG/2ML IM SOLN
60.0000 mg | Freq: Once | INTRAMUSCULAR | Status: AC
  Administered 2021-12-28: 60 mg via INTRAMUSCULAR

## 2021-12-28 MED ORDER — METHYLPREDNISOLONE ACETATE 80 MG/ML IJ SUSP
80.0000 mg | Freq: Once | INTRAMUSCULAR | Status: AC
  Administered 2021-12-28: 80 mg via INTRAMUSCULAR

## 2021-12-28 MED ORDER — PREDNISONE 20 MG PO TABS
20.0000 mg | ORAL_TABLET | Freq: Two times a day (BID) | ORAL | 0 refills | Status: DC
  Filled 2021-12-28: qty 14, 7d supply, fill #0

## 2021-12-28 NOTE — Patient Instructions (Addendum)
Good to see you ? ?You received an injection today. Seek immediate medical attention if the joint becomes red, extremely painful, or is oozing fluid.  ? ?I've sent a prescription for prednisone to your pharmacy.  ? ?Do not take any other anti-inflammatories. ? ?Send [place] message in 1 week and let us know how you are feeling ? ?See me again in 6-8 weeks. ? ?

## 2021-12-28 NOTE — Assessment & Plan Note (Signed)
Difficult to say with this in the left lower extremity if this is more secondary to a lumbar radiculopathy or the possibility of a multiple sclerosis flare.  We discussed certain things such as laboratory work-up and a sedimentation rate, versus possible advanced imaging of the lumbar spine which is also within the significant differential.  Patient states at this point patient would like to have treatment for more the inflammation and was given Toradol and Depo-Medrol today.  We discussed home exercises and icing regimen, discussed which activities to do which ones to avoid.  Follow-up with me again in4 weeks. ?

## 2021-12-30 ENCOUNTER — Other Ambulatory Visit (HOSPITAL_COMMUNITY): Payer: Self-pay

## 2021-12-30 MED ORDER — POTASSIUM CHLORIDE ER 10 MEQ PO CPCR
10.0000 meq | ORAL_CAPSULE | Freq: Every day | ORAL | 1 refills | Status: DC
  Filled 2021-12-30 – 2022-01-11 (×2): qty 30, 30d supply, fill #0
  Filled 2022-03-14: qty 30, 30d supply, fill #1

## 2021-12-30 MED ORDER — CANDESARTAN CILEXETIL 8 MG PO TABS
8.0000 mg | ORAL_TABLET | Freq: Every day | ORAL | 1 refills | Status: DC
  Filled 2021-12-30: qty 90, 90d supply, fill #0

## 2022-01-07 ENCOUNTER — Other Ambulatory Visit (HOSPITAL_COMMUNITY): Payer: Self-pay

## 2022-01-11 ENCOUNTER — Other Ambulatory Visit (HOSPITAL_COMMUNITY): Payer: Self-pay

## 2022-01-11 MED ORDER — PRAVASTATIN SODIUM 20 MG PO TABS
20.0000 mg | ORAL_TABLET | Freq: Every day | ORAL | 1 refills | Status: DC
  Filled 2022-01-11: qty 90, 90d supply, fill #0
  Filled 2022-04-10: qty 90, 90d supply, fill #1

## 2022-01-18 ENCOUNTER — Telehealth: Payer: Self-pay | Admitting: *Deleted

## 2022-01-18 NOTE — Telephone Encounter (Signed)
Received fax from [employer] that pt due for 2nd yr [employer] soon. Spoke w/ Dr. Felecia Shelling.  ?She has first year 02/2021 (month 1) and 04/12/21-04/16/21 (month 2). He would like her to come in end of May, early June for f/u instead of her July appt. Asked her to call office back to schedule f/u.  ? ?If she calls, please offer 02/10/22 at 2p with Dr. Felecia Shelling ?

## 2022-01-22 ENCOUNTER — Other Ambulatory Visit (HOSPITAL_COMMUNITY): Payer: Self-pay

## 2022-01-22 MED ORDER — ZONISAMIDE 100 MG PO CAPS
100.0000 mg | ORAL_CAPSULE | Freq: Every day | ORAL | 1 refills | Status: DC
  Filled 2022-01-22 (×2): qty 90, 90d supply, fill #0
  Filled 2022-04-22: qty 90, 90d supply, fill #1

## 2022-01-25 ENCOUNTER — Other Ambulatory Visit (HOSPITAL_COMMUNITY): Payer: Self-pay

## 2022-02-08 ENCOUNTER — Ambulatory Visit: Payer: PPO | Admitting: Family Medicine

## 2022-02-12 ENCOUNTER — Other Ambulatory Visit (HOSPITAL_COMMUNITY): Payer: Self-pay

## 2022-02-12 MED ORDER — ZOLPIDEM TARTRATE ER 6.25 MG PO TBCR
6.2500 mg | EXTENDED_RELEASE_TABLET | Freq: Every day | ORAL | 3 refills | Status: DC
  Filled 2022-02-12: qty 30, 30d supply, fill #0
  Filled 2022-03-14: qty 30, 30d supply, fill #1
  Filled 2022-04-10: qty 30, 30d supply, fill #2
  Filled 2022-05-09: qty 30, 30d supply, fill #3

## 2022-02-17 ENCOUNTER — Ambulatory Visit: Payer: PPO | Admitting: Neurology

## 2022-02-17 ENCOUNTER — Other Ambulatory Visit (HOSPITAL_COMMUNITY): Payer: Self-pay

## 2022-02-17 ENCOUNTER — Encounter: Payer: Self-pay | Admitting: Neurology

## 2022-02-17 VITALS — BP 135/78 | HR 73 | Ht 67.0 in | Wt 159.0 lb

## 2022-02-17 DIAGNOSIS — R3915 Urgency of urination: Secondary | ICD-10-CM

## 2022-02-17 DIAGNOSIS — G35 Multiple sclerosis: Secondary | ICD-10-CM

## 2022-02-17 DIAGNOSIS — Z79899 Other long term (current) drug therapy: Secondary | ICD-10-CM | POA: Diagnosis not present

## 2022-02-17 DIAGNOSIS — R269 Unspecified abnormalities of gait and mobility: Secondary | ICD-10-CM

## 2022-02-17 DIAGNOSIS — F40240 Claustrophobia: Secondary | ICD-10-CM

## 2022-02-17 MED ORDER — GABAPENTIN 600 MG PO TABS
600.0000 mg | ORAL_TABLET | Freq: Every evening | ORAL | 3 refills | Status: DC
Start: 2022-02-17 — End: 2022-11-29
  Filled 2022-02-17: qty 90, 90d supply, fill #0
  Filled 2022-06-10: qty 90, 90d supply, fill #1

## 2022-02-17 MED ORDER — AMPHETAMINE-DEXTROAMPHETAMINE 10 MG PO TABS
10.0000 mg | ORAL_TABLET | Freq: Two times a day (BID) | ORAL | 0 refills | Status: DC
  Filled 2022-02-17: qty 60, 30d supply, fill #0

## 2022-02-17 NOTE — Progress Notes (Signed)
[facility]  PATIENT: Cynthia Nichols DOB: 02/11/1961  REFERRING DOCTOR OR PCP: Darlin Priestly, MD ([place]); Clydie Braun, FNP (PCP) SOURCE: Patient, notes from neurology, imaging and lab reports.  _________________________________   HISTORICAL  CHIEF COMPLAINT:  Chief Complaint  Patient presents with   Follow-up    Pt alone, rm 1. Pt presents today for MS follow up. She is going on 2nd year of mavenclad.     HISTORY OF PRESENT ILLNESS:  Cynthia Nichols is a 61 y.o. woman with multiple sclerosis.  She had no other exacerbations.     Update 10/07/2021: [name] was initiated late June 2022 and second month July 24 - 04/16/21.    She felt very tired and achy the second week but was fine a few days later.      Lymphocytes were 0.6 in 05/2021 and 0.8 10/07/2020.   She never got the MRI last year we ordered (very claustophobic)  She feels her gait and strength are about the same.  Some stumbles but only rare fall   Right leg is weaker than left.  She uses her right AFO and a cane. She can walk nearly a mile without a break but the weakness worsens.     Around the house she often does not need cane.   She has some spasticity in her right leg.   She has pain in her back, hips and legs, probably worse on the right. She has numbness and tingling in her feet.    She has urge incontinence / nocturia.   Oxybutynin was poorly tolerated.   She would prefer not to take another pill (we had discussed Myrbetriq).   She has 2x nocturia.    She has fatigue helped a bit by Adderall.  She sleeps poorly.  Ambien CR 6.25 mg helps her fall asleep but she has some sleep maintenance issues.She has leg cramps and RLS type symptoms at night,     Hip pain is worse at night.   She has anxiety > depression.   She notes has cognitive issue.  Word finding is difficult at times and mild difficulties with STM.   She has trouble with parallel processing.     She has LBP, worse with prolonged  standing.    MRI lumbar in 2018 was normal by report.   She broke her right humerus in a fall in 2018.   Also has had right ankle fracture.    She has migraine headaches well controlled on zonisamide and Emgality,   She has non-migraine headaches more than migraine  MS History:  She had the onset of right leg weakness one day in 2011.  Symptoms did not improve.  However, she began to have bladder issues a few years earlier.    She initially was seen at [facility].  MRI of the cervical spine 11/12/2009 showed only the one spot at [facility].   She had EP studies (normal) and CSF analysis which was reporgtedly c/w MS.   She was placed on Copaxone.  She had episodes of diplopia which were fleeting.   She was switched to Tecfidera bit had trouble tolerating it.   After several years, she switched to Tysabri.   She felt great after the first dose but felt poorly after the second dose so she stopped.   About 2 years ago, she started Mayzent but stopped after 3 months due to feeling poorly.   Though no relapses after she stopped, gait has slowly worsened.  Mavenclad was initiated in June 022.      Imaging reports (actual images have been requested but were not available at the time of the visit): MRI of the brain report 06/14/2019 "No significant change nonenhancing white matter foci compatible with multiple sclerosis. No new lesion"  MRI of the cervical spine 06/14/2019 report "T2 signal: Dorsal lateral right at C1 stable. Right ventrolateral. C4 Which may or may not have been present previously. Bilateral ventral cord left greater than right as seen previously adjacent to extrusion as per below, question right progression versus artifact... Right lateral C6 stable.. Large longitudinal focus left  T2 stable on sagittal images.    Degenerative change with a large disc osteophyte complex at C5-C6 to the right with moderate bilateral foraminal narrowing and moderate spinal stenosis.  milder problems at other levels.   REVIEW  OF SYSTEMS: Constitutional: No fevers, chills, sweats, or change in appetite Eyes: No visual changes, double vision, eye pain Ear, nose and throat: No hearing loss, ear pain, nasal congestion, sore throat Cardiovascular: No chest pain, palpitations Respiratory:  No shortness of breath at rest or with exertion.   No wheezes GastrointestinaI: No nausea, vomiting, diarrhea, abdominal pain, fecal incontinence Genitourinary:  No dysuria, urinary retention or frequency.  No nocturia. Musculoskeletal:  No neck pain, back pain Integumentary: No rash, pruritus, skin lesions Neurological: as above Psychiatric: No depression at this time.  No anxiety Endocrine: No palpitations, diaphoresis, change in appetite, change in weigh or increased thirst Hematologic/Lymphatic:  No anemia, purpura, petechiae. Allergic/Immunologic: No itchy/runny eyes, nasal congestion, recent allergic reactions, rashes  ALLERGIES: Allergies  Allergen Reactions   Ampyra [Dalfampridine] Anaphylaxis and Shortness Of Breath    Chest pain, also    Imitrex [Sumatriptan] Other (See Comments)    Chest tightness   Crestor [Rosuvastatin Calcium] Other (See Comments)    Abdominal pain, shortness of breath   Peanut-Containing Drug Products Itching    Itching in the mouth (remarked that she still eats this in limited amounts)   Shrimp [Shellfish Allergy] Itching    Itching in the mouth (remarked that she still eats this in limited amounts)    HOME MEDICATIONS:  Current Outpatient Medications:    bisoprolol (ZEBETA) 5 MG tablet, Take 0.5-1 tablets (2.5-5 mg total) by mouth daily for elevated blood pressure, Disp: 30 tablet, Rfl: 2   buPROPion (WELLBUTRIN) 75 MG tablet, Take 1 tablet (75 mg total) by mouth once daily, Disp: 30 tablet, Rfl: 11   candesartan (ATACAND) 8 MG tablet, Take 1 tablet (8 mg total) by mouth daily. (Patient taking differently: Take 4 mg by mouth daily as needed (Blood pressure).), Disp: 90 tablet, Rfl: 1    Cholecalciferol (VITAMIN D) 50 MCG (2000 UT) CAPS, Take 5,000 Units by mouth every 30 (thirty) days., Disp: , Rfl:    Cladribine, 8 Tabs, (MAVENCLAD, 8 TABS,) 10 MG TBPK, Take by mouth See admin instructions. Take once a year will start in July  Month one: Day 1-3: 2 tabs/day, Day 4 and 5: 1 tab/day Month two: Day 1 and 2: 2 tabs/day, Days 3-5: 1 tab/day, Disp: , Rfl:    ergocalciferol (VITAMIN D2) 1.25 MG (50000 UT) capsule, Take 1 capsule (50,000 Units total) by mouth once a week., Disp: 24 capsule, Rfl: 0   gabapentin (NEURONTIN) 600 MG tablet, Take 1 tablet (600 mg total) by mouth at bedtime., Disp: 90 tablet, Rfl: 3   hydrochlorothiazide (MICROZIDE) 12.5 MG capsule, Take 1-2 capsules (12.5-25 mg total) by mouth daily for  blood pressure, Disp: 60 capsule, Rfl: 3   naratriptan (AMERGE) 2.5 MG tablet, Take 1 tablet (2.5 mg total) by mouth as directed for Migraine. May take a second dose after 4 hours if needed., Disp: 10 tablet, Rfl: 11   omeprazole (PRILOSEC) 20 MG capsule, Take 1 capsule (20 mg total) by mouth daily 30 minutes before morning meal, Disp: 30 capsule, Rfl: 1   potassium chloride (MICRO-K) 10 MEQ CR capsule, Take 1 capsule (10 mEq total) by mouth daily., Disp: 30 capsule, Rfl: 1   pravastatin (PRAVACHOL) 20 MG tablet, Take 1 tablet (20 mg total) by mouth daily., Disp: 90 tablet, Rfl: 1   zolpidem (AMBIEN CR) 6.25 MG CR tablet, Take 1 tablet by mouth at bedtime, Disp: 30 tablet, Rfl: 3   zonisamide (ZONEGRAN) 100 MG capsule, Take 1 capsule (100 mg total) by mouth at bedtime., Disp: 90 capsule, Rfl: 1   albuterol (PROVENTIL) (2.5 MG/3ML) 0.083% nebulizer solution, USE 1 VIAL VIA NEBULIZER EVERY 4 TO 6 HOURS AS NEEDED FOR SHORTNESS OF BREATH /WHEEZING, Disp: 225 mL, Rfl: 0   amphetamine-dextroamphetamine (ADDERALL) 10 MG tablet, Take 1 tablet (10 mg total) by mouth 2 (two) times daily., Disp: 60 tablet, Rfl: 0   potassium chloride SA (KLOR-CON M20) 20 MEQ tablet, Take 1 tablet (20 mEq  total) by mouth 2 (two) times daily with food for 7 days for hypokalemia, THEN 1 tablet (20 mEq total) daily. 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   Aortic atherosclerosis ([place]) 11/29/2017   Asthma    Asthma 11/02/2019   Closed fracture of shaft of right humerus 05/13/2017   Closed low lateral malleolus fracture, left, initial encounter 10/17/2018   COVID 07/2021   mild case   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   Essential hypertension, benign 11/29/2017   Fracture of fifth metatarsal bone 08/13/2015   Greater trochanteric bursitis, left 12/31/2019   Injection given December 31, 2019   Headache(784.0)    Humerus fracture    right with radial nerve palsy   Hypertension    IBS (irritable bowel syndrome)    Migraine without aura and without status migrainosus, not intractable 02/09/2018   MS (multiple sclerosis) ([place])    Multiple sclerosis, relapsing-remitting ([place]) 12/14/2011   Osteoporosis 11/29/2017   Other fracture of shaft of right humerus, initial encounter for closed fracture 05/13/2017   Patella fracture 07/03/2015   Hairline oblique     Patellar subluxation, left, initial encounter 08/14/2018   PONV (postoperative nausea and vomiting)    only on this surgery   Radial nerve palsy, 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   Right-sided    Unspecified venous (peripheral) insufficiency 11/09/2011   Venous aneurysm    Right side of neck    PAST SURGICAL HISTORY: Past Surgical History:  Procedure Laterality Date   ABDOMINAL HYSTERECTOMY     BREAST MASS EXCISION     Bilateral breast mass excision (  Benign)   ORIF HUMERUS FRACTURE Right 05/17/2017   RADIOLOGY WITH ANESTHESIA N/A 11/03/2021   Procedure: MRI WITH BRAIN WITH AND WITHOUT,CERVICAL WITH AND WITHOUT CONTRAST;  Surgeon:  Radiologist, Medication, MD;  Location: [place];  Service: Radiology;  Laterality: N/A;   TENNIS ELBOW RELEASE/NIRSCHEL PROCEDURE Right 05/17/2017   Procedure: EXPLORATION AND EXPOSURE OF RADIAL NERVE RIGHT ARM;  Surgeon: Roseanne Kaufman, MD;  Location: [place];  Service: Orthopedics;  Laterality: Right;    FAMILY HISTORY: Family History  Adopted: Yes    SOCIAL HISTORY:  Social History   Socioeconomic History   Marital status: Married    Spouse name: Karesha Trzcinski   Number of children: 3   Years of education: Not on file   Highest education level: Associate degree: occupational, Hotel manager, or vocational program  Occupational History   Occupation: disabled    Comment: Therapist, sports  Tobacco Use   Smoking status: Never   Smokeless tobacco: Never  Vaping Use   Vaping Use: Never used  Substance and Sexual Activity   Alcohol use: No    Alcohol/week: 0.0 standard drinks   Drug use: No   Sexual activity: Yes    Partners: Male  Other Topics Concern   Not on file  Social History Narrative   Not on file   Social Determinants of Health   Financial Resource Strain: Not on file  Food Insecurity: Not on file  Transportation Needs: Not on file  Physical Activity: Not on file  Stress: Not on file  Social Connections: Not on file  Intimate Partner Violence: Not on file     PHYSICAL EXAM  Vitals:   02/17/22 1351  BP: 135/78  Pulse: 73  Weight: 159 lb (72.1 kg)  Height: '5\' 7"'$  (1.702 m)    Body mass index is 24.9 kg/m.   General: The patient is well-developed and well-nourished and in no acute distress  HEENT:  Head is [place]/AT.  Sclera are anicteric.    Neck: the neck is nontender.  Skin: Extremities are without rash or  edema.  Musculoskeletal:  Back is nontender  Neurologic Exam  Mental status: The  patient is alert and oriented x 3 at the time of the examination. The patient has apparent normal recent and remote memory, with an apparently normal attention span and concentration ability.   Speech is normal.  Cranial nerves: Extraocular movements are full.    There is good facial sensation to soft touch bilaterally.Facial strength is normal.  No dysarthria is noted.   e. No obvious hearing deficits are noted.  Motor:  Muscle bulk is normal.   Tone is increased in the right leg.   Strength is 3/5 right hip flexors, 4+/5 quads, 4/5 hamstrings and 2+/5 toe/ankle extension.   Strength is  5 / 5 elsewhere.   Sensory: Sensory testing is intact to pinprick, soft touch and vibration sensation in all 4 extremities.  Coordination: Cerebellar testing reveals good finger-nose-finger and reduced heel to shin worse on right  Gait and station: Station is normal.   She has a wide based gait with a right foot drop.  Unable to tandem walk. Romberg is negative.   Reflexes: Deep tendon reflexes are symmetric and normal in arms, slightly increased right leg.        DIAGNOSTIC DATA (LABS, IMAGING, TESTING) - I reviewed patient records, labs, notes, testing and imaging myself where available.  Lab Results  Component Value Date   WBC 6.4 10/07/2021   HGB 14.0 10/07/2021   HCT 40.3 10/07/2021   MCV 90 10/07/2021   PLT 235 10/07/2021      Component Value Date/Time   NA 142 02/17/2021 1433   K 3.8  02/17/2021 1433   CL 102 02/17/2021 1433   CO2 23 02/17/2021 1433   GLUCOSE 106 (H) 02/17/2021 1433   GLUCOSE 120 (H) 07/11/2020 1708   BUN 16 02/17/2021 1433   CREATININE 0.85 02/17/2021 1433   CREATININE 0.59 02/09/2018 0930   CALCIUM 10.6 (H) 02/17/2021 1433   PROT 7.6 10/07/2021 1401   ALBUMIN 4.7 10/07/2021 1401   AST 18 10/07/2021 1401   ALT 29 10/07/2021 1401   ALKPHOS 115 10/07/2021 1401   BILITOT 0.8 10/07/2021 1401   GFRNONAA >60 07/11/2020 1708   GFRNONAA 84 12/14/2017 0945   GFRAA 98  12/14/2017 0945    Lab Results  Component Value Date   TSH 1.07 03/09/2017       ASSESSMENT AND PLAN  Multiple sclerosis, relapsing-remitting (HCC) - Plan: Hepatitis B surface antigen, HIV Antibody (routine testing w rflx), QuantiFERON-TB Gold Plus, Hepatitis B surface antibody,qualitative, Hepatitis B core antibody, total, CBC with Differential/Platelet, Comprehensive metabolic panel, MR BRAIN W WO CONTRAST, MR CERVICAL SPINE W WO CONTRAST  High risk medication use - Plan: Hepatitis B surface antigen, HIV Antibody (routine testing w rflx), QuantiFERON-TB Gold Plus, Hepatitis B surface antibody,qualitative, Hepatitis B core antibody, total, CBC with Differential/Platelet, Comprehensive metabolic panel  Gait disturbance - Plan: MR BRAIN W WO CONTRAST, MR CERVICAL SPINE W WO CONTRAST  Urinary urgency  Claustrophobia - Plan: MR BRAIN W WO CONTRAST, MR CERVICAL SPINE W WO CONTRAST   Labs for year 2 of Cladribine  MRI of the brian and cervical spine to determine if subclinical progression.  If occurring, we will need to consider a different DMT.   Needs conscious sedation due to extreme claustophobia.   We discussedactive SPMS and expectations of medicaitons (prevent new lesions).  Unfortunately medications are not too effective against the slow progression. Stay active and exercise as tolerated. Rtc 4 months or sooner if new or worsening issues    Undra Harriman A. Felecia Shelling, MD, [facility] 2/94/7654, 6:50 PM Certified in Neurology, Clinical Neurophysiology, Sleep Medicine and Neuroimaging  [facility] [address] [place], [place] [address] [phone]

## 2022-02-18 ENCOUNTER — Encounter: Payer: Self-pay | Admitting: Neurology

## 2022-02-18 ENCOUNTER — Other Ambulatory Visit (HOSPITAL_COMMUNITY): Payer: Self-pay

## 2022-02-18 NOTE — Telephone Encounter (Signed)
What is the patient's sedation requirement? General Anesthesia (available ONLY at [facility])  This is on all the orders it will need to be changed

## 2022-02-19 NOTE — Progress Notes (Deleted)
[name] Phone: [phone] Subjective:    I'm seeing this patient by the request  of:  Leilani Able, FNP  CC:   [id number]  12/28/2021 Difficult to say with this in the left lower extremity if this is more secondary to a lumbar radiculopathy or the possibility of a multiple sclerosis flare.  We discussed certain things such as laboratory work-up and a sedimentation rate, versus possible advanced imaging of the lumbar spine which is also within the significant differential.  Patient states at this point patient would like to have treatment for more the inflammation and was given Toradol and Depo-Medrol today.  We discussed home exercises and icing regimen, discussed which activities to do which ones to avoid.  Follow-up with me again in4 weeks.  Update 02/22/2022 Cynthia Nichols is a 61 y.o. female coming in with complaint of L hip and SI joint pain. 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   Essential hypertension, benign 11/29/2017   Fracture of fifth metatarsal bone 08/13/2015   Greater trochanteric bursitis, left 12/31/2019   Injection given December 31, 2019   Headache(784.0)    Humerus fracture    right with radial nerve palsy   Hypertension    IBS (irritable bowel syndrome)    Migraine without aura and without status migrainosus, not intractable 02/09/2018   MS (multiple sclerosis) ([place])    Multiple sclerosis, relapsing-remitting ([place]) 12/14/2011   Osteoporosis 11/29/2017   Other fracture of shaft of right  humerus, initial encounter for closed fracture 05/13/2017   Patella fracture 07/03/2015   Hairline oblique     Patellar subluxation, left, initial encounter 08/14/2018   PONV (postoperative nausea and vomiting)    only on this surgery   Radial nerve palsy, 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   Right-sided    Unspecified venous (peripheral) insufficiency 11/09/2011   Venous aneurysm    Right side of neck   Past Surgical History:  Procedure Laterality Date   ABDOMINAL HYSTERECTOMY     BREAST MASS EXCISION     Bilateral breast mass excision ( Benign)   ORIF HUMERUS FRACTURE Right 05/17/2017   RADIOLOGY WITH ANESTHESIA N/A 11/03/2021   Procedure: MRI WITH BRAIN WITH AND WITHOUT,CERVICAL WITH AND WITHOUT CONTRAST;  Surgeon: Radiologist, Medication, MD;  Location: [place];  Service: Radiology;  Laterality: N/A;   TENNIS ELBOW RELEASE/NIRSCHEL PROCEDURE Right 05/17/2017   Procedure: EXPLORATION AND EXPOSURE OF RADIAL NERVE RIGHT ARM;  Surgeon: Roseanne Kaufman, MD;  Location: [place];  Service: Orthopedics;  Laterality: Right;   Social History   Socioeconomic History   Marital status: Married    Spouse name: Lashundra Shiveley   Number of children: 3   Years of education: Not on file   Highest education level: Associate degree: occupational, Hotel manager, or vocational program  Occupational History   Occupation: disabled    Comment: RN  Tobacco Use   Smoking status: Never   Smokeless tobacco: Never  Vaping Use  Vaping Use: Never used  Substance and Sexual Activity   Alcohol use: No    Alcohol/week: 0.0 standard drinks   Drug use: No   Sexual activity: Yes    Partners: Male  Other Topics Concern   Not on file  Social History Narrative   Not on file   Social Determinants of Health   Financial Resource Strain: Not on file  Food Insecurity: Not on file  Transportation Needs: Not on file  Physical Activity: Not on file  Stress: Not on file  Social Connections:  Not on file   Allergies  Allergen Reactions   Ampyra [Dalfampridine] Anaphylaxis and Shortness Of Breath    Chest pain, also    Imitrex [Sumatriptan] Other (See Comments)    Chest tightness   Crestor [Rosuvastatin Calcium] Other (See Comments)    Abdominal pain, shortness of breath   Peanut-Containing Drug Products Itching    Itching in the mouth (remarked that she still eats this in limited amounts)   Shrimp [Shellfish Allergy] Itching    Itching in the mouth (remarked that she still eats this in limited amounts)   Family History  Adopted: Yes     Current Outpatient Medications (Cardiovascular):    bisoprolol (ZEBETA) 5 MG tablet, Take 0.5-1 tablets (2.5-5 mg total) by mouth daily for elevated blood pressure   candesartan (ATACAND) 8 MG tablet, Take 1 tablet (8 mg total) by mouth daily. (Patient taking differently: Take 4 mg by mouth daily as needed (Blood pressure).)   hydrochlorothiazide (MICROZIDE) 12.5 MG capsule, Take 1-2 capsules (12.5-25 mg total) by mouth daily for blood pressure   pravastatin (PRAVACHOL) 20 MG tablet, TAKE 1 TABLET BY MOUTH DAILY (Patient taking differently: Take 20 mg by mouth at bedtime.)   pravastatin (PRAVACHOL) 20 MG tablet, Take 1 tablet (20 mg total) by mouth daily.  Current Outpatient Medications (Respiratory):    albuterol (PROVENTIL) (2.5 MG/3ML) 0.083% nebulizer solution, USE 1 VIAL VIA NEBULIZER EVERY 4 TO 6 HOURS AS NEEDED FOR SHORTNESS OF BREATH Ellie Lunch  Current Outpatient Medications (Analgesics):    naratriptan (AMERGE) 2.5 MG tablet, Take 1 tablet (2.5 mg total) by mouth as directed for Migraine. May take a second dose after 4 hours if needed.   Current Outpatient Medications (Other):    amphetamine-dextroamphetamine (ADDERALL) 10 MG tablet, Take 1 tablet (10 mg total) by mouth 2 (two) times daily.   buPROPion (WELLBUTRIN) 75 MG tablet, Take 1 tablet (75 mg total) by mouth once daily   Cholecalciferol (VITAMIN D) 50 MCG (2000 UT)  CAPS, Take 5,000 Units by mouth every 30 (thirty) days.   Cladribine, 8 Tabs, (MAVENCLAD, 8 TABS,) 10 MG TBPK, Take by mouth See admin instructions. Take once a year will start in July  Month one: Day 1-3: 2 tabs/day, Day 4 and 5: 1 tab/day Month two: Day 1 and 2: 2 tabs/day, Days 3-5: 1 tab/day   ergocalciferol (VITAMIN D2) 1.25 MG (50000 UT) capsule, Take 1 capsule (50,000 Units total) by mouth once a week.   gabapentin (NEURONTIN) 600 MG tablet, Take 1 tablet (600 mg total) by mouth at bedtime.   omeprazole (PRILOSEC) 20 MG capsule, Take 1 capsule (20 mg total) by mouth daily 30 minutes before morning meal   potassium chloride (MICRO-K) 10 MEQ CR capsule, Take 1 capsule (10 mEq total) by mouth daily.   potassium chloride SA (KLOR-CON M20) 20 MEQ tablet, Take 1 tablet (20 mEq total) by mouth 2 (two) times daily with food for 7 days for  hypokalemia, THEN 1 tablet (20 mEq total) daily. (Patient taking differently: Take 10 meq daily)   zolpidem (AMBIEN CR) 6.25 MG CR tablet, Take 1 tablet by mouth at bedtime   zonisamide (ZONEGRAN) 100 MG capsule, Take 1 capsule (100 mg total) by mouth at bedtime.   Reviewed prior external information including notes and imaging from  primary care provider As well as notes that were available from care everywhere and other healthcare systems.  Past medical history, social, surgical and family history all reviewed in electronic medical record.  No pertanent information unless stated regarding to the chief complaint.   Review of Systems:  No headache, visual changes, nausea, vomiting, diarrhea, constipation, dizziness, abdominal pain, skin rash, fevers, chills, night sweats, weight loss, swollen lymph nodes, body aches, joint swelling, chest pain, shortness of breath, mood changes. POSITIVE muscle aches  Objective  There were no vitals taken for this visit.   General: No apparent distress alert and oriented x3 mood and affect normal, dressed appropriately.   HEENT: Pupils equal, extraocular movements intact  Respiratory: Patient's speak in full sentences and does not appear short of breath  Cardiovascular: No lower extremity edema, non tender, no erythema  Gait normal with good balance and coordination.  MSK:  Non tender with full range of motion and good stability and symmetric strength and tone of shoulders, elbows, wrist, hip, knee and ankles bilaterally.     Impression and Recommendations:     The above documentation has been reviewed and is accurate and complete Cynthia Nichols

## 2022-02-21 LAB — COMPREHENSIVE METABOLIC PANEL
ALT: 31 IU/L (ref 0–32)
AST: 22 IU/L (ref 0–40)
Albumin/Globulin Ratio: 1.6 (ref 1.2–2.2)
Albumin: 4.6 g/dL (ref 3.8–4.9)
Alkaline Phosphatase: 89 IU/L (ref 44–121)
BUN/Creatinine Ratio: 21 (ref 12–28)
BUN: 19 mg/dL (ref 8–27)
Bilirubin Total: 1.1 mg/dL (ref 0.0–1.2)
CO2: 26 mmol/L (ref 20–29)
Calcium: 9.9 mg/dL (ref 8.7–10.3)
Chloride: 105 mmol/L (ref 96–106)
Creatinine, Ser: 0.91 mg/dL (ref 0.57–1.00)
Globulin, Total: 2.8 g/dL (ref 1.5–4.5)
Glucose: 91 mg/dL (ref 70–99)
Potassium: 3.4 mmol/L — ABNORMAL LOW (ref 3.5–5.2)
Sodium: 144 mmol/L (ref 134–144)
Total Protein: 7.4 g/dL (ref 6.0–8.5)
eGFR: 72 mL/min/{1.73_m2} (ref >59)

## 2022-02-21 LAB — CBC WITH DIFFERENTIAL/PLATELET
Basophils Absolute: 0 10*3/uL (ref 0.0–0.2)
Basos: 1 %
EOS (ABSOLUTE): 0.1 10*3/uL (ref 0.0–0.4)
Eos: 1 %
Hematocrit: 40.7 % (ref 34.0–46.6)
Hemoglobin: 14 g/dL (ref 11.1–15.9)
Immature Grans (Abs): 0 10*3/uL (ref 0.0–0.1)
Immature Granulocytes: 0 %
Lymphocytes Absolute: 0.9 10*3/uL (ref 0.7–3.1)
Lymphs: 21 %
MCH: 32 pg (ref 26.6–33.0)
MCHC: 34.4 g/dL (ref 31.5–35.7)
MCV: 93 fL (ref 79–97)
Monocytes Absolute: 0.3 10*3/uL (ref 0.1–0.9)
Monocytes: 7 %
Neutrophils Absolute: 3 10*3/uL (ref 1.4–7.0)
Neutrophils: 70 %
Platelets: 226 10*3/uL (ref 150–450)
RBC: 4.38 x10E6/uL (ref 3.77–5.28)
RDW: 12.5 % (ref 11.7–15.4)
WBC: 4.4 10*3/uL (ref 3.4–10.8)

## 2022-02-21 LAB — QUANTIFERON-TB GOLD PLUS
QuantiFERON Mitogen Value: 2.73 IU/mL
QuantiFERON Nil Value: 0.45 IU/mL
QuantiFERON TB1 Ag Value: 0.29 IU/mL
QuantiFERON TB2 Ag Value: 0.32 IU/mL
QuantiFERON-TB Gold Plus: NEGATIVE

## 2022-02-21 LAB — HEPATITIS B SURFACE ANTIGEN: Hepatitis B Surface Ag: NEGATIVE

## 2022-02-21 LAB — HEPATITIS B SURFACE ANTIBODY,QUALITATIVE: Hep B Surface Ab, Qual: REACTIVE

## 2022-02-21 LAB — HEPATITIS B CORE ANTIBODY, TOTAL: Hep B Core Total Ab: NEGATIVE

## 2022-02-21 LAB — HIV ANTIBODY (ROUTINE TESTING W REFLEX): HIV Screen 4th Generation wRfx: NONREACTIVE

## 2022-02-22 ENCOUNTER — Ambulatory Visit: Payer: PPO | Admitting: Family Medicine

## 2022-02-22 NOTE — Telephone Encounter (Signed)
I talked with the hospital and they said they stopped doing conscious sedation a few years ago so general anesthesia is the only sedation option.

## 2022-02-23 ENCOUNTER — Telehealth: Payer: Self-pay

## 2022-02-23 NOTE — Telephone Encounter (Signed)
PA for [facility] was approved by [employer].  "06-JUN-23:31-DEC-23 Mavenclad (7 Tabs) '10MG'$  OR TBPK Quantity:7;"

## 2022-02-23 NOTE — Telephone Encounter (Signed)
Received mavenclad start form from Dr. Felecia Shelling. Faxed to [place]. Received a receipt of confirmation.  PA completed via [facility] and sent to [employer]. Should have a determination within 3-5 business days. Key: [id number].

## 2022-02-24 NOTE — Telephone Encounter (Signed)
[facility] department is requesting a nurse call them to do a pre-op screening of this patient for sedation. Contact Kathi Der at [phone]

## 2022-02-26 NOTE — Telephone Encounter (Signed)
I sent her an email when you asked me this on 6/7 and asked for another phone number, but she has not answered me. I will try to get in touch with them on Monday.

## 2022-03-01 NOTE — Telephone Encounter (Signed)
I called [facility] to discuss patient's mavenclad status. Due to unforseen circumstances that office is closed. Will try again tomorrow.

## 2022-03-02 NOTE — Telephone Encounter (Signed)
I never received a response from [place] at [place]. I sent another fax with our contact info and asked her to call us with the info they need.

## 2022-03-02 NOTE — Telephone Encounter (Signed)
I called [facility]. They have been unsuccessful at reaching patient. She needs to call [facility] and ask for [facility]. [place]'s extension ins [phone].  I called patient. I provided her with this information. She will call [facility]. I will check with her next week. Patient had questions about her MRI and I transferred her call to [facility].

## 2022-03-05 ENCOUNTER — Ambulatory Visit: Payer: PPO | Admitting: Family Medicine

## 2022-03-08 NOTE — Telephone Encounter (Signed)
I called patient. She has been in touch with [facility] and her PAP application is in process. I will check with her next week. Pt verbalized understanding.

## 2022-03-08 NOTE — Telephone Encounter (Signed)
I called patient to discuss if she was able to reach [place]. No answer, left a message asking her to call me back.

## 2022-03-08 NOTE — Telephone Encounter (Signed)
Pt returned phone call, would like a call back.  

## 2022-03-15 ENCOUNTER — Other Ambulatory Visit (HOSPITAL_COMMUNITY): Payer: Self-pay

## 2022-03-15 NOTE — Telephone Encounter (Signed)
I called patient to find out the status of her PAP application for mavenclad. No answer, left a message asking her to call me back.

## 2022-03-19 ENCOUNTER — Other Ambulatory Visit (HOSPITAL_COMMUNITY): Payer: Self-pay

## 2022-03-19 MED ORDER — HYDROCHLOROTHIAZIDE 12.5 MG PO CAPS
12.5000 mg | ORAL_CAPSULE | Freq: Every day | ORAL | 3 refills | Status: DC
  Filled 2022-03-19: qty 60, 30d supply, fill #0
  Filled 2022-05-09: qty 60, 30d supply, fill #1
  Filled 2022-07-19: qty 60, 30d supply, fill #2
  Filled 2022-09-16: qty 60, 30d supply, fill #3

## 2022-03-19 MED ORDER — BUPROPION HCL 75 MG PO TABS
75.0000 mg | ORAL_TABLET | Freq: Every day | ORAL | 0 refills | Status: DC
  Filled 2022-03-19: qty 30, 30d supply, fill #0

## 2022-03-22 NOTE — Telephone Encounter (Signed)
Received fax from [place] that pt approved for pt assistance.

## 2022-03-24 ENCOUNTER — Ambulatory Visit: Payer: PPO | Admitting: Neurology

## 2022-03-25 NOTE — Telephone Encounter (Signed)
Received fax from [place] that pt scheduled delivery of [place]. Should receive on 03/30/22.

## 2022-04-05 NOTE — Telephone Encounter (Signed)
I spoke with Dr. Felecia Shelling. Once her oral abx course is finished, she has no fever, and her skin looks better, she can start mavenclad. I called patient to discuss. No answer, left a message asking her to call me back.

## 2022-04-05 NOTE — Telephone Encounter (Signed)
I called patient. She has received mavenclad but has not started taking it. She had a bad fall and she has cellulitis in her knee which is currently being treated with antibiotics. She will finish with the oral abx on Saturday. I will check with Dr. Felecia Shelling to see if he has any recommendations on starting mavenclad. Pt verbalized understanding.

## 2022-04-08 NOTE — Telephone Encounter (Signed)
Pt called back and I was able to inform her what Cyril Mourning stated in regards to when she start mavenclad.  I asked the patient if she could just let us know when she does start it. Pt verbalized understanding. Pt had no questions at this time but was encouraged to call back if questions arise.

## 2022-04-10 ENCOUNTER — Other Ambulatory Visit (HOSPITAL_COMMUNITY): Payer: Self-pay

## 2022-04-11 ENCOUNTER — Other Ambulatory Visit (HOSPITAL_COMMUNITY): Payer: Self-pay

## 2022-04-12 ENCOUNTER — Other Ambulatory Visit (HOSPITAL_COMMUNITY): Payer: Self-pay

## 2022-04-13 NOTE — Progress Notes (Unsigned)
Cynthia Nichols [address] [place] Phone: [phone] Subjective:   [name], am serving as a scribe for Dr. Hulan Saas.  I'm seeing this patient by the request  of:  Clydie Braun T, FNP (Inactive)  CC: Right knee pain and swelling  SVX:[id number]  12/28/2021 Difficult to say with this in the left lower extremity if this is more secondary to a lumbar radiculopathy or the possibility of a multiple sclerosis flare.  We discussed certain things such as laboratory work-up and a sedimentation rate, versus possible advanced imaging of the lumbar spine which is also within the significant differential.  Patient states at this point patient would like to have treatment for more the inflammation and was given Toradol and Depo-Medrol today.  We discussed home exercises and icing regimen, discussed which activities to do which ones to avoid.  Follow-up with me again in4 weeks.  Updated 04/14/2022 Cynthia Nichols is a 61 y.o. female coming in with complaint of right knee pain.  Patient has known degenerative arthritic changes of the knee.  Past medical history significant for MS.  This is caused some instability of the knee. Knee pain has been up and down. Cocktail injection did help for pain in back. [place] about 2-3 weeks on right knee. Just wants check.       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   Essential hypertension, benign 11/29/2017   Fracture of fifth metatarsal bone 08/13/2015   Greater trochanteric bursitis, left 12/31/2019   Injection given December 31, 2019    Headache(784.0)    Humerus fracture    right with radial nerve palsy   Hypertension    IBS (irritable bowel syndrome)    Migraine without aura and without status migrainosus, not intractable 02/09/2018   MS (multiple sclerosis) ([place])    Multiple sclerosis, relapsing-remitting ([place]) 12/14/2011   Osteoporosis 11/29/2017   Other fracture of shaft of right humerus, initial encounter for closed fracture 05/13/2017   Patella fracture 07/03/2015   Hairline oblique     Patellar subluxation, left, initial encounter 08/14/2018   PONV (postoperative nausea and vomiting)    only on this surgery   Radial nerve palsy, 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   Right-sided    Unspecified venous (peripheral) insufficiency 11/09/2011   Venous aneurysm    Right side of neck   Past Surgical History:  Procedure Laterality Date   ABDOMINAL HYSTERECTOMY     BREAST MASS EXCISION     Bilateral breast mass excision ( Benign)   ORIF HUMERUS FRACTURE Right 05/17/2017   RADIOLOGY WITH ANESTHESIA N/A 11/03/2021   Procedure: MRI WITH BRAIN WITH AND WITHOUT,CERVICAL WITH AND WITHOUT CONTRAST;  Surgeon: Radiologist, Medication, MD;  Location: [place];  Service: Radiology;  Laterality: N/A;   TENNIS ELBOW RELEASE/NIRSCHEL PROCEDURE Right 05/17/2017   Procedure: EXPLORATION AND EXPOSURE OF RADIAL NERVE RIGHT ARM;  Surgeon: Roseanne Kaufman, MD;  Location: [place];  Service: Orthopedics;  Laterality: Right;   Social History   Socioeconomic History   Marital status:  Married    Spouse name: Brinna Divelbiss   Number of children: 3   Years of education: Not on file   Highest education level: Associate degree: occupational, Hotel manager, or vocational program  Occupational History   Occupation: disabled    Comment: Therapist, sports  Tobacco Use   Smoking status: Never   Smokeless tobacco: Never  Vaping Use   Vaping Use: Never used  Substance and Sexual Activity   Alcohol use: No    Alcohol/week: 0.0 standard  drinks of alcohol   Drug use: No   Sexual activity: Yes    Partners: Male  Other Topics Concern   Not on file  Social History Narrative   Not on file   Social Determinants of Health   Financial Resource Strain: Low Risk  (02/08/2019)   Overall Financial Resource Strain (CARDIA)    Difficulty of Paying Living Expenses: Not hard at all  Food Insecurity: No Food Insecurity (02/08/2019)   Hunger Vital Sign    Worried About Running Out of Food in the Last Year: Never true    [place] in the Last Year: Never true  Transportation Needs: No Transportation Needs (02/08/2019)   PRAPARE - Hydrologist (Medical): No    Lack of Transportation (Non-Medical): No  Physical Activity: Sufficiently Active (02/08/2019)   Exercise Vital Sign    Days of Exercise per Week: 7 days    Minutes of Exercise per Session: 30 min  Stress: No Stress Concern Present (02/08/2019)   [place]    Feeling of Stress : Not at all  Social Connections: [place] (02/08/2019)   Social Connection and Isolation Panel [NHANES]    Frequency of Communication with Friends and Family: More than three times a week    Frequency of Social Gatherings with Friends and Family: Once a week    Attends Religious Services: More than 4 times per year    Active Member of [employer] or Organizations: Yes    Attends Music therapist: More than 4 times per year    Marital Status: Married   Allergies  Allergen Reactions   Ampyra [Dalfampridine] Anaphylaxis and Shortness Of Breath    Chest pain, also    Imitrex [Sumatriptan] Other (See Comments)    Chest tightness   Crestor [Rosuvastatin Calcium] Other (See Comments)    Abdominal pain, shortness of breath   Peanut-Containing Drug Products Itching    Itching in the mouth (remarked that she still eats this in limited amounts)   Shrimp [Shellfish Allergy] Itching    Itching  in the mouth (remarked that she still eats this in limited amounts)   Family History  Adopted: Yes     Current Outpatient Medications (Cardiovascular):    bisoprolol (ZEBETA) 5 MG tablet, Take 1/2 to 1 tablet (2.5-5 mg total) by mouth daily for elevated blood pressure   candesartan (ATACAND) 8 MG tablet, Take 1 tablet (8 mg total) by mouth daily. (Patient taking differently: Take 4 mg by mouth daily as needed (Blood pressure).)   hydrochlorothiazide (MICROZIDE) 12.5 MG capsule, Take 1-2 capsules (12.5-25 mg total) by mouth daily for blood pressure   pravastatin (PRAVACHOL) 20 MG tablet, TAKE 1 TABLET BY MOUTH DAILY (Patient taking differently: Take 20 mg by mouth at bedtime.)   pravastatin (PRAVACHOL) 20 MG tablet, Take 1 tablet (20 mg total) by mouth daily.  Current Outpatient Medications (Respiratory):    albuterol (PROVENTIL) (2.5 MG/3ML)  0.083% nebulizer solution, USE 1 VIAL VIA NEBULIZER EVERY 4 TO 6 HOURS AS NEEDED FOR SHORTNESS OF BREATH Cynthia Nichols  Current Outpatient Medications (Analgesics):    naratriptan (AMERGE) 2.5 MG tablet, Take 1 tablet (2.5 mg total) by mouth as directed for Migraine. May take a second dose after 4 hours if needed.   Current Outpatient Medications (Other):    doxycycline (VIBRA-TABS) 100 MG tablet, Take 1 tablet (100 mg total) by mouth 2 (two) times daily.   amphetamine-dextroamphetamine (ADDERALL) 10 MG tablet, Take 1 tablet (10 mg total) by mouth 2 (two) times daily.   buPROPion (WELLBUTRIN) 75 MG tablet, Take 1 tablet (75 mg total) by mouth daily.   Cholecalciferol (VITAMIN D) 50 MCG (2000 UT) CAPS, Take 5,000 Units by mouth every 30 (thirty) days.   Cladribine, 8 Tabs, (MAVENCLAD, 8 TABS,) 10 MG TBPK, Take by mouth See admin instructions. Take once a year will start in July  Month one: Day 1-3: 2 tabs/day, Day 4 and 5: 1 tab/day Month two: Day 1 and 2: 2 tabs/day, Days 3-5: 1 tab/day   ergocalciferol (VITAMIN D2) 1.25 MG (50000 UT) capsule, Take 1  capsule (50,000 Units total) by mouth once a week.   gabapentin (NEURONTIN) 600 MG tablet, Take 1 tablet (600 mg total) by mouth at bedtime.   omeprazole (PRILOSEC) 20 MG capsule, Take 1 capsule (20 mg total) by mouth daily 30 minutes before morning meal   potassium chloride (MICRO-K) 10 MEQ CR capsule, Take 1 capsule (10 mEq total) by mouth daily.   potassium chloride SA (KLOR-CON M20) 20 MEQ tablet, Take 1 tablet (20 mEq total) by mouth 2 (two) times daily with food for 7 days for hypokalemia, THEN 1 tablet (20 mEq total) daily. (Patient taking differently: Take 10 meq daily)   zolpidem (AMBIEN CR) 6.25 MG CR tablet, Take 1 tablet by mouth at bedtime   zonisamide (ZONEGRAN) 100 MG capsule, Take 1 capsule (100 mg total) by mouth at bedtime.   Reviewed prior external information including notes and imaging from  primary care provider As well as notes that were available from care everywhere and other healthcare systems.  Past medical history, social, surgical and family history all reviewed in electronic medical record.  No pertanent information unless stated regarding to the chief complaint.   Review of Systems:  No headache, visual changes, nausea, vomiting, diarrhea, constipation, dizziness, abdominal pain, skin rash, fevers, chills, night sweats, weight loss, swollen lymph nodes, body aches, joint swelling, chest pain, shortness of breath, mood changes. POSITIVE muscle aches  Objective  Blood pressure 106/68, pulse 75, height '5\' 7"'$  (1.702 m), weight 154 lb (69.9 kg), SpO2 97 %.   General: No apparent distress alert and oriented x3 mood and affect normal, dressed appropriately.  HEENT: Pupils equal, extraocular movements intact  Respiratory: Patient's speak in full sentences and does not appear short of breath  Cardiovascular: Trace trace lower extremity edema, non tender, no erythema  Patient does have an antalgic gait favoring the right leg.  Using the aid of a cane. Patient's right  knee does have abrasions noted in varying degree of healing. Patient does have some erythema noted on the anterior aspect of the knee.  Tender to palpation over the patella itself.  Good range of motion otherwise but some mild crepitus noted.  Limited muscular skeletal ultrasound was performed and interpreted by Hulan Saas, M  Limited ultrasound of patient's knee does have some hypoechoic changes noted.  Patient does have some increasing in  Doppler flow of the fat pad that could be either secondary to possible inflammation or infectious etiology.  No loose bodies appreciated.  Patient does have some increased thickening of the anterior patella that is consistent with potential bruising and hyperechoic changes that is consistent with small Impression: Likely knee contusion with questionable infectious etiology    Impression and Recommendations:     The above documentation has been reviewed and is accurate and complete Lyndal Pulley, DO

## 2022-04-14 ENCOUNTER — Ambulatory Visit: Payer: Self-pay

## 2022-04-14 ENCOUNTER — Ambulatory Visit (INDEPENDENT_AMBULATORY_CARE_PROVIDER_SITE_OTHER): Payer: PPO

## 2022-04-14 ENCOUNTER — Encounter: Payer: Self-pay | Admitting: Family Medicine

## 2022-04-14 ENCOUNTER — Ambulatory Visit: Payer: PPO | Admitting: Family Medicine

## 2022-04-14 ENCOUNTER — Other Ambulatory Visit (HOSPITAL_COMMUNITY): Payer: Self-pay

## 2022-04-14 VITALS — BP 106/68 | HR 75 | Ht 67.0 in | Wt 154.0 lb

## 2022-04-14 DIAGNOSIS — S8000XA Contusion of unspecified knee, initial encounter: Secondary | ICD-10-CM | POA: Insufficient documentation

## 2022-04-14 DIAGNOSIS — S8001XA Contusion of right knee, initial encounter: Secondary | ICD-10-CM

## 2022-04-14 DIAGNOSIS — M1711 Unilateral primary osteoarthritis, right knee: Secondary | ICD-10-CM

## 2022-04-14 MED ORDER — DOXYCYCLINE HYCLATE 100 MG PO TABS
100.0000 mg | ORAL_TABLET | Freq: Two times a day (BID) | ORAL | 0 refills | Status: DC
  Filled 2022-04-14: qty 14, 7d supply, fill #0

## 2022-04-14 NOTE — Assessment & Plan Note (Signed)
Patient does have more of a knee contusion noted.  I do not see a true fracture but we will need to continue to monitor.  Patient does have underlying MS that could be contributing and this could slow down potential healing.  We may need to consider possibly increasing the vitamin D again.  Patient does have some hypoechoic changes and I am concerned that the amount of Doppler flow in the area and that there could be potentially small cellulitis.  Patient given doxycycline to help with the possibility of a infectious etiology.  Follow-up with me again in 3 to 4 weeks

## 2022-04-14 NOTE — Patient Instructions (Addendum)
Prescription sent in [place] lotion 2x a day Will take another 6 weeks for knee to heal all the way Buddy tape for pinky finger for 2 weeks See you again in 4-6 weeks okay to double

## 2022-04-19 ENCOUNTER — Ambulatory Visit: Payer: PPO | Admitting: Family Medicine

## 2022-04-19 ENCOUNTER — Other Ambulatory Visit (HOSPITAL_COMMUNITY): Payer: Self-pay

## 2022-04-19 NOTE — Telephone Encounter (Signed)
I called patient. She is still taking oral abx for cellulitis. I will check with her in a few weeks to see how she is doing. She has not started [facility] yet.

## 2022-04-20 ENCOUNTER — Other Ambulatory Visit (HOSPITAL_COMMUNITY): Payer: Self-pay

## 2022-04-21 ENCOUNTER — Other Ambulatory Visit (HOSPITAL_COMMUNITY): Payer: Self-pay

## 2022-04-21 MED ORDER — POTASSIUM CHLORIDE ER 10 MEQ PO CPCR
10.0000 meq | ORAL_CAPSULE | Freq: Every day | ORAL | 1 refills | Status: DC
  Filled 2022-04-21: qty 30, 30d supply, fill #0
  Filled 2022-06-10: qty 30, 30d supply, fill #1

## 2022-04-22 ENCOUNTER — Other Ambulatory Visit (HOSPITAL_COMMUNITY): Payer: Self-pay

## 2022-04-26 ENCOUNTER — Other Ambulatory Visit (HOSPITAL_COMMUNITY): Payer: Self-pay

## 2022-04-27 ENCOUNTER — Other Ambulatory Visit (HOSPITAL_COMMUNITY): Payer: Self-pay

## 2022-04-28 ENCOUNTER — Other Ambulatory Visit (HOSPITAL_COMMUNITY): Payer: Self-pay

## 2022-04-28 MED ORDER — BUPROPION HCL 75 MG PO TABS
75.0000 mg | ORAL_TABLET | Freq: Every day | ORAL | 0 refills | Status: DC
  Filled 2022-04-28: qty 30, 30d supply, fill #0

## 2022-04-28 MED ORDER — BUPROPION HCL 100 MG PO TABS
100.0000 mg | ORAL_TABLET | Freq: Every day | ORAL | 3 refills | Status: DC
  Filled 2022-04-28: qty 90, 90d supply, fill #0
  Filled 2022-07-19: qty 90, 90d supply, fill #1

## 2022-04-28 MED ORDER — ZONISAMIDE 100 MG PO CAPS
100.0000 mg | ORAL_CAPSULE | Freq: Every day | ORAL | 3 refills | Status: DC
  Filled 2022-04-28: qty 90, 90d supply, fill #0

## 2022-05-07 ENCOUNTER — Other Ambulatory Visit (HOSPITAL_COMMUNITY): Payer: Self-pay

## 2022-05-10 ENCOUNTER — Other Ambulatory Visit (HOSPITAL_COMMUNITY): Payer: Self-pay

## 2022-05-10 NOTE — Telephone Encounter (Signed)
I called patient to check if she has started [facility]. No answer, left a message asking her to call us back.

## 2022-05-17 ENCOUNTER — Other Ambulatory Visit: Payer: Self-pay | Admitting: Neurology

## 2022-05-17 ENCOUNTER — Other Ambulatory Visit (HOSPITAL_COMMUNITY): Payer: Self-pay

## 2022-05-17 MED ORDER — AMPHETAMINE-DEXTROAMPHETAMINE 10 MG PO TABS
10.0000 mg | ORAL_TABLET | Freq: Two times a day (BID) | ORAL | 0 refills | Status: DC
  Filled 2022-05-17: qty 60, 30d supply, fill #0

## 2022-05-17 MED ORDER — BISOPROLOL FUMARATE 5 MG PO TABS
2.5000 mg | ORAL_TABLET | Freq: Every day | ORAL | 2 refills | Status: DC
  Filled 2022-05-17: qty 30, 30d supply, fill #0

## 2022-05-17 NOTE — Telephone Encounter (Signed)
Last seen 02/17/22. Per drug registry, last refilled 02/17/22 #[id number].

## 2022-05-17 NOTE — Telephone Encounter (Signed)
I called patient.  She reports that her cellulitis has cleared up.  She is no longer taking antibiotics.  She plans on starting [place] within this next week.  I will check on her next week to make sure she is starting [place] and is tolerating it well.  Patient verbalized understanding.

## 2022-05-17 NOTE — Progress Notes (Deleted)
[facility] [place] Phone: [phone] Subjective:    I'm seeing this patient by the request  of:  Leilani Able, FNP (Inactive)  CC:   [id number]  04/14/2022 Patient does have more of a knee contusion noted.  I do not see a true fracture but we will need to continue to monitor.  Patient does have underlying MS that could be contributing and this could slow down potential healing.  We may need to consider possibly increasing the vitamin D again.   Patient does have some hypoechoic changes and I am concerned that the amount of Doppler flow in the area and that there could be potentially small cellulitis.  Patient given doxycycline to help with the possibility of a infectious etiology.  Follow-up with me again in 3 to 4 weeks  Updated 05/19/2022 Cynthia Nichols is a 61 y.o. 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   Essential hypertension, benign 11/29/2017   Fracture of fifth metatarsal bone 08/13/2015   Greater trochanteric bursitis, left 12/31/2019   Injection given December 31, 2019   Headache(784.0)    Humerus fracture    right with radial nerve palsy   Hypertension    IBS (irritable bowel syndrome)    Migraine without aura and without status migrainosus, not intractable 02/09/2018   MS (multiple sclerosis) ([place])    Multiple sclerosis, relapsing-remitting ([place]) 12/14/2011   Osteoporosis 11/29/2017   Other fracture of shaft of right humerus, initial encounter for closed fracture  05/13/2017   Patella fracture 07/03/2015   Hairline oblique     Patellar subluxation, left, initial encounter 08/14/2018   PONV (postoperative nausea and vomiting)    only on this surgery   Radial nerve palsy, 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   Right-sided    Unspecified venous (peripheral) insufficiency 11/09/2011   Venous aneurysm    Right side of neck   Past Surgical History:  Procedure Laterality Date   ABDOMINAL HYSTERECTOMY     BREAST MASS EXCISION     Bilateral breast mass excision ( Benign)   ORIF HUMERUS FRACTURE Right 05/17/2017   RADIOLOGY WITH ANESTHESIA N/A 11/03/2021   Procedure: MRI WITH BRAIN WITH AND WITHOUT,CERVICAL WITH AND WITHOUT CONTRAST;  Surgeon: Radiologist, Medication, MD;  Location: [facility];  Service: Radiology;  Laterality: N/A;   TENNIS ELBOW RELEASE/NIRSCHEL PROCEDURE Right 05/17/2017   Procedure: EXPLORATION AND EXPOSURE OF RADIAL NERVE RIGHT ARM;  Surgeon: Roseanne Kaufman, MD;  Location: [facility];  Service: Orthopedics;  Laterality: Right;   Social History   Socioeconomic History   Marital status: Married    Spouse name: Victora Irby   Number of children: 3   Years of education: Not on file   Highest education level: Associate degree: occupational, Hotel manager, or vocational program  Occupational History   Occupation: disabled    Comment: RN  Tobacco Use   Smoking status: Never   Smokeless tobacco: Never  Vaping Use  Vaping Use: Never used  Substance and Sexual Activity   Alcohol use: No    Alcohol/week: 0.0 standard drinks of alcohol   Drug use: No   Sexual activity: Yes    Partners: Male  Other Topics Concern   Not on file  Social History Narrative   Not on file   Social Determinants of Health   Financial Resource Strain: Low Risk  (02/08/2019)   Overall Financial Resource Strain (CARDIA)    Difficulty of Paying Living Expenses: Not hard at all  Food Insecurity: No Food Insecurity (02/08/2019)   Hunger Vital  Sign    Worried About Running Out of Food in the Last Year: Never true    [place] in the Last Year: Never true  Transportation Needs: No Transportation Needs (02/08/2019)   PRAPARE - Hydrologist (Medical): No    Lack of Transportation (Non-Medical): No  Physical Activity: Sufficiently Active (02/08/2019)   Exercise Vital Sign    Days of Exercise per Week: 7 days    Minutes of Exercise per Session: 30 min  Stress: No Stress Concern Present (02/08/2019)   [place]    Feeling of Stress : Not at all  Social Connections: [place] (02/08/2019)   Social Connection and Isolation Panel [NHANES]    Frequency of Communication with Friends and Family: More than three times a week    Frequency of Social Gatherings with Friends and Family: Once a week    Attends Religious Services: More than 4 times per year    Active Member of [employer] or Organizations: Yes    Attends Music therapist: More than 4 times per year    Marital Status: Married   Allergies  Allergen Reactions   Ampyra [Dalfampridine] Anaphylaxis and Shortness Of Breath    Chest pain, also    Imitrex [Sumatriptan] Other (See Comments)    Chest tightness   Crestor [Rosuvastatin Calcium] Other (See Comments)    Abdominal pain, shortness of breath   Peanut-Containing Drug Products Itching    Itching in the mouth (remarked that she still eats this in limited amounts)   Shrimp [Shellfish Allergy] Itching    Itching in the mouth (remarked that she still eats this in limited amounts)   Family History  Adopted: Yes     Current Outpatient Medications (Cardiovascular):    bisoprolol (ZEBETA) 5 MG tablet, Take 1/2 to 1 tablet (2.5-5 mg total) by mouth daily for elevated blood pressure   candesartan (ATACAND) 8 MG tablet, Take 1 tablet (8 mg total) by mouth daily. (Patient taking differently: Take 4 mg by mouth daily  as needed (Blood pressure).)   hydrochlorothiazide (MICROZIDE) 12.5 MG capsule, Take 1-2 capsules (12.5-25 mg total) by mouth daily for blood pressure   pravastatin (PRAVACHOL) 20 MG tablet, TAKE 1 TABLET BY MOUTH DAILY (Patient taking differently: Take 20 mg by mouth at bedtime.)   pravastatin (PRAVACHOL) 20 MG tablet, Take 1 tablet (20 mg total) by mouth daily.  Current Outpatient Medications (Respiratory):    albuterol (PROVENTIL) (2.5 MG/3ML) 0.083% nebulizer solution, USE 1 VIAL VIA NEBULIZER EVERY 4 TO 6 HOURS AS NEEDED FOR SHORTNESS OF BREATH Ellie Lunch  Current Outpatient Medications (Analgesics):    naratriptan (AMERGE) 2.5 MG tablet, Take 1 tablet (2.5 mg total) by mouth as directed for Migraine. May take a second dose after 4 hours if needed.   Current Outpatient Medications (Other):  amphetamine-dextroamphetamine (ADDERALL) 10 MG tablet, Take 1 tablet (10 mg total) by mouth 2 (two) times daily.   buPROPion (WELLBUTRIN) 100 MG tablet, Take 1 tablet (100 mg total) by mouth daily with breakfast.   Cholecalciferol (VITAMIN D) 50 MCG (2000 UT) CAPS, Take 5,000 Units by mouth every 30 (thirty) days.   Cladribine, 8 Tabs, (MAVENCLAD, 8 TABS,) 10 MG TBPK, Take by mouth See admin instructions. Take once a year will start in July  Month one: Day 1-3: 2 tabs/day, Day 4 and 5: 1 tab/day Month two: Day 1 and 2: 2 tabs/day, Days 3-5: 1 tab/day   doxycycline (VIBRA-TABS) 100 MG tablet, Take 1 tablet (100 mg total) by mouth 2 (two) times daily.   ergocalciferol (VITAMIN D2) 1.25 MG (50000 UT) capsule, Take 1 capsule (50,000 Units total) by mouth once a week.   gabapentin (NEURONTIN) 600 MG tablet, Take 1 tablet (600 mg total) by mouth at bedtime.   omeprazole (PRILOSEC) 20 MG capsule, Take 1 capsule (20 mg total) by mouth daily 30 minutes before morning meal   potassium chloride (MICRO-K) 10 MEQ CR capsule, Take 1 capsule (10 mEq total) by mouth daily.   potassium chloride SA (KLOR-CON M20) 20 MEQ  tablet, Take 1 tablet (20 mEq total) by mouth 2 (two) times daily with food for 7 days for hypokalemia, THEN 1 tablet (20 mEq total) daily. (Patient taking differently: Take 10 meq daily)   zolpidem (AMBIEN CR) 6.25 MG CR tablet, Take 1 tablet by mouth at bedtime   zonisamide (ZONEGRAN) 100 MG capsule, Take 1 capsule (100 mg total) by mouth at bedtime   Reviewed prior external information including notes and imaging from  primary care provider As well as notes that were available from care everywhere and other healthcare systems.  Past medical history, social, surgical and family history all reviewed in electronic medical record.  No pertanent information unless stated regarding to the chief complaint.   Review of Systems:  No headache, visual changes, nausea, vomiting, diarrhea, constipation, dizziness, abdominal pain, skin rash, fevers, chills, night sweats, weight loss, swollen lymph nodes, body aches, joint swelling, chest pain, shortness of breath, mood changes. POSITIVE muscle aches  Objective  There were no vitals taken for this visit.   General: No apparent distress alert and oriented x3 mood and affect normal, dressed appropriately.  HEENT: Pupils equal, extraocular movements intact  Respiratory: Patient's speak in full sentences and does not appear short of breath  Cardiovascular: No lower extremity edema, non tender, no erythema      Impression and Recommendations:

## 2022-05-18 ENCOUNTER — Other Ambulatory Visit (HOSPITAL_COMMUNITY): Payer: Self-pay

## 2022-05-19 ENCOUNTER — Ambulatory Visit: Payer: PPO | Admitting: Family Medicine

## 2022-05-25 NOTE — Telephone Encounter (Signed)
I called patient to discuss how she is tolerating mavenclad. She will also need an appointment 2 months after the first pill.  No answer, left a message asking her to call me back.

## 2022-05-31 NOTE — Telephone Encounter (Signed)
Received notice from [place] that patient started [facility] on 05/26/22.

## 2022-05-31 NOTE — Telephone Encounter (Signed)
I called patient again to discuss how she is tolerating mavenclad. She will also need an appointment 2 months after the first pill.   No answer, left a message asking her to call me back.

## 2022-06-07 ENCOUNTER — Other Ambulatory Visit (HOSPITAL_COMMUNITY): Payer: Self-pay

## 2022-06-07 MED ORDER — BISOPROLOL FUMARATE 5 MG PO TABS
2.5000 mg | ORAL_TABLET | Freq: Every day | ORAL | 3 refills | Status: DC
  Filled 2022-06-07 – 2022-07-19 (×2): qty 90, 90d supply, fill #0
  Filled 2022-11-07: qty 90, 90d supply, fill #1

## 2022-06-07 MED ORDER — PRAVASTATIN SODIUM 20 MG PO TABS
20.0000 mg | ORAL_TABLET | Freq: Every day | ORAL | 1 refills | Status: AC
Start: 2022-06-07 — End: ?
  Filled 2022-06-07 – 2022-07-11 (×2): qty 90, 90d supply, fill #0
  Filled 2022-09-30: qty 90, 90d supply, fill #1

## 2022-06-07 NOTE — Telephone Encounter (Signed)
I called patient. She reports that she completed her month one of mavenclad on 9/14. Start was around 05/26/22. She tolerated it well. She made a follow up on 08/09/22 at 2:30pm and is aware that labs will be completed at this visit.

## 2022-06-09 NOTE — Progress Notes (Unsigned)
[facility] [place] [address] [place] Phone: [phone] Subjective:   [name], am serving as a scribe for Dr. Hulan Saas.  I'm seeing this patient by the request  of:  Ronita Hipps, MD  CC: Right knee pain follow-up  [id number]  04/14/2022 Patient does have more of a knee contusion noted.  I do not see a true fracture but we will need to continue to monitor.  Patient does have underlying MS that could be contributing and this could slow down potential healing.  We may need to consider possibly increasing the vitamin D again.   Patient does have some hypoechoic changes and I am concerned that the amount of Doppler flow in the area and that there could be potentially small cellulitis.  Patient given doxycycline to help with the possibility of a infectious etiology.  Follow-up with me again in 3 to 4 weeks  Updated 06/10/2022 CERA RORKE is a 61 y.o. female coming in with complaint of right knee pain. Doing well. Moving in a positive direction. Has good and bad days when it comes to the pain. Knee is now popping which is new, but isn't associated with pain. More of an ache. 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   Essential hypertension, benign 11/29/2017   Fracture of fifth metatarsal bone 08/13/2015   Greater trochanteric bursitis, left 12/31/2019   Injection given December 31, 2019   Headache(784.0)    Humerus fracture    right with radial nerve palsy   Hypertension    IBS (irritable bowel syndrome)    Migraine without aura  and without status migrainosus, not intractable 02/09/2018   MS (multiple sclerosis) ([place])    Multiple sclerosis, relapsing-remitting ([place]) 12/14/2011   Osteoporosis 11/29/2017   Other fracture of shaft of right humerus, initial encounter for closed fracture 05/13/2017   Patella fracture 07/03/2015   Hairline oblique     Patellar subluxation, left, initial encounter 08/14/2018   PONV (postoperative nausea and vomiting)    only on this surgery   Radial nerve palsy, 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   Right-sided    Unspecified venous (peripheral) insufficiency 11/09/2011   Venous aneurysm    Right side of neck   Past Surgical History:  Procedure Laterality Date   ABDOMINAL HYSTERECTOMY     BREAST MASS EXCISION     Bilateral breast mass excision ( Benign)   ORIF HUMERUS FRACTURE Right 05/17/2017   RADIOLOGY WITH ANESTHESIA N/A 11/03/2021   Procedure: MRI WITH BRAIN WITH AND WITHOUT,CERVICAL WITH AND WITHOUT CONTRAST;  Surgeon: Radiologist, Medication, MD;  Location: [facility];  Service: Radiology;  Laterality: N/A;   TENNIS ELBOW RELEASE/NIRSCHEL PROCEDURE Right 05/17/2017   Procedure: EXPLORATION AND EXPOSURE OF RADIAL NERVE RIGHT ARM;  Surgeon: Roseanne Kaufman, MD;  Location: [facility];  Service: Orthopedics;  Laterality: Right;   Social History   Socioeconomic History   Marital status: Married    Spouse name: Rindi Beechy   Number of  children: 3   Years of education: Not on file   Highest education level: Associate degree: occupational, Hotel manager, or vocational program  Occupational History   Occupation: disabled    Comment: Therapist, sports  Tobacco Use   Smoking status: Never   Smokeless tobacco: Never  Vaping Use   Vaping Use: Never used  Substance and Sexual Activity   Alcohol use: No    Alcohol/week: 0.0 standard drinks of alcohol   Drug use: No   Sexual activity: Yes    Partners: Male  Other Topics Concern   Not on file  Social History Narrative   Not on  file   Social Determinants of Health   Financial Resource Strain: Low Risk  (02/08/2019)   Overall Financial Resource Strain (CARDIA)    Difficulty of Paying Living Expenses: Not hard at all  Food Insecurity: No Food Insecurity (02/08/2019)   Hunger Vital Sign    Worried About Running Out of Food in the Last Year: Never true    [place] in the Last Year: Never true  Transportation Needs: No Transportation Needs (02/08/2019)   PRAPARE - Hydrologist (Medical): No    Lack of Transportation (Non-Medical): No  Physical Activity: Sufficiently Active (02/08/2019)   Exercise Vital Sign    Days of Exercise per Week: 7 days    Minutes of Exercise per Session: 30 min  Stress: No Stress Concern Present (02/08/2019)   [place]    Feeling of Stress : Not at all  Social Connections: [place] (02/08/2019)   Social Connection and Isolation Panel [NHANES]    Frequency of Communication with Friends and Family: More than three times a week    Frequency of Social Gatherings with Friends and Family: Once a week    Attends Religious Services: More than 4 times per year    Active Member of [employer] or Organizations: Yes    Attends Music therapist: More than 4 times per year    Marital Status: Married   Allergies  Allergen Reactions   Ampyra [Dalfampridine] Anaphylaxis and Shortness Of Breath    Chest pain, also    Imitrex [Sumatriptan] Other (See Comments)    Chest tightness   Crestor [Rosuvastatin Calcium] Other (See Comments)    Abdominal pain, shortness of breath   Peanut-Containing Drug Products Itching    Itching in the mouth (remarked that she still eats this in limited amounts)   Shrimp [Shellfish Allergy] Itching    Itching in the mouth (remarked that she still eats this in limited amounts)   Family History  Adopted: Yes     Current Outpatient Medications  (Cardiovascular):    bisoprolol (ZEBETA) 5 MG tablet, Take 1/2 to 1 tablet (2.5-5 mg total) by mouth daily for elevated blood pressure   bisoprolol (ZEBETA) 5 MG tablet, Take 0.5-1 tablets (2.5-5 mg total) by mouth daily for elevated blood pressure   bisoprolol (ZEBETA) 5 MG tablet, Take 0.5-1 tablets (2.5-5 mg total) by mouth daily for elevated blood pressure   candesartan (ATACAND) 8 MG tablet, Take 1 tablet (8 mg total) by mouth daily. (Patient taking differently: Take 4 mg by mouth daily as needed (Blood pressure).)   hydrochlorothiazide (MICROZIDE) 12.5 MG capsule, Take 1-2 capsules (12.5-25 mg total) by mouth daily for blood pressure   pravastatin (PRAVACHOL) 20 MG tablet, TAKE 1 TABLET BY MOUTH DAILY (Patient taking differently: Take 20 mg by mouth at bedtime.)  pravastatin (PRAVACHOL) 20 MG tablet, Take 1 tablet (20 mg total) by mouth daily.  Current Outpatient Medications (Respiratory):    albuterol (PROVENTIL) (2.5 MG/3ML) 0.083% nebulizer solution, USE 1 VIAL VIA NEBULIZER EVERY 4 TO 6 HOURS AS NEEDED FOR SHORTNESS OF BREATH Ellie Lunch  Current Outpatient Medications (Analgesics):    naratriptan (AMERGE) 2.5 MG tablet, Take 1 tablet (2.5 mg total) by mouth as directed for Migraine. May take a second dose after 4 hours if needed.   Current Outpatient Medications (Other):    amphetamine-dextroamphetamine (ADDERALL) 10 MG tablet, Take 1 tablet (10 mg total) by mouth 2 (two) times daily. Call [phone] to schedule follow up for ongoing refills   buPROPion (WELLBUTRIN) 100 MG tablet, Take 1 tablet (100 mg total) by mouth daily with breakfast.   Cholecalciferol (VITAMIN D) 50 MCG (2000 UT) CAPS, Take 5,000 Units by mouth every 30 (thirty) days.   Cladribine, 8 Tabs, (MAVENCLAD, 8 TABS,) 10 MG TBPK, Take by mouth See admin instructions. Take once a year will start in July  Month one: Day 1-3: 2 tabs/day, Day 4 and 5: 1 tab/day Month two: Day 1 and 2: 2 tabs/day, Days 3-5: 1 tab/day    doxycycline (VIBRA-TABS) 100 MG tablet, Take 1 tablet (100 mg total) by mouth 2 (two) times daily.   ergocalciferol (VITAMIN D2) 1.25 MG (50000 UT) capsule, Take 1 capsule (50,000 Units total) by mouth once a week.   gabapentin (NEURONTIN) 600 MG tablet, Take 1 tablet (600 mg total) by mouth at bedtime.   omeprazole (PRILOSEC) 20 MG capsule, Take 1 capsule (20 mg total) by mouth daily 30 minutes before morning meal   potassium chloride (MICRO-K) 10 MEQ CR capsule, Take 1 capsule (10 mEq total) by mouth daily.   potassium chloride SA (KLOR-CON M20) 20 MEQ tablet, Take 1 tablet (20 mEq total) by mouth 2 (two) times daily with food for 7 days for hypokalemia, THEN 1 tablet (20 mEq total) daily. (Patient taking differently: Take 10 meq daily)   zolpidem (AMBIEN CR) 6.25 MG CR tablet, Take 1 tablet by mouth at bedtime   zonisamide (ZONEGRAN) 100 MG capsule, Take 1 capsule (100 mg total) by mouth at bedtime   Reviewed prior external information including notes and imaging from  primary care provider As well as notes that were available from care everywhere and other healthcare systems.  Past medical history, social, surgical and family history all reviewed in electronic medical record.  No pertanent information unless stated regarding to the chief complaint.   Review of Systems:  No headache, visual changes, nausea, vomiting, diarrhea, constipation, dizziness, abdominal pain, skin rash, fevers, chills, night sweats, weight loss, swollen lymph nodes, body aches, joint swelling, chest pain, shortness of breath, mood changes. POSITIVE muscle aches  Objective  Blood pressure 108/72, pulse 84, height '5\' 7"'$  (1.702 m), SpO2 99 %.   General: No apparent distress alert and oriented x3 mood and affect normal, dressed appropriately.  HEENT: Pupils equal, extraocular movements intact  Respiratory: Patient's speak in full sentences and does not appear short of breath  Cardiovascular: No lower extremity edema,  non tender, no erythema  Patient does have significant antalgic gait noted.  Patient does have some atrophy noted especially of the right lower extremity.  Patient's knee does have some tenderness noted over the patella.  Patient was healing from the contusion previously noted on the superficial skin.  Limited muscular skeletal ultrasound was performed and interpreted by Hulan Saas, M  Limited ultrasound of patient's  right patella shows still mild cortical irregularity with some mild callus formation noted.  Nothing seems to be displaced.  No increase in Doppler flow or any vascularization. Impression: Questionable healing patella fracture versus irregularity of the patella itself.    Impression and Recommendations:     The above documentation has been reviewed and is accurate and complete Lyndal Pulley, DO

## 2022-06-10 ENCOUNTER — Encounter: Payer: Self-pay | Admitting: Family Medicine

## 2022-06-10 ENCOUNTER — Other Ambulatory Visit (HOSPITAL_COMMUNITY): Payer: Self-pay

## 2022-06-10 ENCOUNTER — Ambulatory Visit: Payer: PPO | Admitting: Family Medicine

## 2022-06-10 ENCOUNTER — Ambulatory Visit: Payer: Self-pay

## 2022-06-10 VITALS — BP 108/72 | HR 84 | Ht 67.0 in

## 2022-06-10 DIAGNOSIS — M1711 Unilateral primary osteoarthritis, right knee: Secondary | ICD-10-CM | POA: Diagnosis not present

## 2022-06-10 DIAGNOSIS — S82002D Unspecified fracture of left patella, subsequent encounter for closed fracture with routine healing: Secondary | ICD-10-CM | POA: Diagnosis not present

## 2022-06-10 MED ORDER — ZOLPIDEM TARTRATE ER 6.25 MG PO TBCR
6.2500 mg | EXTENDED_RELEASE_TABLET | Freq: Every day | ORAL | 3 refills | Status: DC
  Filled 2022-06-10: qty 30, 30d supply, fill #0
  Filled 2022-07-11: qty 30, 30d supply, fill #1
  Filled 2022-08-11: qty 30, 30d supply, fill #2
  Filled 2022-09-07: qty 30, 30d supply, fill #3
  Filled 2022-09-09: qty 20, 20d supply, fill #3
  Filled 2022-09-09: qty 10, 10d supply, fill #3

## 2022-06-10 NOTE — Assessment & Plan Note (Signed)
Patient has a hairline on blade that seems to be healing at this time.  I believe the patient will continue to improve.  Discussed vitamin supplementation.  Has had difficulty with this previously on the right side.  I do think patient will continue to improve.  Follow-up again in 6 to 8 weeks otherwise.

## 2022-06-10 NOTE — Patient Instructions (Addendum)
Good to see you! Okay to increase activity Dr. Sharlet Salina

## 2022-06-29 ENCOUNTER — Other Ambulatory Visit: Payer: Self-pay | Admitting: Neurology

## 2022-06-29 ENCOUNTER — Other Ambulatory Visit (HOSPITAL_COMMUNITY): Payer: Self-pay

## 2022-06-29 MED ORDER — AMPHETAMINE-DEXTROAMPHETAMINE 10 MG PO TABS
10.0000 mg | ORAL_TABLET | Freq: Two times a day (BID) | ORAL | 0 refills | Status: DC
  Filled 2022-06-29: qty 60, 30d supply, fill #0

## 2022-06-29 NOTE — Telephone Encounter (Signed)
Pt has an up coming appt on 08/09/22 and has been checked on the registry.

## 2022-07-06 ENCOUNTER — Other Ambulatory Visit (HOSPITAL_COMMUNITY): Payer: Self-pay

## 2022-07-12 ENCOUNTER — Other Ambulatory Visit (HOSPITAL_COMMUNITY): Payer: Self-pay

## 2022-07-20 ENCOUNTER — Other Ambulatory Visit (HOSPITAL_COMMUNITY): Payer: Self-pay

## 2022-07-21 ENCOUNTER — Other Ambulatory Visit (HOSPITAL_COMMUNITY): Payer: Self-pay

## 2022-08-03 ENCOUNTER — Telehealth: Payer: Self-pay | Admitting: Cardiology

## 2022-08-03 NOTE — Telephone Encounter (Signed)
Patient requesting to switch from Dr. Bettina Gavia to Dr. Johnsie Cancel.

## 2022-08-06 NOTE — Progress Notes (Deleted)
[place] Phone: [phone] Subjective:    I'm seeing this patient by the request  of:  Ronita Hipps, MD  CC:   [id number]  06/10/2022 Patient has a hairline on blade that seems to be healing at this time.  I believe the patient will continue to improve.  Discussed vitamin supplementation.  Has had difficulty with this previously on the right side.  I do think patient will continue to improve.  Follow-up again in 6 to 8 weeks otherwise.   Update 08/10/2022 SHAVAUN OSTERLOH is a 61 y.o. female coming in with complaint of R knee pain. 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   Essential hypertension, benign 11/29/2017   Fracture of fifth metatarsal bone 08/13/2015   Greater trochanteric bursitis, left 12/31/2019   Injection given December 31, 2019   Headache(784.0)    Humerus fracture    right with radial nerve palsy   Hypertension    IBS (irritable bowel syndrome)    Migraine without aura and without status migrainosus, not intractable 02/09/2018   MS (multiple sclerosis) ([place])    Multiple sclerosis, relapsing-remitting ([place]) 12/14/2011   Osteoporosis 11/29/2017   Other fracture of shaft of right humerus, initial encounter for closed fracture 05/13/2017   Patella fracture 07/03/2015   Hairline oblique     Patellar subluxation, left, initial encounter 08/14/2018   PONV (postoperative nausea and vomiting)    only on this surgery   Radial nerve palsy, 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   SI (sacroiliac) joint dysfunction  10/23/2015   Right-sided    Unspecified venous (peripheral) insufficiency 11/09/2011   Venous aneurysm    Right side of neck   Past Surgical History:  Procedure Laterality Date   ABDOMINAL HYSTERECTOMY     BREAST MASS EXCISION     Bilateral breast mass excision ( Benign)   ORIF HUMERUS FRACTURE Right 05/17/2017   RADIOLOGY WITH ANESTHESIA N/A 11/03/2021   Procedure: MRI WITH BRAIN WITH AND WITHOUT,CERVICAL WITH AND WITHOUT CONTRAST;  Surgeon: Radiologist, Medication, MD;  Location: [facility];  Service: Radiology;  Laterality: N/A;   TENNIS ELBOW RELEASE/NIRSCHEL PROCEDURE Right 05/17/2017   Procedure: EXPLORATION AND EXPOSURE OF RADIAL NERVE RIGHT ARM;  Surgeon: Roseanne Kaufman, MD;  Location: [facility];  Service: Orthopedics;  Laterality: Right;   Social History   Socioeconomic History   Marital status: Married    Spouse name: Alecea Trego   Number of children: 3   Years of education: Not on file   Highest education level: Associate degree: occupational, Hotel manager, or vocational program  Occupational History   Occupation: disabled    Comment: Therapist, sports  Tobacco Use   Smoking status: Never   Smokeless tobacco: Never  Vaping Use   Vaping Use: Never used  Substance and Sexual Activity   Alcohol use: No    Alcohol/week: 0.0 standard drinks of alcohol   Drug use: No   Sexual activity: Yes    Partners: Male  Other Topics Concern   Not on  file  Social History Narrative   Not on file   Social Determinants of Health   Financial Resource Strain: Low Risk  (02/08/2019)   Overall Financial Resource Strain (CARDIA)    Difficulty of Paying Living Expenses: Not hard at all  Food Insecurity: No Food Insecurity (02/08/2019)   Hunger Vital Sign    Worried About Running Out of Food in the Last Year: Never true    Ran Out of Food in the Last Year: Never true  Transportation Needs: No Transportation Needs (02/08/2019)   PRAPARE - Hydrologist (Medical): No    Lack of  Transportation (Non-Medical): No  Physical Activity: Sufficiently Active (02/08/2019)   Exercise Vital Sign    Days of Exercise per Week: 7 days    Minutes of Exercise per Session: 30 min  Stress: No Stress Concern Present (02/08/2019)   [place]    Feeling of Stress : Not at all  Social Connections: [place] (02/08/2019)   Social Connection and Isolation Panel [NHANES]    Frequency of Communication with Friends and Family: More than three times a week    Frequency of Social Gatherings with Friends and Family: Once a week    Attends Religious Services: More than 4 times per year    Active Member of [employer] or Organizations: Yes    Attends Music therapist: More than 4 times per year    Marital Status: Married   Allergies  Allergen Reactions   Ampyra [Dalfampridine] Anaphylaxis and Shortness Of Breath    Chest pain, also    Imitrex [Sumatriptan] Other (See Comments)    Chest tightness   Crestor [Rosuvastatin Calcium] Other (See Comments)    Abdominal pain, shortness of breath   Peanut-Containing Drug Products Itching    Itching in the mouth (remarked that she still eats this in limited amounts)   Shrimp [Shellfish Allergy] Itching    Itching in the mouth (remarked that she still eats this in limited amounts)   Family History  Adopted: Yes     Current Outpatient Medications (Cardiovascular):    bisoprolol (ZEBETA) 5 MG tablet, Take 1/2 to 1 tablet (2.5-5 mg total) by mouth daily for elevated blood pressure   bisoprolol (ZEBETA) 5 MG tablet, Take 0.5-1 tablets (2.5-5 mg total) by mouth daily for elevated blood pressure   bisoprolol (ZEBETA) 5 MG tablet, Take 0.5-1 tablets (2.5-5 mg total) by mouth daily for elevated blood pressure   candesartan (ATACAND) 8 MG tablet, Take 1 tablet (8 mg total) by mouth daily. (Patient taking differently: Take 4 mg by mouth daily as needed (Blood pressure).)    hydrochlorothiazide (MICROZIDE) 12.5 MG capsule, Take 1-2 capsules (12.5-25 mg total) by mouth daily for blood pressure   pravastatin (PRAVACHOL) 20 MG tablet, TAKE 1 TABLET BY MOUTH DAILY (Patient taking differently: Take 20 mg by mouth at bedtime.)   pravastatin (PRAVACHOL) 20 MG tablet, Take 1 tablet (20 mg total) by mouth daily.  Current Outpatient Medications (Respiratory):    albuterol (PROVENTIL) (2.5 MG/3ML) 0.083% nebulizer solution, USE 1 VIAL VIA NEBULIZER EVERY 4 TO 6 HOURS AS NEEDED FOR SHORTNESS OF BREATH Ellie Lunch  Current Outpatient Medications (Analgesics):    naratriptan (AMERGE) 2.5 MG tablet, Take 1 tablet (2.5 mg total) by mouth as directed for Migraine. May take a second dose after 4 hours if needed.   Current Outpatient Medications (Other):    amphetamine-dextroamphetamine (ADDERALL) 10 MG tablet, Take  1 tablet (10 mg total) by mouth 2 (two) times daily. Call [phone] to schedule follow up for ongoing refills   buPROPion (WELLBUTRIN) 100 MG tablet, Take 1 tablet (100 mg total) by mouth daily with breakfast.   Cholecalciferol (VITAMIN D) 50 MCG (2000 UT) CAPS, Take 5,000 Units by mouth every 30 (thirty) days.   Cladribine, 8 Tabs, (MAVENCLAD, 8 TABS,) 10 MG TBPK, Take by mouth See admin instructions. Take once a year will start in July  Month one: Day 1-3: 2 tabs/day, Day 4 and 5: 1 tab/day Month two: Day 1 and 2: 2 tabs/day, Days 3-5: 1 tab/day   doxycycline (VIBRA-TABS) 100 MG tablet, Take 1 tablet (100 mg total) by mouth 2 (two) times daily.   ergocalciferol (VITAMIN D2) 1.25 MG (50000 UT) capsule, Take 1 capsule (50,000 Units total) by mouth once a week.   gabapentin (NEURONTIN) 600 MG tablet, Take 1 tablet (600 mg total) by mouth at bedtime.   omeprazole (PRILOSEC) 20 MG capsule, Take 1 capsule (20 mg total) by mouth daily 30 minutes before morning meal   potassium chloride (MICRO-K) 10 MEQ CR capsule, Take 1 capsule (10 mEq total) by mouth daily.   potassium  chloride SA (KLOR-CON M20) 20 MEQ tablet, Take 1 tablet (20 mEq total) by mouth 2 (two) times daily with food for 7 days for hypokalemia, THEN 1 tablet (20 mEq total) daily. (Patient taking differently: Take 10 meq daily)   zolpidem (AMBIEN CR) 6.25 MG CR tablet, Take 1 tablet by mouth at bedtime   zonisamide (ZONEGRAN) 100 MG capsule, Take 1 capsule (100 mg total) by mouth at bedtime   Reviewed prior external information including notes and imaging from  primary care provider As well as notes that were available from care everywhere and other healthcare systems.  Past medical history, social, surgical and family history all reviewed in electronic medical record.  No pertanent information unless stated regarding to the chief complaint.   Review of Systems:  No headache, visual changes, nausea, vomiting, diarrhea, constipation, dizziness, abdominal pain, skin rash, fevers, chills, night sweats, weight loss, swollen lymph nodes, body aches, joint swelling, chest pain, shortness of breath, mood changes. POSITIVE muscle aches  Objective  There were no vitals taken for this visit.   General: No apparent distress alert and oriented x3 mood and affect normal, dressed appropriately.  HEENT: Pupils equal, extraocular movements intact  Respiratory: Patient's speak in full sentences and does not appear short of breath  Cardiovascular: No lower extremity edema, non tender, no erythema      Impression and Recommendations:

## 2022-08-09 ENCOUNTER — Ambulatory Visit: Payer: PPO | Admitting: Neurology

## 2022-08-09 NOTE — Telephone Encounter (Signed)
Called patient. Per [facility] the patient is wanting to be seen for her BP issues and not SVT or Palpitations. She requests to switch to Dr. Johney Frame.

## 2022-08-10 ENCOUNTER — Ambulatory Visit: Payer: PPO | Admitting: Family Medicine

## 2022-08-11 ENCOUNTER — Other Ambulatory Visit (HOSPITAL_COMMUNITY): Payer: Self-pay

## 2022-08-11 ENCOUNTER — Ambulatory Visit: Payer: PPO | Admitting: Neurology

## 2022-08-11 ENCOUNTER — Encounter: Payer: Self-pay | Admitting: Neurology

## 2022-08-11 VITALS — BP 114/66 | HR 73 | Ht 67.0 in | Wt 151.5 lb

## 2022-08-11 DIAGNOSIS — F988 Other specified behavioral and emotional disorders with onset usually occurring in childhood and adolescence: Secondary | ICD-10-CM

## 2022-08-11 DIAGNOSIS — G43009 Migraine without aura, not intractable, without status migrainosus: Secondary | ICD-10-CM

## 2022-08-11 DIAGNOSIS — Z79899 Other long term (current) drug therapy: Secondary | ICD-10-CM | POA: Diagnosis not present

## 2022-08-11 DIAGNOSIS — R269 Unspecified abnormalities of gait and mobility: Secondary | ICD-10-CM

## 2022-08-11 DIAGNOSIS — G35 Multiple sclerosis: Secondary | ICD-10-CM

## 2022-08-11 DIAGNOSIS — E559 Vitamin D deficiency, unspecified: Secondary | ICD-10-CM

## 2022-08-11 DIAGNOSIS — R3915 Urgency of urination: Secondary | ICD-10-CM

## 2022-08-11 MED ORDER — BUPROPION HCL 100 MG PO TABS
100.0000 mg | ORAL_TABLET | Freq: Every day | ORAL | 3 refills | Status: DC
  Filled 2022-08-11 – 2022-09-30 (×2): qty 90, 90d supply, fill #0
  Filled 2023-01-10: qty 90, 90d supply, fill #1
  Filled 2023-04-08: qty 90, 90d supply, fill #2
  Filled 2023-07-10: qty 90, 90d supply, fill #3

## 2022-08-11 MED ORDER — AMPHETAMINE-DEXTROAMPHETAMINE 10 MG PO TABS
10.0000 mg | ORAL_TABLET | Freq: Two times a day (BID) | ORAL | 0 refills | Status: DC
  Filled 2022-08-11: qty 60, 30d supply, fill #0

## 2022-08-11 NOTE — Progress Notes (Signed)
[facility]  PATIENT: KAYLANIE CAPILI DOB: 10-13-1960  REFERRING DOCTOR OR PCP: Darlin Priestly, MD ([place]); Clydie Braun, FNP (PCP) SOURCE: Patient, notes from neurology, imaging and lab reports.  _________________________________   HISTORICAL  CHIEF COMPLAINT:  Chief Complaint  Patient presents with   Follow-up    Pt in room #1 and alone. Pt here today for f/u on Mavenclad for MS.    HISTORY OF PRESENT ILLNESS:  Bhavika Schnider is a 61 y.o. woman with multiple sclerosis.  She had no other exacerbations.     Update 08/11/2022: [name] was initiated late June 2022 and second month 04/12/21 - 04/16/21.    She felt very tired and achy the second week but was fine a few days later.      Lymphocytes were 0.6 in 05/2021 and 0.8 10/07/2020 and 0.9 02/17/2022   She never got the MRI last year we ordered (very claustophobic)  She did the second year of treatment 05/30/22-06/03/2022 and October.    She felt fine with the first week, except a HA.  However, she had a more severe HA and GI issues  Gait , balance and strength are stable.    Some stumbles but only rare fall   Right leg is weaker than left.  She uses her right AFO and a cane. She can walk a mile without a break in 25 minutes.     Around the house she often does not need cane.   She has some spasticity in her right leg.   She has numbness and tingling in her feet.    She has urge incontinence / nocturia.   Oxybutynin was poorly tolerated.   She would prefer not to take another pill (we had discussed Myrbetriq).   She has 2x nocturia.    She has fatigue helped a bit by Adderall.  She sleeps fairly well on Ambien CR 6.25 mg nightly (6-8 hours).  .Leg cramps are better with potassium.    She has anxiety > depression.   She notes has cognitive issue.  Word finding is difficult at times and mild difficulties with STM.   She has trouble with parallel processing.     Potassium has been low so she takes a  supplement.   She is on HCTZ.    She has LBP, worse with prolonged standing.    MRI lumbar in 2018 was normal by report.   She broke her right humerus in a fall in 2018.   Also has had right ankle fracture.    She has migraine headaches are doinw ell and she stopped zonisamide and Emgality,   She has non-migraine headaches more than migraine  MS History:  She had the onset of right leg weakness one day in 2011.  Symptoms did not improve.  However, she began to have bladder issues a few years earlier.    She initially was seen at [facility]  MRI of the cervical spine 11/12/2009 showed only the one spot at [facility].   She had EP studies (normal) and CSF analysis which was reporgtedly c/w MS.   She was placed on Copaxone.  She had episodes of diplopia which were fleeting.   She was switched to Tecfidera bit had trouble tolerating it.   After several years, she switched to Tysabri.   She felt great after the first dose but felt poorly after the second dose so she stopped.   About 2 years ago, she started Mayzent but stopped after  3 months due to feeling poorly.   Though no relapses after she stopped, gait has slowly worsened.  Mavenclad was initiated in June 022.      Imaging reports (actual images have been requested but were not available at the time of the visit): MRI of the brain report 06/14/2019 "No significant change nonenhancing white matter foci compatible with multiple sclerosis. No new lesion"  MRI of the cervical spine 06/14/2019 report "T2 signal: Dorsal lateral right at C1 stable. Right ventrolateral. C4 Which may or may not have been present previously. Bilateral ventral cord left greater than right as seen previously adjacent to extrusion as per below, question right progression versus artifact... Right lateral C6 stable.. Large longitudinal focus left  T2 stable on sagittal images.    Degenerative change with a large disc osteophyte complex at C5-C6 to the right with moderate bilateral foraminal  narrowing and moderate spinal stenosis.  milder problems at other levels.   REVIEW OF SYSTEMS: Constitutional: No fevers, chills, sweats, or change in appetite Eyes: No visual changes, double vision, eye pain Ear, nose and throat: No hearing loss, ear pain, nasal congestion, sore throat Cardiovascular: No chest pain, palpitations Respiratory:  No shortness of breath at rest or with exertion.   No wheezes GastrointestinaI: No nausea, vomiting, diarrhea, abdominal pain, fecal incontinence Genitourinary:  No dysuria, urinary retention or frequency.  No nocturia. Musculoskeletal:  No neck pain, back pain Integumentary: No rash, pruritus, skin lesions Neurological: as above Psychiatric: No depression at this time.  No anxiety Endocrine: No palpitations, diaphoresis, change in appetite, change in weigh or increased thirst Hematologic/Lymphatic:  No anemia, purpura, petechiae. Allergic/Immunologic: No itchy/runny eyes, nasal congestion, recent allergic reactions, rashes  ALLERGIES: Allergies  Allergen Reactions   Ampyra [Dalfampridine] Anaphylaxis and Shortness Of Breath    Chest pain, also    Imitrex [Sumatriptan] Other (See Comments)    Chest tightness   Crestor [Rosuvastatin Calcium] Other (See Comments)    Abdominal pain, shortness of breath   Peanut-Containing Drug Products Itching    Itching in the mouth (remarked that she still eats this in limited amounts)   Shrimp [Shellfish Allergy] Itching    Itching in the mouth (remarked that she still eats this in limited amounts)    HOME MEDICATIONS:  Current Outpatient Medications:    amphetamine-dextroamphetamine (ADDERALL) 10 MG tablet, Take 1 tablet (10 mg total) by mouth 2 (two) times daily. Call [phone] to schedule follow up for ongoing refills, Disp: 60 tablet, Rfl: 0   bisoprolol (ZEBETA) 5 MG tablet, Take 1/2 to 1 tablet (2.5-5 mg total) by mouth daily for elevated blood pressure, Disp: 30 tablet, Rfl: 2   bisoprolol  (ZEBETA) 5 MG tablet, Take 0.5-1 tablets (2.5-5 mg total) by mouth daily for elevated blood pressure, Disp: 30 tablet, Rfl: 2   bisoprolol (ZEBETA) 5 MG tablet, Take 0.5-1 tablets (2.5-5 mg total) by mouth daily for elevated blood pressure, Disp: 90 tablet, Rfl: 3   buPROPion (WELLBUTRIN) 100 MG tablet, Take 1 tablet (100 mg total) by mouth daily with breakfast., Disp: 90 tablet, Rfl: 3   candesartan (ATACAND) 8 MG tablet, Take 1 tablet (8 mg total) by mouth daily. (Patient taking differently: Take 4 mg by mouth daily as needed (Blood pressure).), Disp: 90 tablet, Rfl: 1   Cholecalciferol (VITAMIN D) 50 MCG (2000 UT) CAPS, Take 5,000 Units by mouth every 30 (thirty) days., Disp: , Rfl:    Cladribine, 8 Tabs, (MAVENCLAD, 8 TABS,) 10 MG TBPK, Take by mouth  See admin instructions. Take once a year will start in July  Month one: Day 1-3: 2 tabs/day, Day 4 and 5: 1 tab/day Month two: Day 1 and 2: 2 tabs/day, Days 3-5: 1 tab/day, Disp: , Rfl:    gabapentin (NEURONTIN) 600 MG tablet, Take 1 tablet (600 mg total) by mouth at bedtime., Disp: 90 tablet, Rfl: 3   hydrochlorothiazide (MICROZIDE) 12.5 MG capsule, Take 1-2 capsules (12.5-25 mg total) by mouth daily for blood pressure, Disp: 60 capsule, Rfl: 3   naratriptan (AMERGE) 2.5 MG tablet, Take 1 tablet (2.5 mg total) by mouth as directed for Migraine. May take a second dose after 4 hours if needed., Disp: 10 tablet, Rfl: 11   omeprazole (PRILOSEC) 20 MG capsule, Take 1 capsule (20 mg total) by mouth daily 30 minutes before morning meal, Disp: 30 capsule, Rfl: 1   potassium chloride (MICRO-K) 10 MEQ CR capsule, Take 1 capsule (10 mEq total) by mouth daily., Disp: 30 capsule, Rfl: 1   pravastatin (PRAVACHOL) 20 MG tablet, Take 1 tablet (20 mg total) by mouth daily., Disp: 90 tablet, Rfl: 1   zolpidem (AMBIEN CR) 6.25 MG CR tablet, Take 1 tablet by mouth at bedtime, Disp: 30 tablet, Rfl: 3   albuterol (PROVENTIL) (2.5 MG/3ML) 0.083% nebulizer solution, USE 1  VIAL VIA NEBULIZER EVERY 4 TO 6 HOURS AS NEEDED FOR SHORTNESS OF BREATH /WHEEZING, Disp: 225 mL, Rfl: 0   doxycycline (VIBRA-TABS) 100 MG tablet, Take 1 tablet (100 mg total) by mouth 2 (two) times daily. (Patient not taking: Reported on 08/11/2022), Disp: 14 tablet, Rfl: 0   ergocalciferol (VITAMIN D2) 1.25 MG (50000 UT) capsule, Take 1 capsule (50,000 Units total) by mouth once a week. (Patient not taking: Reported on 08/11/2022), Disp: 24 capsule, Rfl: 0   potassium chloride SA (KLOR-CON M20) 20 MEQ tablet, Take 1 tablet (20 mEq total) by mouth 2 (two) times daily with food for 7 days for hypokalemia, THEN 1 tablet (20 mEq total) daily. 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   Aortic atherosclerosis ([place]) 11/29/2017   Asthma    Asthma 11/02/2019   Closed fracture of shaft of right humerus 05/13/2017   Closed low lateral malleolus fracture, left, initial encounter 10/17/2018   COVID 07/2021   mild case   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   Essential hypertension, benign 11/29/2017   Fracture of fifth metatarsal bone 08/13/2015   Greater trochanteric bursitis, left 12/31/2019   Injection given December 31, 2019   Headache(784.0)    Humerus fracture    right with radial nerve palsy   Hypertension    IBS (irritable bowel syndrome)    Migraine without aura and without status migrainosus, not intractable 02/09/2018   MS (multiple sclerosis) ([place])    Multiple sclerosis,  relapsing-remitting ([place]) 12/14/2011   Osteoporosis 11/29/2017   Other fracture of shaft of right humerus, initial encounter for closed fracture 05/13/2017   Patella fracture 07/03/2015   Hairline oblique  Patellar subluxation, left, initial encounter 08/14/2018   PONV (postoperative nausea and vomiting)    only on this surgery   Radial nerve palsy, 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   Right-sided    Unspecified venous (peripheral) insufficiency 11/09/2011   Venous aneurysm    Right side of neck    PAST SURGICAL HISTORY: Past Surgical History:  Procedure Laterality Date   ABDOMINAL HYSTERECTOMY     BREAST MASS EXCISION     Bilateral breast mass excision ( Benign)   ORIF HUMERUS FRACTURE Right 05/17/2017   RADIOLOGY WITH ANESTHESIA N/A 11/03/2021   Procedure: MRI WITH BRAIN WITH AND WITHOUT,CERVICAL WITH AND WITHOUT CONTRAST;  Surgeon: Radiologist, Medication, MD;  Location: [facility];  Service: Radiology;  Laterality: N/A;   TENNIS ELBOW RELEASE/NIRSCHEL PROCEDURE Right 05/17/2017   Procedure: EXPLORATION AND EXPOSURE OF RADIAL NERVE RIGHT ARM;  Surgeon: Roseanne Kaufman, MD;  Location: [facility];  Service: Orthopedics;  Laterality: Right;    FAMILY HISTORY: Family History  Adopted: Yes    SOCIAL HISTORY:  Social History   Socioeconomic History   Marital status: Married    Spouse name: Enijah Furr   Number of children: 3   Years of education: Not on file   Highest education level: Associate degree: occupational, Hotel manager, or vocational program  Occupational History   Occupation: disabled    Comment: Therapist, sports  Tobacco Use   Smoking status: Never   Smokeless tobacco: Never  Vaping Use   Vaping Use: Never used  Substance and Sexual Activity   Alcohol use: No    Alcohol/week: 0.0 standard drinks of alcohol   Drug use: No   Sexual activity: Yes    Partners: Male  Other Topics Concern   Not on file  Social History Narrative   Not on file   Social  Determinants of Health   Financial Resource Strain: Low Risk  (02/08/2019)   Overall Financial Resource Strain (CARDIA)    Difficulty of Paying Living Expenses: Not hard at all  Food Insecurity: No Food Insecurity (02/08/2019)   Hunger Vital Sign    Worried About Running Out of Food in the Last Year: Never true    [place] in the Last Year: Never true  Transportation Needs: No Transportation Needs (02/08/2019)   PRAPARE - Hydrologist (Medical): No    Lack of Transportation (Non-Medical): No  Physical Activity: Sufficiently Active (02/08/2019)   Exercise Vital Sign    Days of Exercise per Week: 7 days    Minutes of Exercise per Session: 30 min  Stress: No Stress Concern Present (02/08/2019)   [place]    Feeling of Stress : Not at all  Social Connections: [place] (02/08/2019)   Social Connection and Isolation Panel [NHANES]    Frequency of Communication with Friends and Family: More than three times a week    Frequency of Social Gatherings with Friends and Family: Once a week    Attends Religious Services: More than 4 times per year    Active Member of [employer] or Organizations: Yes    Attends Archivist Meetings: More than 4 times per year    Marital Status: Married  Human resources officer Violence: Not At Risk (02/08/2019)   Humiliation, Afraid, Rape, and Kick questionnaire    Fear of Current or Ex-Partner: No    Emotionally Abused: No    Physically Abused: No  Sexually Abused: No     PHYSICAL EXAM  Vitals:   08/11/22 0809  BP: 114/66  Pulse: 73  Weight: 151 lb 8 oz (68.7 kg)  Height: '5\' 7"'$  (1.702 m)    Body mass index is 23.73 kg/m.   General: The patient is well-developed and well-nourished and in no acute distress  HEENT:  Head is [place]/AT.  Sclera are anicteric.    Neck: the neck is nontender.  Skin: Extremities are without rash or   edema.  Musculoskeletal:  Back is nontender  Neurologic Exam  Mental status: The patient is alert and oriented x 3 at the time of the examination. The patient has apparent normal recent and remote memory, with an apparently normal attention span and concentration ability.   Speech is normal.  Cranial nerves: Extraocular movements are full.  Facial strength appeared normal.  No dysarthria.  No obvious hearing deficits are noted.  Motor:  Muscle bulk is normal.   Tone is increased in the right leg.   Strength is 3/5 right hip flexors, 4+/5 quads, 4/5 hamstrings and 2+/5 toe/ankle extension.   Strength is  5 / 5 elsewhere.   Sensory: Sensory testing is intact to pinprick, soft touch and vibration sensation in all 4 extremities.  Coordination: Cerebellar testing reveals good finger-nose-finger and reduced heel to shin worse on right  Gait and station: Station is normal.   She has a wide based gait with a right foot drop.  She cannot do a tandem walk.  The Romberg is negative.  Reflexes: Deep tendon reflexes are symmetric and normal in arms, slightly increased right leg.       DIAGNOSTIC DATA (LABS, IMAGING, TESTING) - I reviewed patient records, labs, notes, testing and imaging myself where available.  Lab Results  Component Value Date   WBC 4.4 02/17/2022   HGB 14.0 02/17/2022   HCT 40.7 02/17/2022   MCV 93 02/17/2022   PLT 226 02/17/2022      Component Value Date/Time   NA 144 02/17/2022 1437   K 3.4 (L) 02/17/2022 1437   CL 105 02/17/2022 1437   CO2 26 02/17/2022 1437   GLUCOSE 91 02/17/2022 1437   GLUCOSE 120 (H) 07/11/2020 1708   BUN 19 02/17/2022 1437   CREATININE 0.91 02/17/2022 1437   CREATININE 0.59 02/09/2018 0930   CALCIUM 9.9 02/17/2022 1437   PROT 7.4 02/17/2022 1437   ALBUMIN 4.6 02/17/2022 1437   AST 22 02/17/2022 1437   ALT 31 02/17/2022 1437   ALKPHOS 89 02/17/2022 1437   BILITOT 1.1 02/17/2022 1437   GFRNONAA >60 07/11/2020 1708   GFRNONAA 84  12/14/2017 0945   GFRAA 98 12/14/2017 0945    Lab Results  Component Value Date   TSH 1.07 03/09/2017       ASSESSMENT AND PLAN  Multiple sclerosis, relapsing-remitting (HCC)  High risk medication use  Urinary urgency  Attention deficit disorder (ADD) without hyperactivity  Migraine without aura and without status migrainosus, not intractable   She cut the second year of Mavenclad a little short skipping the last 2 days.  That should be okay as she got the entire first week in.  We will check blood work today.  She is very claustrophobic and has had conscious sedation for MRIs in the past.  We had difficulty setting this up locally (the hospital will only do general anesthesia with intubation) she reports that this was done at [facility] in the past so we will try to schedule MRIs there.  She has had no recent exacerbation or progression.   We discussedactive SPMS and expectations of medicaitons (prevent new lesions).  Unfortunately medications are not too effective against the slow progression and this may still be seen. Stay active and exercise as tolerated. Rtc 4 months or sooner if new or worsening issues  40-minute office visit with the majority of the time spent face-to-face for history and physical, discussion/counseling and decision-making.  Additional time with record review and documentation.     Angelina Venard A. Felecia Shelling, MD, [facility] 77/[id number]/9688, 6:48 AM Certified in Neurology, Clinical Neurophysiology, Sleep Medicine and Neuroimaging  [facility] [address], [place], [place] [address] [phone]

## 2022-08-12 LAB — COMPREHENSIVE METABOLIC PANEL
ALT: 25 IU/L (ref 0–32)
AST: 20 IU/L (ref 0–40)
Albumin/Globulin Ratio: 1.5 (ref 1.2–2.2)
Albumin: 4.2 g/dL (ref 3.8–4.9)
Alkaline Phosphatase: 93 IU/L (ref 44–121)
BUN/Creatinine Ratio: 21 (ref 12–28)
BUN: 15 mg/dL (ref 8–27)
Bilirubin Total: 0.9 mg/dL (ref 0.0–1.2)
CO2: 26 mmol/L (ref 20–29)
Calcium: 9.6 mg/dL (ref 8.7–10.3)
Chloride: 104 mmol/L (ref 96–106)
Creatinine, Ser: 0.73 mg/dL (ref 0.57–1.00)
Globulin, Total: 2.8 g/dL (ref 1.5–4.5)
Glucose: 97 mg/dL (ref 70–99)
Potassium: 3.4 mmol/L — ABNORMAL LOW (ref 3.5–5.2)
Sodium: 144 mmol/L (ref 134–144)
Total Protein: 7 g/dL (ref 6.0–8.5)
eGFR: 94 mL/min/{1.73_m2} (ref >59)

## 2022-08-12 LAB — CBC WITH DIFFERENTIAL/PLATELET
Basophils Absolute: 0 10*3/uL (ref 0.0–0.2)
Basos: 1 %
EOS (ABSOLUTE): 0.2 10*3/uL (ref 0.0–0.4)
Eos: 5 %
Hematocrit: 39.5 % (ref 34.0–46.6)
Hemoglobin: 13.2 g/dL (ref 11.1–15.9)
Immature Grans (Abs): 0 10*3/uL (ref 0.0–0.1)
Immature Granulocytes: 0 %
Lymphocytes Absolute: 0.3 10*3/uL — ABNORMAL LOW (ref 0.7–3.1)
Lymphs: 7 %
MCH: 31.4 pg (ref 26.6–33.0)
MCHC: 33.4 g/dL (ref 31.5–35.7)
MCV: 94 fL (ref 79–97)
Monocytes Absolute: 0.3 10*3/uL (ref 0.1–0.9)
Monocytes: 8 %
Neutrophils Absolute: 3.2 10*3/uL (ref 1.4–7.0)
Neutrophils: 79 %
Platelets: 188 10*3/uL (ref 150–450)
RBC: 4.21 x10E6/uL (ref 3.77–5.28)
RDW: 13.2 % (ref 11.7–15.4)
WBC: 4 10*3/uL (ref 3.4–10.8)

## 2022-08-12 LAB — VITAMIN D 25 HYDROXY (VIT D DEFICIENCY, FRACTURES): Vit D, 25-Hydroxy: 42.4 ng/mL (ref 30.0–100.0)

## 2022-08-17 ENCOUNTER — Encounter: Payer: Self-pay | Admitting: Oncology

## 2022-08-17 ENCOUNTER — Telehealth: Payer: Self-pay | Admitting: Neurology

## 2022-08-17 NOTE — Telephone Encounter (Signed)
HTA NPR sent to [facility] [phone]

## 2022-08-25 NOTE — Progress Notes (Deleted)
Cardiology Office Note:    Date:  08/25/2022   ID:  Cynthia Nichols, DOB Sep 11, 1961, MRN [id number]  PCP:  Cynthia Hipps, MD   [facility] Providers Cardiologist:  None   Referring MD: Cynthia Hipps, MD    History of Present Illness:    Cynthia Nichols is a 61 y.o. female with a hx of HTN, HLD, MS, and palpitations who presents to clinic for follow-up. Previously followed with Dr. Bettina Nichols.  Per review of the record, the patient saw Dr. Bettina Nichols in 2021 for palpitations. Cardiac monitor at that time with occasional SVE (2.4%) but no arrhythmias. She was recommended to resume her beta blocker.  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   Essential hypertension, benign 11/29/2017   Fracture of fifth metatarsal bone 08/13/2015   Greater trochanteric bursitis, left 12/31/2019   Injection given December 31, 2019   Headache(784.0)    Humerus fracture    right with radial nerve palsy   Hypertension    IBS (irritable bowel syndrome)    Migraine without aura and without status migrainosus, not intractable 02/09/2018   MS (multiple sclerosis) ([place])    Multiple sclerosis, relapsing-remitting ([place]) 12/14/2011   Osteoporosis 11/29/2017   Other fracture of shaft of right humerus, initial encounter for closed fracture 05/13/2017   Patella fracture 07/03/2015   Hairline oblique     Patellar subluxation, left, initial encounter 08/14/2018   PONV (postoperative nausea and vomiting)    only on this surgery   Radial nerve palsy, 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   Right-sided     Unspecified venous (peripheral) insufficiency 11/09/2011   Venous aneurysm    Right side of neck    Past Surgical History:  Procedure Laterality Date   ABDOMINAL HYSTERECTOMY     BREAST MASS EXCISION     Bilateral breast mass excision ( Benign)   ORIF HUMERUS FRACTURE Right 05/17/2017   RADIOLOGY WITH ANESTHESIA N/A 11/03/2021   Procedure: MRI WITH BRAIN WITH AND WITHOUT,CERVICAL WITH AND WITHOUT CONTRAST;  Surgeon: Radiologist, Medication, MD;  Location: [facility];  Service: Radiology;  Laterality: N/A;   TENNIS ELBOW RELEASE/NIRSCHEL PROCEDURE Right 05/17/2017   Procedure: EXPLORATION AND EXPOSURE OF RADIAL NERVE RIGHT ARM;  Surgeon: Cynthia Kaufman, MD;  Location: [facility];  Service: Orthopedics;  Laterality: Right;    Current Medications: No outpatient medications have been marked as taking for the 08/27/22 encounter (Appointment) with Freada Bergeron, MD.     Allergies:   Ampyra [dalfampridine], Imitrex [sumatriptan], Crestor [rosuvastatin calcium], Peanut-containing drug products, and Shrimp [shellfish allergy]   Social History   Socioeconomic History   Marital status: Married    Spouse name: Ramiah Helfrich   Number of children: 3   Years of education: Not on file   Highest education level: Associate degree: occupational, Hotel manager, or vocational program  Occupational History   Occupation: disabled    Comment: Therapist, sports  Tobacco Use   Smoking status: Never   Smokeless tobacco: Never  Vaping Use   Vaping Use: Never used  Substance and Sexual Activity   Alcohol use: No    Alcohol/week: 0.0 standard drinks of alcohol   Drug use: No   Sexual activity: Yes    Partners: Male  Other Topics Concern   Not on file  Social History Narrative   Not on file   Social Determinants of Health   Financial Resource Strain: Low Risk  (02/08/2019)   Overall Financial Resource Strain (CARDIA)    Difficulty of Paying Living Expenses: Not hard at all  Food Insecurity: No Food Insecurity  (02/08/2019)   Hunger Vital Sign    Worried About Running Out of Food in the Last Year: Never true    [place] in the Last Year: Never true  Transportation Needs: No Transportation Needs (02/08/2019)   PRAPARE - Hydrologist (Medical): No    Lack of Transportation (Non-Medical): No  Physical Activity: Sufficiently Active (02/08/2019)   Exercise Vital Sign    Days of Exercise per Week: 7 days    Minutes of Exercise per Session: 30 min  Stress: No Stress Concern Present (02/08/2019)   [place]    Feeling of Stress : Not at all  Social Connections: [place] (02/08/2019)   Social Connection and Isolation Panel [NHANES]    Frequency of Communication with Friends and Family: More than three times a week    Frequency of Social Gatherings with Friends and Family: Once a week    Attends Religious Services: More than 4 times per year    Active Member of [employer] or Organizations: Yes    Attends Music therapist: More than 4 times per year    Marital Status: Married     Family History: The patient's ***family history is not on file. She was adopted.  ROS:   Please see the history of present illness.    *** All other systems reviewed and are negative.  EKGs/Labs/Other Studies Reviewed:    The following studies were reviewed today: ***  EKG:  EKG is *** ordered today.  The ekg ordered today demonstrates ***  Recent Labs: 08/11/2022: ALT 25; BUN 15; Creatinine, Ser 0.73; Hemoglobin 13.2; Platelets 188; Potassium 3.4; Sodium 144  Recent Lipid Panel No results found for: "CHOL", "TRIG", "HDL", "CHOLHDL", "VLDL", "LDLCALC", "LDLDIRECT"   Risk Assessment/Calculations:   {Does this patient have ATRIAL FIBRILLATION?:[id number]}  No BP recorded.  {Refresh Note OR Click here to enter BP  :1}***         Physical Exam:    VS:  There were no vitals taken for this visit.     Wt Readings from Last 3 Encounters:  08/11/22 151 lb 8 oz (68.7 kg)  04/14/22 154 lb (69.9 kg)  02/17/22 159 lb (72.1 kg)     GEN: *** Well nourished, well developed in no acute distress HEENT: Normal NECK: No JVD; No carotid bruits LYMPHATICS: No lymphadenopathy CARDIAC: ***RRR, no murmurs, rubs, gallops RESPIRATORY:  Clear to auscultation without rales, wheezing or rhonchi  ABDOMEN: Soft, non-tender, non-distended MUSCULOSKELETAL:  No edema; No deformity  SKIN: Warm and dry NEUROLOGIC:  Alert and oriented x 3 PSYCHIATRIC:  Normal affect   ASSESSMENT:    No diagnosis found. PLAN:    In order of problems listed above:  #Palpitations: Cardiac monitor in 2021 with occasional SVE with 2.4% burden. Currently, *** -Continue bioprolol prn for palps  #HLD: Has not tolerated crestor in the past.  -Continue pravastatin '20mg'$   daiy  #HTN: -Continue candesartan '8mg'$  daily      {Are you ordering a CV Procedure (e.g. stress test, cath, DCCV, TEE, etc)?   Press F2        :[id number]}    Medication Adjustments/Labs and Tests Ordered: Current medicines are reviewed at length with the patient today.  Concerns regarding medicines are outlined above.  No orders of the defined types were placed in this encounter.  No orders of the defined types were placed in this encounter.   There are no Patient Instructions on file for this visit.   Signed, Freada Bergeron, MD  08/25/2022 10:34 AM    [place]

## 2022-08-27 ENCOUNTER — Ambulatory Visit: Payer: PPO | Admitting: Cardiology

## 2022-09-07 ENCOUNTER — Other Ambulatory Visit: Payer: Self-pay

## 2022-09-09 ENCOUNTER — Other Ambulatory Visit (HOSPITAL_COMMUNITY): Payer: Self-pay

## 2022-09-09 NOTE — Progress Notes (Deleted)
Cynthia Nichols [address] Phone: [phone] Subjective:    I'm seeing this patient by the request  of:  Cynthia Hipps, MD  CC:   [id number]  06/10/2022 Patient has a hairline on blade that seems to be healing at this time.  I believe the patient will continue to improve.  Discussed vitamin supplementation.  Has had difficulty with this previously on the right side.  I do think patient will continue to improve.  Follow-up again in 6 to 8 weeks otherwise.      Update 09/17/2022 Cynthia Nichols is a 61 y.o. female coming in with complaint of R knee pain. 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   Essential hypertension, benign 11/29/2017   Fracture of fifth metatarsal bone 08/13/2015   Greater trochanteric bursitis, left 12/31/2019   Injection given December 31, 2019   Headache(784.0)    Humerus fracture    right with radial nerve palsy   Hypertension    IBS (irritable bowel syndrome)    Migraine without aura and without status migrainosus, not intractable 02/09/2018   MS (multiple sclerosis) ([place])    Multiple sclerosis, relapsing-remitting ([place]) 12/14/2011   Osteoporosis 11/29/2017   Other fracture of shaft of right humerus, initial encounter for closed fracture 05/13/2017   Patella fracture 07/03/2015   Hairline oblique     Patellar subluxation, left, initial encounter 08/14/2018   PONV (postoperative nausea and vomiting)    only on this surgery   Radial nerve palsy, 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   SI (sacroiliac) joint dysfunction  10/23/2015   Right-sided    Unspecified venous (peripheral) insufficiency 11/09/2011   Venous aneurysm    Right side of neck   Past Surgical History:  Procedure Laterality Date   ABDOMINAL HYSTERECTOMY     BREAST MASS EXCISION     Bilateral breast mass excision ( Benign)   ORIF HUMERUS FRACTURE Right 05/17/2017   RADIOLOGY WITH ANESTHESIA N/A 11/03/2021   Procedure: MRI WITH BRAIN WITH AND WITHOUT,CERVICAL WITH AND WITHOUT CONTRAST;  Surgeon: Radiologist, Medication, MD;  Location: [facility];  Service: Radiology;  Laterality: N/A;   TENNIS ELBOW RELEASE/NIRSCHEL PROCEDURE Right 05/17/2017   Procedure: EXPLORATION AND EXPOSURE OF RADIAL NERVE RIGHT ARM;  Surgeon: Roseanne Kaufman, MD;  Location: [facility];  Service: Orthopedics;  Laterality: Right;   Social History   Socioeconomic History   Marital status: Married    Spouse name: Cynthia Nichols   Number of children: 3   Years of education: Not on file   Highest education level: Associate degree: occupational, Hotel manager, or vocational program  Occupational History   Occupation: disabled    Comment: Therapist, sports  Tobacco Use   Smoking status: Never   Smokeless tobacco: Never  Vaping Use   Vaping Use: Never used  Substance and Sexual Activity   Alcohol use: No    Alcohol/week: 0.0 standard drinks of alcohol   Drug use: No   Sexual activity: Yes    Partners: Male  Other Topics Concern  Not on file  Social History Narrative   Not on file   Social Determinants of Health   Financial Resource Strain: Low Risk  (02/08/2019)   Overall Financial Resource Strain (CARDIA)    Difficulty of Paying Living Expenses: Not hard at all  Food Insecurity: No Food Insecurity (02/08/2019)   Hunger Vital Sign    Worried About Running Out of Food in the Last Year: Never true    Ran Out of Food in the Last Year: Never true  Transportation Needs: No Transportation Needs (02/08/2019)   PRAPARE - Hydrologist (Medical): No    Lack of  Transportation (Non-Medical): No  Physical Activity: Sufficiently Active (02/08/2019)   Exercise Vital Sign    Days of Exercise per Week: 7 days    Minutes of Exercise per Session: 30 min  Stress: No Stress Concern Present (02/08/2019)   [place]    Feeling of Stress : Not at all  Social Connections: [place] (02/08/2019)   Social Connection and Isolation Panel [NHANES]    Frequency of Communication with Friends and Family: More than three times a week    Frequency of Social Gatherings with Friends and Family: Once a week    Attends Religious Services: More than 4 times per year    Active Member of [employer] or Organizations: Yes    Attends Music therapist: More than 4 times per year    Marital Status: Married   Allergies  Allergen Reactions   Ampyra [Dalfampridine] Anaphylaxis and Shortness Of Breath    Chest pain, also    Imitrex [Sumatriptan] Other (See Comments)    Chest tightness   Crestor [Rosuvastatin Calcium] Other (See Comments)    Abdominal pain, shortness of breath   Peanut-Containing Drug Products Itching    Itching in the mouth (remarked that she still eats this in limited amounts)   Shrimp [Shellfish Allergy] Itching    Itching in the mouth (remarked that she still eats this in limited amounts)   Family History  Adopted: Yes     Current Outpatient Medications (Cardiovascular):    bisoprolol (ZEBETA) 5 MG tablet, Take 1/2 to 1 tablet (2.5-5 mg total) by mouth daily for elevated blood pressure   bisoprolol (ZEBETA) 5 MG tablet, Take 0.5-1 tablets (2.5-5 mg total) by mouth daily for elevated blood pressure   bisoprolol (ZEBETA) 5 MG tablet, Take 0.5-1 tablets (2.5-5 mg total) by mouth daily for elevated blood pressure   candesartan (ATACAND) 8 MG tablet, Take 1 tablet (8 mg total) by mouth daily. (Patient taking differently: Take 4 mg by mouth daily as needed (Blood pressure).)    hydrochlorothiazide (MICROZIDE) 12.5 MG capsule, Take 1-2 capsules (12.5-25 mg total) by mouth daily for blood pressure   pravastatin (PRAVACHOL) 20 MG tablet, TAKE 1 TABLET BY MOUTH DAILY (Patient taking differently: Take 20 mg by mouth at bedtime.)   pravastatin (PRAVACHOL) 20 MG tablet, Take 1 tablet (20 mg total) by mouth daily.  Current Outpatient Medications (Respiratory):    albuterol (PROVENTIL) (2.5 MG/3ML) 0.083% nebulizer solution, USE 1 VIAL VIA NEBULIZER EVERY 4 TO 6 HOURS AS NEEDED FOR SHORTNESS OF BREATH Ellie Lunch  Current Outpatient Medications (Analgesics):    naratriptan (AMERGE) 2.5 MG tablet, Take 1 tablet (2.5 mg total) by mouth as directed for Migraine. May take a second dose after 4 hours if needed.   Current Outpatient Medications (Other):    amphetamine-dextroamphetamine (ADDERALL) 10 MG  tablet, Take 1 tablet (10 mg total) by mouth 2 (two) times daily.   buPROPion (WELLBUTRIN) 100 MG tablet, Take 1 tablet (100 mg total) by mouth daily with breakfast.   Cholecalciferol (VITAMIN D) 50 MCG (2000 UT) CAPS, Take 5,000 Units by mouth every 30 (thirty) days.   Cladribine, 8 Tabs, (MAVENCLAD, 8 TABS,) 10 MG TBPK, Take by mouth See admin instructions. Take once a year will start in July  Month one: Day 1-3: 2 tabs/day, Day 4 and 5: 1 tab/day Month two: Day 1 and 2: 2 tabs/day, Days 3-5: 1 tab/day   doxycycline (VIBRA-TABS) 100 MG tablet, Take 1 tablet (100 mg total) by mouth 2 (two) times daily. (Patient not taking: Reported on 08/11/2022)   ergocalciferol (VITAMIN D2) 1.25 MG (50000 UT) capsule, Take 1 capsule (50,000 Units total) by mouth once a week. (Patient not taking: Reported on 08/11/2022)   gabapentin (NEURONTIN) 600 MG tablet, Take 1 tablet (600 mg total) by mouth at bedtime.   omeprazole (PRILOSEC) 20 MG capsule, Take 1 capsule (20 mg total) by mouth daily 30 minutes before morning meal   potassium chloride (MICRO-K) 10 MEQ CR capsule, Take 1 capsule (10 mEq total) by  mouth daily.   potassium chloride SA (KLOR-CON M20) 20 MEQ tablet, Take 1 tablet (20 mEq total) by mouth 2 (two) times daily with food for 7 days for hypokalemia, THEN 1 tablet (20 mEq total) daily. (Patient taking differently: Take 10 meq daily)   zolpidem (AMBIEN CR) 6.25 MG CR tablet, Take 1 tablet by mouth at bedtime   zonisamide (ZONEGRAN) 100 MG capsule, Take 1 capsule (100 mg total) by mouth at bedtime (Patient not taking: Reported on 08/11/2022)   Reviewed prior external information including notes and imaging from  primary care provider As well as notes that were available from care everywhere and other healthcare systems.  Past medical history, social, surgical and family history all reviewed in electronic medical record.  No pertanent information unless stated regarding to the chief complaint.   Review of Systems:  No headache, visual changes, nausea, vomiting, diarrhea, constipation, dizziness, abdominal pain, skin rash, fevers, chills, night sweats, weight loss, swollen lymph nodes, body aches, joint swelling, chest pain, shortness of breath, mood changes. POSITIVE muscle aches  Objective  There were no vitals taken for this visit.   General: No apparent distress alert and oriented x3 mood and affect normal, dressed appropriately.  HEENT: Pupils equal, extraocular movements intact  Respiratory: Patient's speak in full sentences and does not appear short of breath  Cardiovascular: No lower extremity edema, non tender, no erythema      Impression and Recommendations:

## 2022-09-17 ENCOUNTER — Ambulatory Visit: Payer: PPO | Admitting: Family Medicine

## 2022-09-24 ENCOUNTER — Ambulatory Visit: Payer: PPO | Admitting: Cardiology

## 2022-09-30 ENCOUNTER — Other Ambulatory Visit (HOSPITAL_COMMUNITY): Payer: Self-pay

## 2022-09-30 MED ORDER — CANDESARTAN CILEXETIL 8 MG PO TABS
8.0000 mg | ORAL_TABLET | Freq: Every day | ORAL | 1 refills | Status: AC | PRN
  Filled 2022-09-30: qty 90, 90d supply, fill #0

## 2022-10-01 ENCOUNTER — Other Ambulatory Visit: Payer: Self-pay

## 2022-10-11 ENCOUNTER — Other Ambulatory Visit (HOSPITAL_COMMUNITY): Payer: Self-pay

## 2022-10-11 NOTE — Progress Notes (Deleted)
Cardiology Office Note:    Date:  10/11/2022   ID:  Cynthia Nichols, DOB 10/05/1960, MRN [id number]  PCP:  Ronita Hipps, MD   [facility] Providers Cardiologist:  None   Referring MD: Ronita Hipps, MD    History of Present Illness:    Cynthia Nichols is a 62 y.o. female with a hx of aortic atherosclerosis, HTN, HLD, multiple sclerosis, IBS, asthma and palpitations who was previously followed by Dr. Bettina Gavia who now presents to clinic for follow-up.  Was last seen by Dr. Bettina Gavia in 2021 for palpitations. Cardiac monitor 2021 with NSR, occasional PACs (2.4%), no arrhythmias.   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   Essential hypertension, benign 11/29/2017   Fracture of fifth metatarsal bone 08/13/2015   Greater trochanteric bursitis, left 12/31/2019   Injection given December 31, 2019   Headache(784.0)    Humerus fracture    right with radial nerve palsy   Hypertension    IBS (irritable bowel syndrome)    Migraine without aura and without status migrainosus, not intractable 02/09/2018   MS (multiple sclerosis) ([place])    Multiple sclerosis, relapsing-remitting ([place]) 12/14/2011   Osteoporosis 11/29/2017   Other fracture of shaft of right humerus, initial encounter for closed fracture 05/13/2017   Patella fracture 07/03/2015   Hairline oblique     Patellar subluxation, left, initial encounter 08/14/2018   PONV (postoperative nausea and vomiting)    only on this surgery   Radial nerve palsy, 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   Right-sided    Unspecified  venous (peripheral) insufficiency 11/09/2011   Venous aneurysm    Right side of neck    Past Surgical History:  Procedure Laterality Date   ABDOMINAL HYSTERECTOMY     BREAST MASS EXCISION     Bilateral breast mass excision ( Benign)   ORIF HUMERUS FRACTURE Right 05/17/2017   RADIOLOGY WITH ANESTHESIA N/A 11/03/2021   Procedure: MRI WITH BRAIN WITH AND WITHOUT,CERVICAL WITH AND WITHOUT CONTRAST;  Surgeon: Radiologist, Medication, MD;  Location: [facility];  Service: Radiology;  Laterality: N/A;   TENNIS ELBOW RELEASE/NIRSCHEL PROCEDURE Right 05/17/2017   Procedure: EXPLORATION AND EXPOSURE OF RADIAL NERVE RIGHT ARM;  Surgeon: Roseanne Kaufman, MD;  Location: [facility];  Service: Orthopedics;  Laterality: Right;    Current Medications: No outpatient medications have been marked as taking for the 10/19/22 encounter (Appointment) with Freada Bergeron, MD.     Allergies:   Ampyra [dalfampridine], Imitrex [sumatriptan], Crestor [rosuvastatin calcium], Peanut-containing drug products, and Shrimp [shellfish allergy]   Social History   Socioeconomic History   Marital status: Married    Spouse name: Cynthia Nichols   Number of children: 3   Years of education: Not on file   Highest education level: Associate degree: occupational, Hotel manager, or vocational program  Occupational History   Occupation: disabled    Comment: Therapist, sports  Tobacco Use   Smoking status: Never   Smokeless tobacco: Never  Vaping Use   Vaping Use: Never used  Substance and Sexual Activity  Alcohol use: No    Alcohol/week: 0.0 standard drinks of alcohol   Drug use: No   Sexual activity: Yes    Partners: Male  Other Topics Concern   Not on file  Social History Narrative   Not on file   Social Determinants of Health   Financial Resource Strain: Low Risk  (02/08/2019)   Overall Financial Resource Strain (CARDIA)    Difficulty of Paying Living Expenses: Not hard at all  Food Insecurity: No Food Insecurity (02/08/2019)   Hunger  Vital Sign    Worried About Running Out of Food in the Last Year: Never true    [place] in the Last Year: Never true  Transportation Needs: No Transportation Needs (02/08/2019)   PRAPARE - Hydrologist (Medical): No    Lack of Transportation (Non-Medical): No  Physical Activity: Sufficiently Active (02/08/2019)   Exercise Vital Sign    Days of Exercise per Week: 7 days    Minutes of Exercise per Session: 30 min  Stress: No Stress Concern Present (02/08/2019)   [place]    Feeling of Stress : Not at all  Social Connections: [place] (02/08/2019)   Social Connection and Isolation Panel [NHANES]    Frequency of Communication with Friends and Family: More than three times a week    Frequency of Social Gatherings with Friends and Family: Once a week    Attends Religious Services: More than 4 times per year    Active Member of [employer] or Organizations: Yes    Attends Music therapist: More than 4 times per year    Marital Status: Married     Family History: The patient's ***family history is not on file. She was adopted.  ROS:   Please see the history of present illness.    *** All other systems reviewed and are negative.  EKGs/Labs/Other Studies Reviewed:    The following studies were reviewed today: Cardiac monitor 2021 with NSR, occasional PACs (2.4%), no arrhythmias.  EKG:  EKG is *** ordered today.  The ekg ordered today demonstrates ***  Recent Labs: 08/11/2022: ALT 25; BUN 15; Creatinine, Ser 0.73; Hemoglobin 13.2; Platelets 188; Potassium 3.4; Sodium 144  Recent Lipid Panel No results found for: "CHOL", "TRIG", "HDL", "CHOLHDL", "VLDL", "LDLCALC", "LDLDIRECT"   Risk Assessment/Calculations:   {Does this patient have ATRIAL FIBRILLATION?[phone]}  No BP recorded.  {Refresh Note OR Click here to enter BP  :1}***         Physical Exam:    VS:   There were no vitals taken for this visit.    Wt Readings from Last 3 Encounters:  08/11/22 151 lb 8 oz (68.7 kg)  04/14/22 154 lb (69.9 kg)  02/17/22 159 lb (72.1 kg)     GEN: *** Well nourished, well developed in no acute distress HEENT: Normal NECK: No JVD; No carotid bruits LYMPHATICS: No lymphadenopathy CARDIAC: ***RRR, no murmurs, rubs, gallops RESPIRATORY:  Clear to auscultation without rales, wheezing or rhonchi  ABDOMEN: Soft, non-tender, non-distended MUSCULOSKELETAL:  No edema; No deformity  SKIN: Warm and dry NEUROLOGIC:  Alert and oriented x 3 PSYCHIATRIC:  Normal affect   ASSESSMENT:    No diagnosis found. PLAN:    In order of problems listed above:  #Palpitations: Cardiac monitor in 2021 with 2.4% burden PACs but no significant arrhythmias. Currently, *** -Continue bisoprolol as needed  #HTN: -Continue candesartan '8mg'$  daily -Continue HCTZ 12.'5mg'$   daily  #HLD: -Continue pravastatin '20mg'$  daily  #MS: #Aortic Atherosclerosis: Follows at [facility] you ordering a CV Procedure (e.g. stress test, cath, DCCV, TEE, etc)?   Press F2        :[id number]    Medication Adjustments/Labs and Tests Ordered: Current medicines are reviewed at length with the patient today.  Concerns regarding medicines are outlined above.  No orders of the defined types were placed in this encounter.  No orders of the defined types were placed in this encounter.   There are no Patient Instructions on file for this visit.   Signed, Freada Bergeron, MD  10/11/2022 7:34 PM    [place]

## 2022-10-12 ENCOUNTER — Other Ambulatory Visit (HOSPITAL_COMMUNITY): Payer: Self-pay

## 2022-10-13 ENCOUNTER — Other Ambulatory Visit (HOSPITAL_COMMUNITY): Payer: Self-pay

## 2022-10-13 MED ORDER — ZOLPIDEM TARTRATE ER 6.25 MG PO TBCR
6.2500 mg | EXTENDED_RELEASE_TABLET | Freq: Every day | ORAL | 3 refills | Status: DC
  Filled 2022-10-13: qty 30, 30d supply, fill #0
  Filled 2022-11-07 – 2022-11-10 (×2): qty 30, 30d supply, fill #1
  Filled 2022-12-09: qty 30, 30d supply, fill #2
  Filled 2023-01-10: qty 30, 30d supply, fill #3

## 2022-10-19 ENCOUNTER — Ambulatory Visit: Payer: PPO | Admitting: Cardiology

## 2022-11-08 ENCOUNTER — Other Ambulatory Visit (HOSPITAL_COMMUNITY): Payer: Self-pay

## 2022-11-08 ENCOUNTER — Other Ambulatory Visit: Payer: Self-pay

## 2022-11-10 ENCOUNTER — Other Ambulatory Visit (HOSPITAL_COMMUNITY): Payer: Self-pay

## 2022-11-19 ENCOUNTER — Other Ambulatory Visit (HOSPITAL_COMMUNITY): Payer: Self-pay

## 2022-11-19 MED ORDER — HYDROCHLOROTHIAZIDE 12.5 MG PO CAPS
12.5000 mg | ORAL_CAPSULE | Freq: Every day | ORAL | 3 refills | Status: DC
  Filled 2022-11-19: qty 60, 30d supply, fill #0
  Filled 2023-01-10: qty 60, 30d supply, fill #1
  Filled 2023-03-20: qty 60, 30d supply, fill #2
  Filled 2023-05-06: qty 60, 30d supply, fill #3

## 2022-11-29 ENCOUNTER — Ambulatory Visit: Payer: PPO | Admitting: Neurology

## 2022-11-29 ENCOUNTER — Other Ambulatory Visit (HOSPITAL_COMMUNITY): Payer: Self-pay

## 2022-11-29 ENCOUNTER — Other Ambulatory Visit: Payer: Self-pay

## 2022-11-29 ENCOUNTER — Encounter: Payer: Self-pay | Admitting: Neurology

## 2022-11-29 VITALS — BP 119/73 | HR 72 | Ht 67.0 in | Wt 158.0 lb

## 2022-11-29 DIAGNOSIS — G43009 Migraine without aura, not intractable, without status migrainosus: Secondary | ICD-10-CM | POA: Diagnosis not present

## 2022-11-29 DIAGNOSIS — Z79899 Other long term (current) drug therapy: Secondary | ICD-10-CM | POA: Diagnosis not present

## 2022-11-29 DIAGNOSIS — G35 Multiple sclerosis: Secondary | ICD-10-CM

## 2022-11-29 DIAGNOSIS — R269 Unspecified abnormalities of gait and mobility: Secondary | ICD-10-CM

## 2022-11-29 DIAGNOSIS — E559 Vitamin D deficiency, unspecified: Secondary | ICD-10-CM | POA: Diagnosis not present

## 2022-11-29 DIAGNOSIS — G35A Relapsing-remitting multiple sclerosis: Secondary | ICD-10-CM

## 2022-11-29 DIAGNOSIS — F988 Other specified behavioral and emotional disorders with onset usually occurring in childhood and adolescence: Secondary | ICD-10-CM

## 2022-11-29 DIAGNOSIS — R3915 Urgency of urination: Secondary | ICD-10-CM

## 2022-11-29 MED ORDER — GABAPENTIN 300 MG PO CAPS
300.0000 mg | ORAL_CAPSULE | Freq: Three times a day (TID) | ORAL | 11 refills | Status: DC
  Filled 2022-11-29: qty 90, 30d supply, fill #0
  Filled 2023-03-10: qty 90, 30d supply, fill #1
  Filled 2023-04-08: qty 90, 30d supply, fill #2
  Filled 2023-06-22: qty 90, 30d supply, fill #3

## 2022-11-29 MED ORDER — HYDROXYZINE PAMOATE 25 MG PO CAPS
25.0000 mg | ORAL_CAPSULE | Freq: Two times a day (BID) | ORAL | 5 refills | Status: AC
  Filled 2022-11-29: qty 60, 30d supply, fill #0
  Filled 2023-06-22: qty 60, 30d supply, fill #1

## 2022-11-29 MED ORDER — AMPHETAMINE-DEXTROAMPHETAMINE 10 MG PO TABS
10.0000 mg | ORAL_TABLET | Freq: Two times a day (BID) | ORAL | 0 refills | Status: DC
  Filled 2022-11-29: qty 60, 30d supply, fill #0

## 2022-11-29 MED ORDER — NARATRIPTAN HCL 2.5 MG PO TABS
ORAL_TABLET | ORAL | 11 refills | Status: DC
  Filled 2022-11-29: qty 9, 30d supply, fill #0

## 2022-11-29 NOTE — Progress Notes (Signed)
[facility]  PATIENT: Cynthia Nichols DOB: 24-Nov-1960  REFERRING DOCTOR OR PCP: Darlin Priestly, MD ([place]); Clydie Braun, FNP (PCP) SOURCE: Patient, notes from neurology, imaging and lab reports.  _________________________________   HISTORICAL  CHIEF COMPLAINT:  Chief Complaint  Patient presents with   Follow-up    RM 11, alone. Last seen 08/11/22. Ambulates with cane. Has had 1 fall since last seen, no injuries. Denies any new sx.    HISTORY OF PRESENT ILLNESS:  Cynthia Nichols is a 62 y.o. woman with multiple sclerosis.  She had no other exacerbations.     Update 11/29/2022: Cynthia Nichols was initiated late June 2022 and second month 04/12/21 - 04/16/21.    She felt very tired and achy the second week but was fine a few days later.      Lymphocytes were 0.6 in 05/2021 and 0.8 10/07/2020 and 0.9 02/17/2022   She never got the MRI last year we ordered (very claustophobic)  She did the second year of treatment 05/30/22-06/03/2022 and October.    She felt fine with the first week, except a HA.  However, she had a more severe HA and GI issues.   The lymphocytes  were 0.3 08/11/2022.   Gait , balance and strength are stable.    Some stumbles but only rare fall   Right leg is weaker than left.  She uses her right AFO for long walks only and a cane outside her home.   She can walk a mile without a break in 25 minutes.       She has some spasticity in her right leg.   She has numbness and tingling in her feet.  She has itching  She has urge incontinence / nocturia.   Oxybutynin was poorly tolerated.   She would prefer not to take another pill (we had discussed Myrbetriq).   She has 2x nocturia.    She has fatigue helped a bit by Adderall.  She sleeps fairly well on Ambien CR 6.25 mg nightly (6-8 hours).  .Leg cramps are better with potassium.    She has anxiety > depression.   She notes has cognitive issue.  Word finding is difficult at times and mild difficulties with STM.    She has trouble with parallel processing.     Potassium has been low so she takes a supplement.   She is on HCTZ.    She has LBP, worse with prolonged standing.    MRI lumbar in 2018 was normal by report.   She broke her right humerus in a fall in 2018.   Also has had right ankle fracture.    She has migraine headaches are doinw ell and she stopped zonisamide and Emgality,   She has non-migraine headaches more than migraine  MS History:  She had the onset of right leg weakness one day in 2011.  Symptoms did not improve.  However, she began to have bladder issues a few years earlier.    She initially was seen at [facility].  MRI of the cervical spine 11/12/2009 showed only the one spot at [facility].   She had EP studies (normal) and CSF analysis which was reporgtedly c/w MS.   She was placed on Copaxone.  She had episodes of diplopia which were fleeting.   She was switched to Tecfidera bit had trouble tolerating it.   After several years, she switched to Tysabri.   She felt great after the first dose but felt poorly after  the second dose so she stopped.   About 2 years ago, she started Mayzent but stopped after 3 months due to feeling poorly.   Though no relapses after she stopped, gait has slowly worsened.  Mavenclad was initiated in June 022.      Imaging reports (actual images have been requested but were not available at the time of the visit): MRI of the brain report 06/14/2019 "No significant change nonenhancing white matter foci compatible with multiple sclerosis. No new lesion"  MRI of the cervical spine 06/14/2019 report "T2 signal: Dorsal lateral right at C1 stable. Right ventrolateral. C4 Which may or may not have been present previously. Bilateral ventral cord left greater than right as seen previously adjacent to extrusion as per below, question right progression versus artifact... Right lateral C6 stable.. Large longitudinal focus left  T2 stable on sagittal images.    Degenerative change with a  large disc osteophyte complex at C5-C6 to the right with moderate bilateral foraminal narrowing and moderate spinal stenosis.  milder problems at other levels.   REVIEW OF SYSTEMS: Constitutional: No fevers, chills, sweats, or change in appetite Eyes: No visual changes, double vision, eye pain Ear, nose and throat: No hearing loss, ear pain, nasal congestion, sore throat Cardiovascular: No chest pain, palpitations Respiratory:  No shortness of breath at rest or with exertion.   No wheezes GastrointestinaI: No nausea, vomiting, diarrhea, abdominal pain, fecal incontinence Genitourinary:  No dysuria, urinary retention or frequency.  No nocturia. Musculoskeletal:  No neck pain, back pain Integumentary: No rash, pruritus, skin lesions Neurological: as above Psychiatric: No depression at this time.  No anxiety Endocrine: No palpitations, diaphoresis, change in appetite, change in weigh or increased thirst Hematologic/Lymphatic:  No anemia, purpura, petechiae. Allergic/Immunologic: No itchy/runny eyes, nasal congestion, recent allergic reactions, rashes  ALLERGIES: Allergies  Allergen Reactions   Ampyra [Dalfampridine] Anaphylaxis and Shortness Of Breath    Chest pain, also    Imitrex [Sumatriptan] Other (See Comments)    Chest tightness   Crestor [Rosuvastatin Calcium] Other (See Comments)    Abdominal pain, shortness of breath   Peanut-Containing Drug Products Itching    Itching in the mouth (remarked that she still eats this in limited amounts)   Shrimp [Shellfish Allergy] Itching    Itching in the mouth (remarked that she still eats this in limited amounts)    HOME MEDICATIONS:  Current Outpatient Medications:    amphetamine-dextroamphetamine (ADDERALL) 10 MG tablet, Take 1 tablet (10 mg total) by mouth 2 (two) times daily., Disp: 60 tablet, Rfl: 0   bisoprolol (ZEBETA) 5 MG tablet, Take 1/2 to 1 tablet (2.5-5 mg total) by mouth daily for elevated blood pressure, Disp: 30 tablet,  Rfl: 2   bisoprolol (ZEBETA) 5 MG tablet, Take 0.5-1 tablets (2.5-5 mg total) by mouth daily for elevated blood pressure, Disp: 30 tablet, Rfl: 2   bisoprolol (ZEBETA) 5 MG tablet, Take 0.5-1 tablets (2.5-5 mg total) by mouth daily for elevated blood pressure, Disp: 90 tablet, Rfl: 3   buPROPion (WELLBUTRIN) 100 MG tablet, Take 1 tablet (100 mg total) by mouth daily with breakfast., Disp: 90 tablet, Rfl: 3   candesartan (ATACAND) 8 MG tablet, Take 1 tablet (8 mg total) by mouth daily. (Patient taking differently: Take 4 mg by mouth daily as needed (Blood pressure).), Disp: 90 tablet, Rfl: 1   candesartan (ATACAND) 8 MG tablet, Take 1 tablet (8 mg total) by mouth daily as needed for blood pressure., Disp: 90 tablet, Rfl: 1  Cholecalciferol (VITAMIN D) 50 MCG (2000 UT) CAPS, Take 5,000 Units by mouth every 30 (thirty) days., Disp: , Rfl:    Cladribine, 8 Tabs, (MAVENCLAD, 8 TABS,) 10 MG TBPK, Take by mouth See admin instructions. Take once a year will start in July  Month one: Day 1-3: 2 tabs/day, Day 4 and 5: 1 tab/day Month two: Day 1 and 2: 2 tabs/day, Days 3-5: 1 tab/day, Disp: , Rfl:    gabapentin (NEURONTIN) 600 MG tablet, Take 1 tablet (600 mg total) by mouth at bedtime., Disp: 90 tablet, Rfl: 3   hydrochlorothiazide (MICROZIDE) 12.5 MG capsule, Take 1-2 capsules (12.5-25 mg total) by mouth daily for blood pressure., Disp: 60 capsule, Rfl: 3   naratriptan (AMERGE) 2.5 MG tablet, Take 1 tablet (2.5 mg total) by mouth as directed for Migraine. May take a second dose after 4 hours if needed., Disp: 10 tablet, Rfl: 11   omeprazole (PRILOSEC) 20 MG capsule, Take 1 capsule (20 mg total) by mouth daily 30 minutes before morning meal, Disp: 30 capsule, Rfl: 1   pravastatin (PRAVACHOL) 20 MG tablet, Take 1 tablet (20 mg total) by mouth daily., Disp: 90 tablet, Rfl: 1   zolpidem (AMBIEN CR) 6.25 MG CR tablet, Take 1 tablet by mouth at bedtime, Disp: 30 tablet, Rfl: 3   zonisamide (ZONEGRAN) 100 MG capsule,  Take 1 capsule (100 mg total) by mouth at bedtime, Disp: 90 capsule, Rfl: 3   albuterol (PROVENTIL) (2.5 MG/3ML) 0.083% nebulizer solution, USE 1 VIAL VIA NEBULIZER EVERY 4 TO 6 HOURS AS NEEDED FOR SHORTNESS OF BREATH Ellie Lunch, Disp: 225 mL, Rfl: 0   pravastatin (PRAVACHOL) 20 MG tablet, TAKE 1 TABLET BY MOUTH DAILY (Patient taking differently: Take 20 mg by mouth at bedtime.), Disp: 30 tablet, Rfl: 6  PAST MEDICAL HISTORY: 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   Aortic atherosclerosis ([address]) 11/29/2017   Asthma    Asthma 11/02/2019   Closed fracture of shaft of right humerus 05/13/2017   Closed low lateral malleolus fracture, left, initial encounter 10/17/2018   COVID 07/2021   mild case   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   Essential hypertension, benign 11/29/2017   Fracture of fifth metatarsal bone 08/13/2015   Greater trochanteric bursitis, left 12/31/2019   Injection given December 31, 2019   Headache(784.0)    Humerus fracture    right with radial nerve palsy   Hypertension    IBS (irritable bowel syndrome)    Migraine without aura and without status migrainosus, not intractable 02/09/2018   MS (multiple sclerosis) ([place])    Multiple sclerosis, relapsing-remitting ([place]) 12/14/2011   Osteoporosis 11/29/2017   Other fracture of shaft of right humerus, initial encounter for closed fracture 05/13/2017   Patella fracture 07/03/2015   Hairline oblique     Patellar subluxation, left, initial encounter 08/14/2018   PONV (postoperative nausea and vomiting)    only on this surgery   Radial nerve palsy, 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   Right-sided    Unspecified venous (peripheral) insufficiency 11/09/2011   Venous aneurysm    Right side of neck    PAST SURGICAL HISTORY: Past Surgical History:  Procedure Laterality Date   ABDOMINAL  HYSTERECTOMY     BREAST MASS EXCISION     Bilateral breast mass excision ( Benign)   ORIF HUMERUS FRACTURE Right 05/17/2017   RADIOLOGY  WITH ANESTHESIA N/A 11/03/2021   Procedure: MRI WITH BRAIN WITH AND WITHOUT,CERVICAL WITH AND WITHOUT CONTRAST;  Surgeon: Radiologist, Medication, MD;  Location: [facility];  Service: Radiology;  Laterality: N/A;   TENNIS ELBOW RELEASE/NIRSCHEL PROCEDURE Right 05/17/2017   Procedure: EXPLORATION AND EXPOSURE OF RADIAL NERVE RIGHT ARM;  Surgeon: Roseanne Kaufman, MD;  Location: [facility];  Service: Orthopedics;  Laterality: Right;    FAMILY HISTORY: Family History  Adopted: Yes    SOCIAL HISTORY:  Social History   Socioeconomic History   Marital status: Married    Spouse name: Everett Asaro   Number of children: 3   Years of education: Not on file   Highest education level: Associate degree: occupational, Hotel manager, or vocational program  Occupational History   Occupation: disabled    Comment: Therapist, sports  Tobacco Use   Smoking status: Never   Smokeless tobacco: Never  Vaping Use   Vaping Use: Never used  Substance and Sexual Activity   Alcohol use: No    Alcohol/week: 0.0 standard drinks of alcohol   Drug use: No   Sexual activity: Yes    Partners: Male  Other Topics Concern   Not on file  Social History Narrative   Not on file   Social Determinants of Health   Financial Resource Strain: Low Risk  (02/08/2019)   Overall Financial Resource Strain (CARDIA)    Difficulty of Paying Living Expenses: Not hard at all  Food Insecurity: No Food Insecurity (02/08/2019)   Hunger Vital Sign    Worried About Running Out of Food in the Last Year: Never true    [place] in the Last Year: Never true  Transportation Needs: No Transportation Needs (02/08/2019)   PRAPARE - Hydrologist (Medical): No    Lack of Transportation (Non-Medical): No  Physical Activity: Sufficiently Active (02/08/2019)   Exercise Vital Sign    Days of Exercise  per Week: 7 days    Minutes of Exercise per Session: 30 min  Stress: No Stress Concern Present (02/08/2019)   [place]    Feeling of Stress : Not at all  Social Connections: [place] (02/08/2019)   Social Connection and Isolation Panel [NHANES]    Frequency of Communication with Friends and Family: More than three times a week    Frequency of Social Gatherings with Friends and Family: Once a week    Attends Religious Services: More than 4 times per year    Active Member of [employer] or Organizations: Yes    Attends Archivist Meetings: More than 4 times per year    Marital Status: Married  Human resources officer Violence: Not At Risk (02/08/2019)   Humiliation, Afraid, Rape, and Kick questionnaire    Fear of Current or Ex-Partner: No    Emotionally Abused: No    Physically Abused: No    Sexually Abused: No     PHYSICAL EXAM  Vitals:   11/29/22 1317  BP: 119/73  Pulse: 72  Weight: 158 lb (71.7 kg)  Height: '5\' 7"'$  (1.702 m)    Body mass index is 24.75 kg/m.   General: The patient is well-developed and well-nourished and in no acute distress  HEENT:  Head is [place]/AT.  Sclera are anicteric.    Neck: the neck is nontender.  Skin: Extremities are without rash or  edema.  Musculoskeletal:  Back is nontender  Neurologic Exam  Mental status: The patient is alert and  oriented x 3 at the time of the examination. The patient has apparent normal recent and remote memory, with an apparently normal attention span and concentration ability.   Speech is normal.  Cranial nerves: Extraocular movements are full.  Facial strength appeared normal.  No dysarthria.  No obvious hearing deficits are noted.  Motor:  Muscle bulk is normal.   Tone is increased in the right leg.   Strength is 3/5 right hip flexors, 4+/5 quads, 4/5 hamstrings and 2+/5 toe/ankle extension.   Strength is  5 / 5 elsewhere.   Sensory: Sensory  testing is intact to pinprick, soft touch and vibration sensation in all 4 extremities.  Coordination: Cerebellar testing reveals good finger-nose-finger and reduced heel to shin worse on right  Gait and station: Station is normal.   She has a wide based gait with a right foot drop.   She cannot do a tandem walk.  The Romberg is negative.  Reflexes: Deep tendon reflexes are symmetric and normal in arms, slightly increased right leg.       DIAGNOSTIC DATA (LABS, IMAGING, TESTING) - I reviewed patient records, labs, notes, testing and imaging myself where available.  Lab Results  Component Value Date   WBC 4.0 08/11/2022   HGB 13.2 08/11/2022   HCT 39.5 08/11/2022   MCV 94 08/11/2022   PLT 188 08/11/2022      Component Value Date/Time   NA 144 08/11/2022 0853   K 3.4 (L) 08/11/2022 0853   CL 104 08/11/2022 0853   CO2 26 08/11/2022 0853   GLUCOSE 97 08/11/2022 0853   GLUCOSE 120 (H) 07/11/2020 1708   BUN 15 08/11/2022 0853   CREATININE 0.73 08/11/2022 0853   CREATININE 0.59 02/09/2018 0930   CALCIUM 9.6 08/11/2022 0853   PROT 7.0 08/11/2022 0853   ALBUMIN 4.2 08/11/2022 0853   AST 20 08/11/2022 0853   ALT 25 08/11/2022 0853   ALKPHOS 93 08/11/2022 0853   BILITOT 0.9 08/11/2022 0853   GFRNONAA >60 07/11/2020 1708   GFRNONAA 84 12/14/2017 0945   GFRAA 98 12/14/2017 0945    Lab Results  Component Value Date   TSH 1.07 03/09/2017       ASSESSMENT AND PLAN  Multiple sclerosis, relapsing-remitting (HCC)  High risk medication use  Attention deficit disorder (ADD) without hyperactivity  Migraine without aura and without status migrainosus, not intractable  Urinary urgency  Vitamin D deficiency  Gait disturbance   Has completed Mavenclad .  We will check labs.   Will need [facility] at [facility].   She has had no recent exacerbation or progression.   We discussedactive SPMS and expectations of medicaitons (prevent new lesions).  Unfortunately medications are  not too effective against the slow progression and this may still be seen.   She feels stable compared t last year. For migraines will take Amerge (if not covered by insurance, change to Relpax 20 mg). Stay active and exercise as tolerated. Rtc 6 months or sooner if new or worsening issues    Mayci Haning A. Felecia Shelling, MD, [facility] 0000000, 0000000 PM Certified in Neurology, Clinical Neurophysiology, Sleep Medicine and Neuroimaging  [facility] [address] [place], [place] [address] [phone]

## 2022-11-30 LAB — COMPREHENSIVE METABOLIC PANEL
ALT: 32 IU/L (ref 0–32)
AST: 27 IU/L (ref 0–40)
Albumin/Globulin Ratio: 1.7 (ref 1.2–2.2)
Albumin: 4.4 g/dL (ref 3.8–4.9)
Alkaline Phosphatase: 97 IU/L (ref 44–121)
BUN/Creatinine Ratio: 17 (ref 12–28)
BUN: 13 mg/dL (ref 8–27)
Bilirubin Total: 0.8 mg/dL (ref 0.0–1.2)
CO2: 26 mmol/L (ref 20–29)
Calcium: 9.7 mg/dL (ref 8.7–10.3)
Chloride: 102 mmol/L (ref 96–106)
Creatinine, Ser: 0.78 mg/dL (ref 0.57–1.00)
Globulin, Total: 2.6 g/dL (ref 1.5–4.5)
Glucose: 90 mg/dL (ref 70–99)
Potassium: 3.6 mmol/L (ref 3.5–5.2)
Sodium: 144 mmol/L (ref 134–144)
Total Protein: 7 g/dL (ref 6.0–8.5)
eGFR: 87 mL/min/{1.73_m2} (ref >59)

## 2022-11-30 LAB — CBC WITH DIFFERENTIAL/PLATELET
Basophils Absolute: 0 10*3/uL (ref 0.0–0.2)
Basos: 1 %
EOS (ABSOLUTE): 0.1 10*3/uL (ref 0.0–0.4)
Eos: 3 %
Hematocrit: 39.5 % (ref 34.0–46.6)
Hemoglobin: 13.5 g/dL (ref 11.1–15.9)
Immature Grans (Abs): 0 10*3/uL (ref 0.0–0.1)
Immature Granulocytes: 1 %
Lymphocytes Absolute: 0.7 10*3/uL (ref 0.7–3.1)
Lymphs: 16 %
MCH: 31.3 pg (ref 26.6–33.0)
MCHC: 34.2 g/dL (ref 31.5–35.7)
MCV: 92 fL (ref 79–97)
Monocytes Absolute: 0.3 10*3/uL (ref 0.1–0.9)
Monocytes: 7 %
Neutrophils Absolute: 3 10*3/uL (ref 1.4–7.0)
Neutrophils: 72 %
Platelets: 194 10*3/uL (ref 150–450)
RBC: 4.31 x10E6/uL (ref 3.77–5.28)
RDW: 12 % (ref 11.7–15.4)
WBC: 4.1 10*3/uL (ref 3.4–10.8)

## 2022-12-10 ENCOUNTER — Other Ambulatory Visit (HOSPITAL_COMMUNITY): Payer: Self-pay

## 2022-12-10 ENCOUNTER — Other Ambulatory Visit: Payer: Self-pay

## 2023-01-04 ENCOUNTER — Ambulatory Visit: Payer: PPO | Admitting: Neurology

## 2023-01-04 NOTE — Progress Notes (Deleted)
  Cardiology Office Note:   Date:  01/04/2023  ID:  Drenda Freeze, DOB [birth year], MRN [id number]  History of Present Illness:   Cynthia Nichols is a 62 y.o. female aortic atherosclerosis, migraines, IBS, HTN and asthma who was previously followed by Dr. Dulce Sellar who now presents to clinic for follow-up.  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 Has long-standing history of palpitations and HTN and has been on chronic beta blockers. Prior zio monitor with no significant arrhythmias or significant ectopy.  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   Essential hypertension, benign 11/29/2017   Fracture of fifth metatarsal bone 08/13/2015   Greater trochanteric bursitis, left 12/31/2019   Injection given December 31, 2019   Headache(784.0)    Humerus fracture    right with radial nerve palsy   Hypertension    IBS (irritable bowel syndrome)    Migraine without aura and without status migrainosus, not intractable 02/09/2018   MS (multiple sclerosis) (HCC)    Multiple sclerosis, relapsing-remitting (HCC) 12/14/2011   Osteoporosis 11/29/2017   Other fracture of shaft of right humerus, initial encounter for closed fracture 05/13/2017   Patella fracture 07/03/2015   Hairline oblique     Patellar subluxation, left, initial encounter 08/14/2018   PONV (postoperative nausea and vomiting)    only on this surgery   Radial nerve palsy, 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   Right-sided    Unspecified venous (peripheral) insufficiency 11/09/2011   Venous  aneurysm    Right side of neck     ROS: ***  Studies Reviewed:    EKG:  ***  Cardiac Studies & Procedures         MONITORS  LONG TERM MONITOR (3-14 DAYS) 07/04/2020            Risk Assessment/Calculations:   {Does this patient have ATRIAL FIBRILLATION?:[id number]} No BP recorded.  {Refresh Note OR Click here to enter BP  :1}***        Physical Exam:   VS:  There were no vitals taken for this visit.   Wt Readings from Last 3 Encounters:  11/29/22 158 lb (71.7 kg)  08/11/22 151 lb 8 oz (68.7 kg)  04/14/22 154 lb (69.9 kg)     GEN: Well nourished, well developed in no acute distress NECK: No JVD; No carotid bruits CARDIAC: ***RRR, no murmurs, rubs, gallops RESPIRATORY:  Clear to auscultation without rales, wheezing or rhonchi  ABDOMEN: Soft, non-tender, non-distended EXTREMITIES:  No edema; No deformity   ASSESSMENT AND PLAN:   #Palpitations: -Cardiac monitor with no sustained arrhythmias or significant ectopy -Continue bisoprolol 2.5mg  daily  #HTN: -Continue bisoprolol 2.5mg  daily -Continue HCTZ 12.5mg  daily -Continue candesartan 8mg  daily  #HLD: -Continue pravastatin 20mg  daily -Check Ca score     {Are you ordering a CV Procedure (e.g. stress test, cath, DCCV, TEE, etc)?   Press F2        :[id number]}   Signed, Meriam Sprague, MD

## 2023-01-07 ENCOUNTER — Ambulatory Visit: Payer: PPO | Attending: Cardiology | Admitting: Cardiology

## 2023-01-10 ENCOUNTER — Other Ambulatory Visit: Payer: Self-pay | Admitting: Neurology

## 2023-01-10 ENCOUNTER — Other Ambulatory Visit (HOSPITAL_COMMUNITY): Payer: Self-pay

## 2023-01-10 MED ORDER — PRAVASTATIN SODIUM 20 MG PO TABS
20.0000 mg | ORAL_TABLET | Freq: Every day | ORAL | 0 refills | Status: DC
  Filled 2023-01-10: qty 30, 30d supply, fill #0

## 2023-01-10 MED ORDER — AMPHETAMINE-DEXTROAMPHETAMINE 10 MG PO TABS
10.0000 mg | ORAL_TABLET | Freq: Two times a day (BID) | ORAL | 0 refills | Status: DC
  Filled 2023-01-10: qty 60, 30d supply, fill #0

## 2023-01-11 ENCOUNTER — Other Ambulatory Visit: Payer: Self-pay

## 2023-01-11 ENCOUNTER — Other Ambulatory Visit (HOSPITAL_COMMUNITY): Payer: Self-pay

## 2023-01-31 NOTE — Progress Notes (Unsigned)
Cardiology Office Note:   Date:  02/03/2023  ID:  Cynthia Nichols, DOB 01/03/1961, MRN [id number]  History of Present Illness:   Cynthia Nichols is a 62 y.o. female aortic atherosclerosis, migraines, IBS, HTN and asthma who was previously followed by Dr. Dulce Sellar who now presents to clinic for follow-up.  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 Has long-standing history of palpitations and HTN and has been on chronic beta blockers. Prior zio monitor with no significant arrhythmias or significant ectopy.  Today, the patient feels okay. States her blood pressure has ben all over the place at home. BP have been running anywhere from 110-150s. Notably, she only takes candesartan as needed and she has not taken her meds consistently at the same time in the morning. When her blood pressure is either high or low, she feels unwell/malaise. Otherwise, no chest pain, SOB, orthopnea or PND. Has occasional palpitations which can last up to before resolving. No known triggers and symptoms worse at night. She is amenable to changing bisoprolol to BID dosing to see if this helps.  Remains active and walks at least 1 mile daily without anginal symptoms.    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   Essential hypertension, benign 11/29/2017   Fracture of fifth metatarsal bone 08/13/2015   Greater trochanteric bursitis, left 12/31/2019   Injection given December 31, 2019   Headache(784.0)    Humerus fracture    right with radial nerve palsy   Hypertension    IBS (irritable bowel syndrome)    Migraine  without aura and without status migrainosus, not intractable 02/09/2018   MS (multiple sclerosis) (HCC)    Multiple sclerosis, relapsing-remitting (HCC) 12/14/2011   Osteoporosis 11/29/2017   Other fracture of shaft of right humerus, initial encounter for closed fracture 05/13/2017   Patella fracture 07/03/2015   Hairline oblique     Patellar subluxation, left, initial encounter 08/14/2018   PONV (postoperative nausea and vomiting)    only on this surgery   Radial nerve palsy, 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   Right-sided    Unspecified venous (peripheral) insufficiency 11/09/2011   Venous aneurysm    Right side of neck     ROS: As per HPI  Studies Reviewed:    EKG:  No new tracing today  Cardiac Studies & Procedures         MONITORS  LONG TERM MONITOR (3-14 DAYS) 07/04/2020            Risk Assessment/Calculations:              Physical Exam:   VS:  BP 114/70   Pulse 75   Ht 5\' 7"  (1.702 m)   Wt 153 lb 6.4 oz (69.6 kg)   SpO2 99%   BMI 24.03 kg/m    Wt Readings from Last 3 Encounters:  02/03/23 153 lb 6.4 oz (69.6 kg)  11/29/22 158 lb (71.7 kg)  08/11/22 151 lb 8 oz (68.7 kg)     GEN: Well nourished, well developed in no acute  distress NECK: No JVD; No carotid bruits CARDIAC: RRR, no murmurs, rubs, gallops RESPIRATORY:  Clear to auscultation without rales, wheezing or rhonchi  ABDOMEN: Soft, non-tender, non-distended EXTREMITIES:  No edema; No deformity   ASSESSMENT AND PLAN:   #Palpitations: -Cardiac monitor with no sustained arrhythmias or significant ectopy -Change bisoprolol to 2.5mg  BID to help suppress nocturnal palpitations  #HTN: -Change bisoprolol 2.5mg  BID -Continue HCTZ 12.5mg  daily -Continue candesartan 8mg  daily as needed -Keep BP log for [place] to review  #HLD: -Continue pravastatin 20mg  daily -Check lipid panel -Check Ca score        Signed, Meriam Sprague, MD

## 2023-02-03 ENCOUNTER — Encounter: Payer: Self-pay | Admitting: Cardiology

## 2023-02-03 ENCOUNTER — Other Ambulatory Visit (HOSPITAL_COMMUNITY): Payer: Self-pay

## 2023-02-03 ENCOUNTER — Ambulatory Visit: Payer: PPO | Attending: Cardiology | Admitting: Cardiology

## 2023-02-03 VITALS — BP 114/70 | HR 75 | Ht 67.0 in | Wt 153.4 lb

## 2023-02-03 DIAGNOSIS — E7800 Pure hypercholesterolemia, unspecified: Secondary | ICD-10-CM

## 2023-02-03 DIAGNOSIS — Z79899 Other long term (current) drug therapy: Secondary | ICD-10-CM

## 2023-02-03 DIAGNOSIS — I10 Essential (primary) hypertension: Secondary | ICD-10-CM

## 2023-02-03 DIAGNOSIS — R002 Palpitations: Secondary | ICD-10-CM

## 2023-02-03 MED ORDER — BISOPROLOL FUMARATE 5 MG PO TABS
2.5000 mg | ORAL_TABLET | Freq: Two times a day (BID) | ORAL | 2 refills | Status: AC
Start: 2023-02-03 — End: ?
  Filled 2023-02-03: qty 90, 90d supply, fill #0
  Filled 2023-05-06: qty 90, 90d supply, fill #1
  Filled 2023-08-07: qty 90, 90d supply, fill #2

## 2023-02-03 NOTE — Patient Instructions (Signed)
Medication Instructions:   START TAKING BISOPROLOL 2.5 MG BY MOUTH TWICE DAILY  *If you need a refill on your cardiac medications before your next appointment, please call your pharmacy*   Lab Work:  NEXT WEEK HERE IN THE OFFICE--LIPIDS--PLEASE COME FASTING  If you have labs (blood work) drawn today and your tests are completely normal, you will receive your results only by: MyChart Message (if you have MyChart) OR A paper copy in the mail If you have any lab test that is abnormal or we need to change your treatment, we will call you to review the results.   Testing/Procedures:  CARDIAC CALCIUM SCORE (SELF PAY)   Follow-Up: At [facility], you and your health needs are our priority.  As part of our continuing mission to provide you with exceptional heart care, we have created designated Provider Care Teams.  These Care Teams include your primary Cardiologist (physician) and Advanced Practice Providers (APPs -  Physician Assistants and Nurse Practitioners) who all work together to provide you with the care you need, when you need it.  We recommend signing up for the patient portal called "MyChart".  Sign up information is provided on this After Visit Summary.  MyChart is used to connect with patients for Virtual Visits (Telemedicine).  Patients are able to view lab/test results, encounter notes, upcoming appointments, etc.  Non-urgent messages can be sent to your provider as well.   To learn more about what you can do with MyChart, go to [email or web address].    Your next appointment:   1 year(s)  Provider:   DR. Shari Prows

## 2023-02-08 ENCOUNTER — Ambulatory Visit: Payer: PPO | Attending: Cardiology

## 2023-02-08 ENCOUNTER — Other Ambulatory Visit (HOSPITAL_COMMUNITY): Payer: Self-pay

## 2023-02-08 DIAGNOSIS — I10 Essential (primary) hypertension: Secondary | ICD-10-CM | POA: Diagnosis not present

## 2023-02-08 DIAGNOSIS — Z79899 Other long term (current) drug therapy: Secondary | ICD-10-CM | POA: Diagnosis not present

## 2023-02-08 DIAGNOSIS — E7800 Pure hypercholesterolemia, unspecified: Secondary | ICD-10-CM

## 2023-02-08 DIAGNOSIS — R002 Palpitations: Secondary | ICD-10-CM | POA: Diagnosis not present

## 2023-02-08 LAB — LIPID PANEL
Chol/HDL Ratio: 2.8 ratio (ref 0.0–4.4)
Cholesterol, Total: 193 mg/dL (ref 100–199)
HDL: 68 mg/dL (ref >39)
LDL Chol Calc (NIH): 105 mg/dL — ABNORMAL HIGH (ref 0–99)
Triglycerides: 112 mg/dL (ref 0–149)
VLDL Cholesterol Cal: 20 mg/dL (ref 5–40)

## 2023-02-08 MED ORDER — ZOLPIDEM TARTRATE ER 6.25 MG PO TBCR
6.2500 mg | EXTENDED_RELEASE_TABLET | Freq: Every day | ORAL | 3 refills | Status: DC
  Filled 2023-02-08: qty 30, 30d supply, fill #0
  Filled 2023-03-10: qty 30, 30d supply, fill #1
  Filled 2023-04-08 – 2023-04-11 (×2): qty 30, 30d supply, fill #2
  Filled 2023-05-06: qty 30, 30d supply, fill #3

## 2023-02-08 MED ORDER — PRAVASTATIN SODIUM 20 MG PO TABS
20.0000 mg | ORAL_TABLET | Freq: Every day | ORAL | 2 refills | Status: DC
  Filled 2023-02-08: qty 30, 30d supply, fill #0
  Filled 2023-03-10: qty 30, 30d supply, fill #1
  Filled 2023-04-08: qty 30, 30d supply, fill #2

## 2023-02-15 ENCOUNTER — Ambulatory Visit (HOSPITAL_BASED_OUTPATIENT_CLINIC_OR_DEPARTMENT_OTHER)
Admission: RE | Admit: 2023-02-15 | Discharge: 2023-02-15 | Disposition: A | Discharge status: Home / Self Care | Payer: PPO | Source: Ambulatory Visit | Attending: Cardiology | Admitting: Cardiology

## 2023-02-15 DIAGNOSIS — E7800 Pure hypercholesterolemia, unspecified: Secondary | ICD-10-CM | POA: Insufficient documentation

## 2023-02-15 DIAGNOSIS — Z79899 Other long term (current) drug therapy: Secondary | ICD-10-CM | POA: Insufficient documentation

## 2023-03-10 ENCOUNTER — Other Ambulatory Visit (HOSPITAL_COMMUNITY): Payer: Self-pay

## 2023-03-10 ENCOUNTER — Other Ambulatory Visit: Payer: Self-pay | Admitting: Neurology

## 2023-03-10 ENCOUNTER — Other Ambulatory Visit: Payer: Self-pay

## 2023-03-10 MED ORDER — AMPHETAMINE-DEXTROAMPHETAMINE 10 MG PO TABS
10.0000 mg | ORAL_TABLET | Freq: Two times a day (BID) | ORAL | 0 refills | Status: DC
  Filled 2023-03-10: qty 60, 30d supply, fill #0

## 2023-03-10 NOTE — Telephone Encounter (Signed)
Last seen on 11/29/22 Follow up scheduled on 06/14/23 Last filled on 01/11/23 #60 tablets (30 day supply) Rx pending to be signed

## 2023-03-11 ENCOUNTER — Other Ambulatory Visit (HOSPITAL_COMMUNITY): Payer: Self-pay

## 2023-03-21 ENCOUNTER — Other Ambulatory Visit (HOSPITAL_COMMUNITY): Payer: Self-pay

## 2023-04-06 DIAGNOSIS — M4802 Spinal stenosis, cervical region: Secondary | ICD-10-CM | POA: Diagnosis not present

## 2023-04-06 DIAGNOSIS — G35 Multiple sclerosis: Secondary | ICD-10-CM | POA: Diagnosis not present

## 2023-04-06 DIAGNOSIS — R269 Unspecified abnormalities of gait and mobility: Secondary | ICD-10-CM | POA: Diagnosis not present

## 2023-04-06 DIAGNOSIS — M4312 Spondylolisthesis, cervical region: Secondary | ICD-10-CM | POA: Diagnosis not present

## 2023-04-06 DIAGNOSIS — F40240 Claustrophobia: Secondary | ICD-10-CM | POA: Diagnosis not present

## 2023-04-06 DIAGNOSIS — M50322 Other cervical disc degeneration at C5-C6 level: Secondary | ICD-10-CM | POA: Diagnosis not present

## 2023-04-07 ENCOUNTER — Telehealth: Payer: Self-pay | Admitting: *Deleted

## 2023-04-07 NOTE — Telephone Encounter (Signed)
Reports for MRI Brain and Cervical received from [facility].

## 2023-04-08 ENCOUNTER — Other Ambulatory Visit: Payer: Self-pay

## 2023-04-11 ENCOUNTER — Other Ambulatory Visit (HOSPITAL_COMMUNITY): Payer: Self-pay

## 2023-05-04 DIAGNOSIS — J40 Bronchitis, not specified as acute or chronic: Secondary | ICD-10-CM | POA: Diagnosis not present

## 2023-05-04 DIAGNOSIS — Z6823 Body mass index (BMI) 23.0-23.9, adult: Secondary | ICD-10-CM | POA: Diagnosis not present

## 2023-05-04 DIAGNOSIS — J329 Chronic sinusitis, unspecified: Secondary | ICD-10-CM | POA: Diagnosis not present

## 2023-05-06 ENCOUNTER — Other Ambulatory Visit (HOSPITAL_COMMUNITY): Payer: Self-pay

## 2023-05-06 ENCOUNTER — Other Ambulatory Visit: Payer: Self-pay

## 2023-05-06 MED ORDER — PRAVASTATIN SODIUM 20 MG PO TABS
20.0000 mg | ORAL_TABLET | Freq: Every day | ORAL | 2 refills | Status: DC
  Filled 2023-05-06: qty 30, 30d supply, fill #0
  Filled 2023-06-09: qty 30, 30d supply, fill #1
  Filled 2023-07-10: qty 30, 30d supply, fill #2

## 2023-05-08 ENCOUNTER — Telehealth: Payer: Self-pay | Admitting: Neurology

## 2023-05-08 NOTE — Telephone Encounter (Signed)
MRI of the brain 04/06/2023 ([facility]) showed multiple foci in the white matter consistent with chronic demyelination.  There were no new lesions compared to the MRI from 06/19/2019.  MRI of the cervical spine 04/06/2023 ([facility]) showed Redemonstrated multifocal white matter lesions within the cervical spine without enhancement to suggest active demyelination. No new lesions.     Redemonstrated moderate spinal canal stenosis at C5-C6 secondary to retrolisthesis and disc bulge/osteophyte complex. There is unchanged abnormal T2 signal within the anterior cord at this level that is favored to represent a chronic demyelinating plaque.   We requested the images a couple weeks ago for personal review but have not yet received them.  We will re- request.

## 2023-05-09 ENCOUNTER — Other Ambulatory Visit (HOSPITAL_COMMUNITY): Payer: Self-pay

## 2023-05-09 ENCOUNTER — Encounter (HOSPITAL_BASED_OUTPATIENT_CLINIC_OR_DEPARTMENT_OTHER): Payer: Self-pay

## 2023-05-09 ENCOUNTER — Other Ambulatory Visit: Payer: Self-pay

## 2023-05-09 ENCOUNTER — Ambulatory Visit (HOSPITAL_BASED_OUTPATIENT_CLINIC_OR_DEPARTMENT_OTHER): Payer: PPO | Admitting: Pulmonary Disease

## 2023-05-24 DIAGNOSIS — J329 Chronic sinusitis, unspecified: Secondary | ICD-10-CM | POA: Diagnosis not present

## 2023-05-24 DIAGNOSIS — Z6824 Body mass index (BMI) 24.0-24.9, adult: Secondary | ICD-10-CM | POA: Diagnosis not present

## 2023-05-24 DIAGNOSIS — J40 Bronchitis, not specified as acute or chronic: Secondary | ICD-10-CM | POA: Diagnosis not present

## 2023-06-09 ENCOUNTER — Other Ambulatory Visit (HOSPITAL_COMMUNITY): Payer: Self-pay

## 2023-06-09 MED ORDER — ZOLPIDEM TARTRATE ER 6.25 MG PO TBCR
6.2500 mg | EXTENDED_RELEASE_TABLET | Freq: Every day | ORAL | 3 refills | Status: AC
  Filled 2023-06-09: qty 30, 30d supply, fill #0
  Filled 2023-07-10: qty 30, 30d supply, fill #1
  Filled 2023-08-07: qty 30, 30d supply, fill #2
  Filled 2023-09-08: qty 30, 30d supply, fill #3

## 2023-06-14 ENCOUNTER — Ambulatory Visit: Payer: PPO | Admitting: Neurology

## 2023-06-22 ENCOUNTER — Other Ambulatory Visit (HOSPITAL_COMMUNITY): Payer: Self-pay

## 2023-06-22 ENCOUNTER — Other Ambulatory Visit: Payer: Self-pay

## 2023-06-22 ENCOUNTER — Other Ambulatory Visit: Payer: Self-pay | Admitting: Neurology

## 2023-06-22 MED ORDER — AMPHETAMINE-DEXTROAMPHETAMINE 10 MG PO TABS
10.0000 mg | ORAL_TABLET | Freq: Two times a day (BID) | ORAL | 0 refills | Status: DC
  Filled 2023-06-22: qty 60, 30d supply, fill #0

## 2023-06-22 NOTE — Telephone Encounter (Signed)
Last seen on 11/29/22 Follow up scheduled on 12/01/23 Last filled on 03/11/23 #30 tablets (30 day supply) Rx pending to be signed

## 2023-06-23 ENCOUNTER — Other Ambulatory Visit (HOSPITAL_COMMUNITY): Payer: Self-pay

## 2023-06-23 ENCOUNTER — Encounter: Payer: Self-pay | Admitting: Oncology

## 2023-06-23 ENCOUNTER — Other Ambulatory Visit: Payer: Self-pay

## 2023-06-23 MED ORDER — BISACODYL 5 MG PO TBEC
20.0000 mg | DELAYED_RELEASE_TABLET | Freq: Once | ORAL | 0 refills | Status: AC
  Filled 2023-06-23: qty 4, 1d supply, fill #0

## 2023-06-23 MED ORDER — NA SULFATE-K SULFATE-MG SULF 17.5-3.13-1.6 GM/177ML PO SOLN
354.0000 mL | Freq: Once | ORAL | 0 refills | Status: AC
  Filled 2023-06-23: qty 354, 1d supply, fill #0

## 2023-07-07 DIAGNOSIS — D120 Benign neoplasm of cecum: Secondary | ICD-10-CM | POA: Diagnosis not present

## 2023-07-07 DIAGNOSIS — Z09 Encounter for follow-up examination after completed treatment for conditions other than malignant neoplasm: Secondary | ICD-10-CM | POA: Diagnosis not present

## 2023-07-07 DIAGNOSIS — Z860100 Personal history of colon polyps, unspecified: Secondary | ICD-10-CM | POA: Diagnosis not present

## 2023-07-07 DIAGNOSIS — K5730 Diverticulosis of large intestine without perforation or abscess without bleeding: Secondary | ICD-10-CM | POA: Diagnosis not present

## 2023-07-11 ENCOUNTER — Other Ambulatory Visit: Payer: Self-pay

## 2023-07-11 ENCOUNTER — Other Ambulatory Visit (HOSPITAL_COMMUNITY): Payer: Self-pay

## 2023-07-11 MED ORDER — HYDROCHLOROTHIAZIDE 12.5 MG PO CAPS
12.5000 mg | ORAL_CAPSULE | Freq: Every day | ORAL | 0 refills | Status: DC
  Filled 2023-07-11: qty 60, 30d supply, fill #0

## 2023-07-20 ENCOUNTER — Other Ambulatory Visit (HOSPITAL_COMMUNITY): Payer: Self-pay

## 2023-07-20 DIAGNOSIS — I119 Hypertensive heart disease without heart failure: Secondary | ICD-10-CM | POA: Diagnosis not present

## 2023-07-20 DIAGNOSIS — Z0000 Encounter for general adult medical examination without abnormal findings: Secondary | ICD-10-CM | POA: Diagnosis not present

## 2023-07-20 DIAGNOSIS — E782 Mixed hyperlipidemia: Secondary | ICD-10-CM | POA: Diagnosis not present

## 2023-07-20 DIAGNOSIS — Z6823 Body mass index (BMI) 23.0-23.9, adult: Secondary | ICD-10-CM | POA: Diagnosis not present

## 2023-07-20 DIAGNOSIS — Z79899 Other long term (current) drug therapy: Secondary | ICD-10-CM | POA: Diagnosis not present

## 2023-07-20 DIAGNOSIS — G4700 Insomnia, unspecified: Secondary | ICD-10-CM | POA: Diagnosis not present

## 2023-07-20 DIAGNOSIS — Z1339 Encounter for screening examination for other mental health and behavioral disorders: Secondary | ICD-10-CM | POA: Diagnosis not present

## 2023-07-20 DIAGNOSIS — Z6824 Body mass index (BMI) 24.0-24.9, adult: Secondary | ICD-10-CM | POA: Diagnosis not present

## 2023-07-20 DIAGNOSIS — Z1331 Encounter for screening for depression: Secondary | ICD-10-CM | POA: Diagnosis not present

## 2023-07-20 DIAGNOSIS — H6090 Unspecified otitis externa, unspecified ear: Secondary | ICD-10-CM | POA: Diagnosis not present

## 2023-07-20 MED ORDER — HYDROCHLOROTHIAZIDE 12.5 MG PO CAPS
12.5000 mg | ORAL_CAPSULE | Freq: Every day | ORAL | 3 refills | Status: DC
  Filled 2023-09-08: qty 90, 90d supply, fill #0
  Filled 2023-12-13: qty 90, 90d supply, fill #1
  Filled 2024-03-14: qty 90, 90d supply, fill #2
  Filled 2024-06-13: qty 90, 90d supply, fill #3

## 2023-07-20 MED ORDER — ZOLPIDEM TARTRATE ER 6.25 MG PO TBCR
6.2500 mg | EXTENDED_RELEASE_TABLET | Freq: Every day | ORAL | 1 refills | Status: DC
  Filled 2023-10-07: qty 90, 90d supply, fill #0
  Filled 2024-01-05: qty 90, 90d supply, fill #1

## 2023-07-20 MED ORDER — PRAVASTATIN SODIUM 20 MG PO TABS
20.0000 mg | ORAL_TABLET | Freq: Every day | ORAL | 3 refills | Status: AC
  Filled 2023-08-07: qty 90, 90d supply, fill #0
  Filled 2023-11-04: qty 90, 90d supply, fill #1
  Filled 2024-01-20: qty 90, 90d supply, fill #2
  Filled 2024-05-11: qty 90, 90d supply, fill #3

## 2023-07-20 MED ORDER — CANDESARTAN CILEXETIL 8 MG PO TABS
8.0000 mg | ORAL_TABLET | Freq: Every day | ORAL | 1 refills | Status: AC | PRN
  Filled 2023-11-04 – 2024-04-17 (×2): qty 90, 90d supply, fill #0

## 2023-07-20 MED ORDER — BISOPROLOL FUMARATE 5 MG PO TABS
2.5000 mg | ORAL_TABLET | Freq: Every day | ORAL | 3 refills | Status: AC
  Filled 2023-07-20 – 2023-10-10 (×3): qty 90, 90d supply, fill #0
  Filled 2023-10-14: qty 55, 55d supply, fill #0
  Filled 2023-10-14: qty 35, 35d supply, fill #0
  Filled 2023-12-13 – 2024-02-05 (×3): qty 90, 90d supply, fill #1
  Filled 2024-04-17: qty 90, 90d supply, fill #2
  Filled 2024-07-03 (×2): qty 90, 90d supply, fill #3

## 2023-07-20 NOTE — Progress Notes (Unsigned)
[name] [address] [place] [address] Phone: [phone] Subjective:   [name], am serving as a scribe for Dr. Antoine Primas.  I'm seeing this patient by the request  of:  Marylen Ponto, MD  CC: Low back pain, ankle pain  [id number]  Cynthia Nichols is a 62 y.o. female coming in with complaint of LBP and L ankle pain. Last seen September 2023. Patient states L ankle pain for a couple of weeks. Sharp pain on the lateral side. Arnica has helped a little.     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   Essential hypertension, benign 11/29/2017   Fracture of fifth metatarsal bone 08/13/2015   Greater trochanteric bursitis, left 12/31/2019   Injection given December 31, 2019   Headache(784.0)    Humerus fracture    right with radial nerve palsy   Hypertension    IBS (irritable bowel syndrome)    Migraine without aura and without status migrainosus, not intractable 02/09/2018   MS (multiple sclerosis) (HCC)    Multiple sclerosis, relapsing-remitting (HCC) 12/14/2011   Osteoporosis 11/29/2017   Other fracture of shaft of right humerus, initial encounter for closed fracture 05/13/2017   Patella fracture 07/03/2015   Hairline oblique     Patellar subluxation, left, initial encounter 08/14/2018   PONV (postoperative nausea and vomiting)    only on this surgery   Radial nerve palsy, 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   Right-sided    Unspecified venous (peripheral) insufficiency 11/09/2011   Venous aneurysm    Right side of neck    Past Surgical History:  Procedure Laterality Date   ABDOMINAL HYSTERECTOMY     BREAST MASS EXCISION     Bilateral breast mass excision ( Benign)   ORIF HUMERUS FRACTURE Right 05/17/2017   RADIOLOGY WITH ANESTHESIA N/A 11/03/2021   Procedure: MRI WITH BRAIN WITH AND WITHOUT,CERVICAL WITH AND WITHOUT CONTRAST;  Surgeon: Radiologist, Medication, MD;  Location: [facility] OR;  Service: Radiology;  Laterality: N/A;   TENNIS ELBOW RELEASE/NIRSCHEL PROCEDURE Right 05/17/2017   Procedure: EXPLORATION AND EXPOSURE OF RADIAL NERVE RIGHT ARM;  Surgeon: Dominica Severin, MD;  Location: [facility] OR;  Service: Orthopedics;  Laterality: Right;   Social History   Socioeconomic History   Marital status: Married    Spouse name: Laylamarie Meskill   Number of children: 3   Years of education: Not on file   Highest education level: Associate degree: occupational, Scientist, product/process development, or vocational program  Occupational History   Occupation: disabled    Comment: Charity fundraiser  Tobacco Use   Smoking status: Never   Smokeless tobacco: Never  Vaping Use   Vaping status: Never Used  Substance and Sexual Activity   Alcohol use: No    Alcohol/week: 0.0 standard drinks of alcohol   Drug use: No   Sexual activity: Yes    Partners: Male  Other Topics Concern   Not on file  Social History Narrative   Not on file   Social Determinants of Health   Financial Resource Strain: Low Risk  (  02/08/2019)   Overall Financial Resource Strain (CARDIA)    Difficulty of Paying Living Expenses: Not hard at all  Food Insecurity: No Food Insecurity (02/08/2019)   Hunger Vital Sign    Worried About Running Out of Food in the Last Year: Never true    Ran Out of Food in the Last Year: Never true  Transportation Needs: No Transportation Needs (02/08/2019)   PRAPARE - Administrator, Civil Service (Medical): No    Lack of Transportation (Non-Medical): No  Physical Activity: Sufficiently Active (02/08/2019)   Exercise Vital Sign    Days of Exercise per  Week: 7 days    Minutes of Exercise per Session: 30 min  Stress: No Stress Concern Present (02/08/2019)   [employer] of Occupational Health - Occupational Stress Questionnaire    Feeling of Stress : Not at all  Social Connections: Socially Integrated (02/08/2019)   Social Connection and Isolation Panel [NHANES]    Frequency of Communication with Friends and Family: More than three times a week    Frequency of Social Gatherings with Friends and Family: Once a week    Attends Religious Services: More than 4 times per year    Active Member of [employer] or Organizations: Yes    Attends Engineer, structural: More than 4 times per year    Marital Status: Married   Allergies  Allergen Reactions   Ampyra [Dalfampridine] Anaphylaxis and Shortness Of Breath    Chest pain, also    Imitrex [Sumatriptan] Other (See Comments)    Chest tightness   Crestor [Rosuvastatin Calcium] Other (See Comments)    Abdominal pain, shortness of breath   Peanut-Containing Drug Products Itching    Itching in the mouth (remarked that she still eats this in limited amounts)   Shrimp [Shellfish Allergy] Itching    Itching in the mouth (remarked that she still eats this in limited amounts)   Family History  Adopted: Yes     Current Outpatient Medications (Cardiovascular):    bisoprolol (ZEBETA) 5 MG tablet, Take 1/2 tablet (2.5 mg total) by mouth in the morning and at bedtime.   bisoprolol (ZEBETA) 5 MG tablet, Take 1/2-1 tablet (2.5-5 mg total) by mouth daily for elevated blood pressure   candesartan (ATACAND) 8 MG tablet, Take 1 tablet (8 mg total) by mouth daily. (Patient taking differently: Take 4 mg by mouth daily as needed (Blood pressure).)   candesartan (ATACAND) 8 MG tablet, Take 1 tablet (8 mg total) by mouth daily as needed for blood pressure.   candesartan (ATACAND) 8 MG tablet, Take 1 tablet (8 mg total) by mouth daily as needed for blood pressure - rarely uses   hydrochlorothiazide  (MICROZIDE) 12.5 MG capsule, Take 1 capsule (12.5 mg total) by mouth daily for blood pressure   pravastatin (PRAVACHOL) 20 MG tablet, TAKE 1 TABLET BY MOUTH DAILY (Patient taking differently: Take 20 mg by mouth at bedtime.)   pravastatin (PRAVACHOL) 20 MG tablet, Take 1 tablet (20 mg total) by mouth daily.   pravastatin (PRAVACHOL) 20 MG tablet, Take 1 tablet (20 mg total) by mouth daily.  Current Outpatient Medications (Respiratory):    albuterol (PROVENTIL) (2.5 MG/3ML) 0.083% nebulizer solution, USE 1 VIAL VIA NEBULIZER EVERY 4 TO 6 HOURS AS NEEDED FOR SHORTNESS OF BREATH Sima Matas  Current Outpatient Medications (Analgesics):    naratriptan (AMERGE) 2.5 MG tablet, Take 1 tablet (2.5 mg total) by mouth as directed for Migraine. May take a second dose after 4 hours if  needed.   Current Outpatient Medications (Other):    amphetamine-dextroamphetamine (ADDERALL) 10 MG tablet, Take 1 tablet (10 mg total) by mouth 2 (two) times daily.   buPROPion (WELLBUTRIN) 100 MG tablet, Take 1 tablet (100 mg total) by mouth daily with breakfast.   Cholecalciferol (VITAMIN D) 50 MCG (2000 UT) CAPS, Take 5,000 Units by mouth every 30 (thirty) days.   gabapentin (NEURONTIN) 300 MG capsule, Take 1 capsule (300 mg total) by mouth 3 (three) times daily.   hydrOXYzine (VISTARIL) 25 MG capsule, Take 1 capsule (25 mg total) by mouth 1 - 2 (two) times daily as needed.   zolpidem (AMBIEN CR) 6.25 MG CR tablet, Take 1 tablet by mouth at bedtime   zolpidem (AMBIEN CR) 6.25 MG CR tablet, Take 1 tablet (6.25 mg total) by mouth at bedtime.   Reviewed prior external information including notes and imaging from  primary care provider As well as notes that were available from care everywhere and other healthcare systems.  Past medical history, social, surgical and family history all reviewed in electronic medical record.  No pertanent information unless stated regarding to the chief complaint.   Review of Systems:  No  headache, visual changes, nausea, vomiting, diarrhea, constipation, dizziness, abdominal pain, skin rash, fevers, chills, night sweats, weight loss, swollen lymph nodes joint swelling, chest pain, shortness of breath, mood changes. POSITIVE muscle aches, body aches  Objective  Blood pressure 118/74, pulse 71, height 5\' 7"  (1.702 m), weight 157 lb (71.2 kg), SpO2 98%.   General: No apparent distress alert and oriented x3 mood and affect normal, dressed appropriately.  HEENT: Pupils equal, extraocular movements intact  Respiratory: Patient's speak in full sentences and does not appear short of breath  Cardiovascular: No lower extremity edema, non tender, no erythema  Antalgic gait using the aid of a cane.  Patient does have some limited range of motion especially with dorsiflexion of the ankle.  Does seem to be neurovascularly intact.  Patient tender to palpation over the lateral malleolus and just posterior to the area.  No significant crepitus.  Negative anterior drawer test.  Limited muscular skeletal ultrasound was performed and interpreted by Antoine Primas, M   Limited ultrasound of patient's left ankle on the lateral aspect shows the patient does have some what appears to be a callus formation over the lateral malleolus.  Patient though does still have some hypoechoic changes around the ATFL as well as in the anterior lateral ankle mortise.  No cortical irregularity noted of the talar dome. Impression: Mild ankle effusion with likely moderate ankle sprain  20100; 15 additional minutes spent for Therapeutic exercises as stated in above notes.  This included exercises focusing on stretching, strengthening, with significant focus on eccentric aspects.   Long term goals include an improvement in range of motion, strength, endurance as well as avoiding reinjury. Patient's frequency would include in 1-2 times a day, 3-5 times a week for a duration of 6-12 weeks. Ankle strengthening that included:   Basic range of motion exercises to allow proper full motion at ankle Stretching of the lower leg and hamstrings  Theraband exercises for the lower leg - inversion, eversion, dorsiflexion and plantarflexion each to be completed with a theraband Balance exercises to increase proprioception Weight bearing exercises to increase strength and balance   Proper technique shown and discussed handout in great detail with ATC.  All questions were discussed and answered.      Impression and Recommendations:

## 2023-07-21 ENCOUNTER — Ambulatory Visit (INDEPENDENT_AMBULATORY_CARE_PROVIDER_SITE_OTHER): Payer: PPO

## 2023-07-21 ENCOUNTER — Encounter: Payer: Self-pay | Admitting: Family Medicine

## 2023-07-21 ENCOUNTER — Ambulatory Visit: Payer: PPO | Admitting: Family Medicine

## 2023-07-21 ENCOUNTER — Other Ambulatory Visit: Payer: Self-pay

## 2023-07-21 VITALS — BP 118/74 | HR 71 | Ht 67.0 in | Wt 157.0 lb

## 2023-07-21 DIAGNOSIS — S93402A Sprain of unspecified ligament of left ankle, initial encounter: Secondary | ICD-10-CM

## 2023-07-21 DIAGNOSIS — M25572 Pain in left ankle and joints of left foot: Secondary | ICD-10-CM | POA: Diagnosis not present

## 2023-07-21 NOTE — Assessment & Plan Note (Signed)
The patient does have a left ankle sprain with a history of the multiple sclerosis as well as the process will get x-rays to make sure there is no other occult fracture.  Trace effusion noted of the anterior lateral ankle joint but that can be more physiologic with patient decreasing range of motion.  Would like patient to continue to be working on range of motion.  Will do more of an Aircast than a true cam walker at this time.  Patient is using a cane already secondary to some difficulty with balance.  Discussed icing regimen, could consider formal physical therapy but will work with home exercises first.  Follow-up with me again 6 to 8 weeks otherwise.

## 2023-07-21 NOTE — Patient Instructions (Signed)
Xray today Keep using arnica Do prescribed exercises at least 3x a week See you again in 4 weeks You have 14 days to return or exchange your brace Call [phone], then return the brace to our office

## 2023-08-01 ENCOUNTER — Ambulatory Visit: Payer: PPO | Admitting: Family Medicine

## 2023-08-04 ENCOUNTER — Other Ambulatory Visit (HOSPITAL_COMMUNITY): Payer: Self-pay

## 2023-08-04 ENCOUNTER — Ambulatory Visit: Payer: PPO | Admitting: Neurology

## 2023-08-04 ENCOUNTER — Encounter: Payer: Self-pay | Admitting: Neurology

## 2023-08-04 VITALS — BP 130/62 | HR 69 | Ht 67.0 in | Wt 159.0 lb

## 2023-08-04 DIAGNOSIS — G43009 Migraine without aura, not intractable, without status migrainosus: Secondary | ICD-10-CM

## 2023-08-04 DIAGNOSIS — R269 Unspecified abnormalities of gait and mobility: Secondary | ICD-10-CM

## 2023-08-04 DIAGNOSIS — Z79899 Other long term (current) drug therapy: Secondary | ICD-10-CM | POA: Diagnosis not present

## 2023-08-04 DIAGNOSIS — S82892S Other fracture of left lower leg, sequela: Secondary | ICD-10-CM

## 2023-08-04 DIAGNOSIS — G35 Multiple sclerosis: Secondary | ICD-10-CM | POA: Diagnosis not present

## 2023-08-04 DIAGNOSIS — F988 Other specified behavioral and emotional disorders with onset usually occurring in childhood and adolescence: Secondary | ICD-10-CM

## 2023-08-04 DIAGNOSIS — R3915 Urgency of urination: Secondary | ICD-10-CM

## 2023-08-04 MED ORDER — AMPHETAMINE-DEXTROAMPHETAMINE 10 MG PO TABS
10.0000 mg | ORAL_TABLET | Freq: Two times a day (BID) | ORAL | 0 refills | Status: DC
  Filled 2023-08-04: qty 60, 30d supply, fill #0

## 2023-08-04 MED ORDER — FLUOXETINE HCL 20 MG PO CAPS
20.0000 mg | ORAL_CAPSULE | Freq: Every day | ORAL | 3 refills | Status: AC
  Filled 2023-08-04: qty 90, 90d supply, fill #0
  Filled 2024-02-05: qty 90, 90d supply, fill #1
  Filled 2024-05-11: qty 90, 90d supply, fill #2

## 2023-08-04 MED ORDER — GABAPENTIN 300 MG PO CAPS
300.0000 mg | ORAL_CAPSULE | Freq: Three times a day (TID) | ORAL | 3 refills | Status: DC
  Filled 2023-08-04: qty 270, 90d supply, fill #0
  Filled 2024-02-05: qty 270, 90d supply, fill #1
  Filled 2024-05-11: qty 270, 90d supply, fill #2

## 2023-08-04 MED ORDER — BUPROPION HCL 100 MG PO TABS
100.0000 mg | ORAL_TABLET | Freq: Every day | ORAL | 3 refills | Status: DC
  Filled 2023-08-04 – 2023-10-07 (×2): qty 90, 90d supply, fill #0
  Filled 2024-01-20: qty 90, 90d supply, fill #1
  Filled 2024-04-17: qty 90, 90d supply, fill #2
  Filled 2024-07-03 (×2): qty 90, 90d supply, fill #3

## 2023-08-04 NOTE — Progress Notes (Signed)
[facility]  PATIENT: Cynthia Nichols DOB: 1961/01/14  REFERRING DOCTOR OR PCP: Marian Sorrow, MD ([facility]); Maurie Boettcher, FNP (PCP) SOURCE: Patient, notes from neurology, imaging and lab reports.  _________________________________   HISTORICAL  CHIEF COMPLAINT:  Chief Complaint  Patient presents with   Follow-up    Pt in room 10 alone. Here for MS follow up. Pt reports 3-4 falls in last couple of weeks. Pt said she broke left ankle. Pt said increase symptoms on left side, stabbing pain from hip down to knee.     HISTORY OF PRESENT ILLNESS:  Cynthia Nichols is a 62 y.o. woman with multiple sclerosis.  She had no other exacerbations.     Update 08/04/2023: She had the first year of Mavenclad was initiated late June 2022 and second month 04/12/21 - 04/16/21.    She felt very tired and achy the second week but was fine a few days later.      Lymphocytes were 0.6 in 05/2021 and 0.8 10/07/2020 and 0.9 02/17/2022   She never got the MRI last year we ordered (very claustophobic).   She did the second year of Mavenclad  treatment 05/30/22-06/03/2022 and October.    She felt fine with the first week, except a HA.    The lymphocytes  were 0.3 08/11/2022 and 0.7 on  11/29/2022.   She broke her ankle 3 weeks ago, she feels due to reduced balance on a slick surface.   Her foot slid.  She could not catch herself.    In general, she felt her gait was stable.    Ankle is feeling better an she is walking without a boot now.   She broke her knee and shoulder and distal foot in past with a couple falls.     Her right leg is weaker than left.  She uses her right AFO for long walks only and a cane outside her home.  She has a milder foot drop on her left.   She has some spasticity in her right leg.   She has numbness and tingling in her feet.  She has itching  She has urge incontinence / nocturia.   Oxybutynin was poorly tolerated so she stopped   She would prefer not to take another  pill (we had discussed Myrbetriq).   She has 2x nocturia.    She has fatigue helped a bit by Adderall - she is just taking one 10 mg in am and we discussed taking asecond one a few hours later.    She sleeps fairly well on Ambien CR 6.25 mg nightly (6-8 hours).  .Leg cramps are better with potassium.    She has anxiety > depression.   She notes has cognitive issue.  Word finding is difficult at times and mild difficulties with STM.   She has trouble with parallel processing.     Potassium has been low so she takes a supplement.   She is on HCTZ.    She has LBP, worse with prolonged standing.    MRI lumbar in 2018 was normal by report.   She broke her right humerus in a fall in 2018.   Also has had right ankle fracture.    She has migraine headaches are doinw ell and she stopped zonisamide and Emgality,   She has non-migraine headaches more than migraine  MS History:  She had the onset of right leg weakness one day in 2011.  Symptoms did not improve.  However,  she began to have bladder issues a few years earlier.    She initially was seen at [facility].  MRI of the cervical spine 11/12/2009 showed only the one spot at [facility].   She had EP studies (normal) and CSF analysis which was reporgtedly c/w MS.   She was placed on Copaxone.  She had episodes of diplopia which were fleeting.   She was switched to Tecfidera bit had trouble tolerating it.   After several years, she switched to Tysabri.   She felt great after the first dose but felt poorly after the second dose so she stopped.   About 2 years ago, she started Mayzent but stopped after 3 months due to feeling poorly.   Though no relapses after she stopped, gait has slowly worsened.  Mavenclad was initiated in June 022.      Imaging reports (actual images have been requested but were not available at the time of the visit): MRI of the brain report 06/14/2019 "No significant change nonenhancing white matter foci compatible with multiple sclerosis. No new  lesion"  MRI of the cervical spine 06/14/2019 report "T2 signal: Dorsal lateral right at C1 stable. Right ventrolateral. C4 Which may or may not have been present previously. Bilateral ventral cord left greater than right as seen previously adjacent to extrusion as per below, question right progression versus artifact... Right lateral C6 stable.. Large longitudinal focus left  T2 stable on sagittal images.    Degenerative change with a large disc osteophyte complex at C5-C6 to the right with moderate bilateral foraminal narrowing and moderate spinal stenosis.  milder problems at other levels.  MRI of the brain 04/06/2023 ([facility]) showed multiple foci in the white matter consistent with chronic demyelination.  There were no new lesions compared to the MRI from 06/19/2019.   MRI of the cervical spine 04/06/2023 ([facility]) showed Redemonstrated multifocal white matter lesions within the cervical spine without enhancement to suggest active demyelination. No new lesions.     Redemonstrated moderate spinal canal stenosis at C5-C6 secondary to retrolisthesis and disc bulge/osteophyte complex. There is unchanged abnormal T2 signal within the anterior cord at this level that is favored to represent a chronic demyelinating plaque.    REVIEW OF SYSTEMS: Constitutional: No fevers, chills, sweats, or change in appetite Eyes: No visual changes, double vision, eye pain Ear, nose and throat: No hearing loss, ear pain, nasal congestion, sore throat Cardiovascular: No chest pain, palpitations Respiratory:  No shortness of breath at rest or with exertion.   No wheezes GastrointestinaI: No nausea, vomiting, diarrhea, abdominal pain, fecal incontinence Genitourinary:  No dysuria, urinary retention or frequency.  No nocturia. Musculoskeletal:  No neck pain, back pain Integumentary: No rash, pruritus, skin lesions Neurological: as above Psychiatric: No depression at this time.  No anxiety Endocrine: No palpitations,  diaphoresis, change in appetite, change in weigh or increased thirst Hematologic/Lymphatic:  No anemia, purpura, petechiae. Allergic/Immunologic: No itchy/runny eyes, nasal congestion, recent allergic reactions, rashes  ALLERGIES: Allergies  Allergen Reactions   Ampyra [Dalfampridine] Anaphylaxis and Shortness Of Breath    Chest pain, also    Imitrex [Sumatriptan] Other (See Comments)    Chest tightness   Crestor [Rosuvastatin Calcium] Other (See Comments)    Abdominal pain, shortness of breath   Peanut-Containing Drug Products Itching    Itching in the mouth (remarked that she still eats this in limited amounts)   Shrimp [Shellfish Allergy] Itching    Itching in the mouth (remarked that she still eats this in limited  amounts)    HOME MEDICATIONS:  Current Outpatient Medications:    bisoprolol (ZEBETA) 5 MG tablet, Take 1/2 tablet (2.5 mg total) by mouth in the morning and at bedtime., Disp: 90 tablet, Rfl: 2   bisoprolol (ZEBETA) 5 MG tablet, Take 1/2-1 tablet (2.5-5 mg total) by mouth daily for elevated blood pressure, Disp: 90 tablet, Rfl: 3   candesartan (ATACAND) 8 MG tablet, Take 1 tablet (8 mg total) by mouth daily. (Patient taking differently: Take 4 mg by mouth daily as needed (Blood pressure).), Disp: 90 tablet, Rfl: 1   FLUoxetine (PROZAC) 20 MG capsule, Take 1 capsule (20 mg total) by mouth daily., Disp: 90 capsule, Rfl: 3   hydrochlorothiazide (MICROZIDE) 12.5 MG capsule, Take 1 capsule (12.5 mg total) by mouth daily for blood pressure, Disp: 90 capsule, Rfl: 3   hydrOXYzine (VISTARIL) 25 MG capsule, Take 1 capsule (25 mg total) by mouth 1 - 2 (two) times daily as needed., Disp: 60 capsule, Rfl: 5   naratriptan (AMERGE) 2.5 MG tablet, Take 1 tablet (2.5 mg total) by mouth as directed for Migraine. May take a second dose after 4 hours if needed., Disp: 10 tablet, Rfl: 11   pravastatin (PRAVACHOL) 20 MG tablet, Take 1 tablet (20 mg total) by mouth daily., Disp: 90 tablet, Rfl:  1   pravastatin (PRAVACHOL) 20 MG tablet, Take 1 tablet (20 mg total) by mouth daily., Disp: 90 tablet, Rfl: 3   zolpidem (AMBIEN CR) 6.25 MG CR tablet, Take 1 tablet by mouth at bedtime, Disp: 30 tablet, Rfl: 3   zolpidem (AMBIEN CR) 6.25 MG CR tablet, Take 1 tablet (6.25 mg total) by mouth at bedtime., Disp: 90 tablet, Rfl: 1   albuterol (PROVENTIL) (2.5 MG/3ML) 0.083% nebulizer solution, USE 1 VIAL VIA NEBULIZER EVERY 4 TO 6 HOURS AS NEEDED FOR SHORTNESS OF BREATH /WHEEZING, Disp: 225 mL, Rfl: 0   amphetamine-dextroamphetamine (ADDERALL) 10 MG tablet, Take 1 tablet (10 mg total) by mouth 2 (two) times daily., Disp: 60 tablet, Rfl: 0   buPROPion (WELLBUTRIN) 100 MG tablet, Take 1 tablet (100 mg total) by mouth daily with breakfast., Disp: 90 tablet, Rfl: 3   candesartan (ATACAND) 8 MG tablet, Take 1 tablet (8 mg total) by mouth daily as needed for blood pressure. (Patient not taking: Reported on 08/04/2023), Disp: 90 tablet, Rfl: 1   candesartan (ATACAND) 8 MG tablet, Take 1 tablet (8 mg total) by mouth daily as needed for blood pressure - rarely uses (Patient not taking: Reported on 08/04/2023), Disp: 90 tablet, Rfl: 1   Cholecalciferol (VITAMIN D) 50 MCG (2000 UT) CAPS, Take 5,000 Units by mouth every 30 (thirty) days. 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   Aortic atherosclerosis (HCC) 11/29/2017   Asthma    Asthma 11/02/2019   Closed fracture of shaft of right humerus 05/13/2017   Closed low lateral malleolus fracture, left, initial  encounter 10/17/2018   COVID 07/2021   mild case   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  Essential hypertension, benign 11/29/2017   Fracture of fifth metatarsal bone 08/13/2015   Greater trochanteric bursitis, left 12/31/2019   Injection given December 31, 2019   Headache(784.0)    Humerus fracture    right with radial nerve palsy   Hypertension    IBS (irritable bowel syndrome)    Migraine without aura and without status migrainosus, not intractable 02/09/2018   MS (multiple sclerosis) (HCC)    Multiple sclerosis, relapsing-remitting (HCC) 12/14/2011   Osteoporosis 11/29/2017   Other fracture of shaft of right humerus, initial encounter for closed fracture 05/13/2017   Patella fracture 07/03/2015   Hairline oblique     Patellar subluxation, left, initial encounter 08/14/2018   PONV (postoperative nausea and vomiting)    only on this surgery   Radial nerve palsy, 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   Right-sided    Unspecified venous (peripheral) insufficiency 11/09/2011   Venous aneurysm    Right side of neck    PAST SURGICAL HISTORY: Past Surgical History:  Procedure Laterality Date   ABDOMINAL HYSTERECTOMY     BREAST MASS EXCISION     Bilateral breast mass excision ( Benign)   ORIF HUMERUS FRACTURE Right 05/17/2017   RADIOLOGY WITH ANESTHESIA N/A 11/03/2021   Procedure: MRI WITH BRAIN WITH AND WITHOUT,CERVICAL WITH AND WITHOUT CONTRAST;  Surgeon: Radiologist, Medication, MD;  Location: [facility] OR;  Service: Radiology;  Laterality: N/A;   TENNIS ELBOW RELEASE/NIRSCHEL PROCEDURE Right 05/17/2017   Procedure: EXPLORATION AND EXPOSURE OF RADIAL NERVE RIGHT ARM;  Surgeon: Dominica Severin, MD;  Location: [facility] OR;  Service: Orthopedics;  Laterality: Right;    FAMILY HISTORY: Family History  Adopted: Yes    SOCIAL HISTORY:  Social History   Socioeconomic History   Marital status: Married    Spouse name: Gayane Tugman   Number of children: 3    Years of education: Not on file   Highest education level: Associate degree: occupational, Scientist, product/process development, or vocational program  Occupational History   Occupation: disabled    Comment: Charity fundraiser  Tobacco Use   Smoking status: Never   Smokeless tobacco: Never  Vaping Use   Vaping status: Never Used  Substance and Sexual Activity   Alcohol use: No    Alcohol/week: 0.0 standard drinks of alcohol   Drug use: No   Sexual activity: Yes    Partners: Male  Other Topics Concern   Not on file  Social History Narrative   Not on file   Social Determinants of Health   Financial Resource Strain: Low Risk  (02/08/2019)   Overall Financial Resource Strain (CARDIA)    Difficulty of Paying Living Expenses: Not hard at all  Food Insecurity: No Food Insecurity (02/08/2019)   Hunger Vital Sign    Worried About Running Out of Food in the Last Year: Never true    Ran Out of Food in the Last Year: Never true  Transportation Needs: No Transportation Needs (02/08/2019)   PRAPARE - Administrator, Civil Service (Medical): No    Lack of Transportation (Non-Medical): No  Physical Activity: Sufficiently Active (02/08/2019)   Exercise Vital Sign    Days of Exercise per Week: 7 days    Minutes of Exercise per Session: 30 min  Stress: No Stress Concern Present (02/08/2019)   [employer] of Occupational Health - Occupational Stress Questionnaire    Feeling of Stress : Not at all  Social Connections: Socially Integrated (02/08/2019)   Social Connection and Isolation Panel [NHANES]  Frequency of Communication with Friends and Family: More than three times a week    Frequency of Social Gatherings with Friends and Family: Once a week    Attends Religious Services: More than 4 times per year    Active Member of [employer] or Organizations: Yes    Attends Engineer, structural: More than 4 times per year    Marital Status: Married  Catering manager Violence: Not At Risk (02/08/2019)   Humiliation,  Afraid, Rape, and Kick questionnaire    Fear of Current or Ex-Partner: No    Emotionally Abused: No    Physically Abused: No    Sexually Abused: No     PHYSICAL EXAM  Vitals:   08/04/23 1129  BP: 130/62  Pulse: 69  Weight: 159 lb (72.1 kg)  Height: 5\' 7"  (1.702 m)    Body mass index is 24.9 kg/m.   General: The patient is well-developed and well-nourished and in no acute distress  HEENT:  Head is [place]/AT.  Sclera are anicteric.    Neck: the neck is nontender.  Skin: Extremities are without rash or  edema.  Musculoskeletal:  Back is nontender  Neurologic Exam  Mental status: The patient is alert and oriented x 3 at the time of the examination. The patient has apparent normal recent and remote memory, with an apparently normal attention span and concentration ability.   Speech is normal.  Cranial nerves: Extraocular movements are full.  Facial strength appeared normal.  No dysarthria.  No obvious hearing deficits are noted.  Motor:  Muscle bulk is normal.   Tone is increased in the right leg.   Strength is 3/5 right hip flexors, 4+/5 quads, 4/5 hamstrings and 2+/5 toe/ankle extension.   Strength is  5 / 5 elsewhere.   Sensory: Sensory testing is intact to pinprick, soft touch and vibration sensation in all 4 extremities.  Coordination: Cerebellar testing reveals good finger-nose-finger and reduced heel to shin worse on right  Gait and station: Station is normal.   She has a wide based gait with a right foot drop.  Unable to do a tandem walk  The Romberg is negative.  Reflexes: Deep tendon reflexes are symmetric and normal in arms, slightly increased right leg.       DIAGNOSTIC DATA (LABS, IMAGING, TESTING) - I reviewed patient records, labs, notes, testing and imaging myself where available.  Lab Results  Component Value Date   WBC 4.1 11/29/2022   HGB 13.5 11/29/2022   HCT 39.5 11/29/2022   MCV 92 11/29/2022   PLT 194 11/29/2022      Component Value  Date/Time   NA 144 11/29/2022 1401   K 3.6 11/29/2022 1401   CL 102 11/29/2022 1401   CO2 26 11/29/2022 1401   GLUCOSE 90 11/29/2022 1401   GLUCOSE 120 (H) 07/11/2020 1708   BUN 13 11/29/2022 1401   CREATININE 0.78 11/29/2022 1401   CREATININE 0.59 02/09/2018 0930   CALCIUM 9.7 11/29/2022 1401   PROT 7.0 11/29/2022 1401   ALBUMIN 4.4 11/29/2022 1401   AST 27 11/29/2022 1401   ALT 32 11/29/2022 1401   ALKPHOS 97 11/29/2022 1401   BILITOT 0.8 11/29/2022 1401   GFRNONAA >60 07/11/2020 1708   GFRNONAA 84 12/14/2017 0945   GFRAA 98 12/14/2017 0945    Lab Results  Component Value Date   TSH 1.07 03/09/2017       ASSESSMENT AND PLAN  Multiple sclerosis, relapsing-remitting (HCC)  High risk medication use  Attention  deficit disorder (ADD) without hyperactivity  Migraine without aura and without status migrainosus, not intractable  Urinary urgency  Gait disturbance  Closed fracture of left ankle, sequela   Has completed Mavenclad .  Labs with PCP were normal.     She has had no recent exacerbation or progression.   Has had some very slow progression likely SPMS changes mixed with aging. For migraines will take Amerge (if not covered by insurance, change to Relpax 20 mg). Due to fractures advised to f/u with PCP.  Boe density in past showed osteoporosis and may need treatment. Has had more anxiety and will add fluoxetine and have her continue buproprion.   Renew gabapentin rtc 6 months or sooner if new or worsening issues  This visit is part of a comprehensive longitudinal care medical relationship regarding the patients primary diagnosis of MS and related concerns.    Jaelin Fackler A. Epimenio Foot, MD, [facility] 08/04/2023, 11:56 AM Certified in Neurology, Clinical Neurophysiology, Sleep Medicine and Neuroimaging  [facility] [address], [address] [place], [place] [address] [phone]

## 2023-08-08 ENCOUNTER — Other Ambulatory Visit (HOSPITAL_COMMUNITY): Payer: Self-pay

## 2023-08-08 ENCOUNTER — Other Ambulatory Visit: Payer: Self-pay

## 2023-08-12 NOTE — Progress Notes (Signed)
[name] [address] [place] [address] Phone: [phone] Subjective:   Cynthia Nichols, am serving as a scribe for Dr. Antoine Primas.  I'm seeing this patient by the request  of:  Marylen Ponto, MD  CC: Ankle pain, bone pain  [id number]  07/21/2023 The patient does have a left ankle sprain with a history of the multiple sclerosis as well as the process will get x-rays to make sure there is no other occult fracture.  Trace effusion noted of the anterior lateral ankle joint but that can be more physiologic with patient decreasing range of motion.  Would like patient to continue to be working on range of motion.  Will do more of an Aircast than a true cam walker at this time.  Patient is using a cane already secondary to some difficulty with balance.  Discussed icing regimen, could consider formal physical therapy but will work with home exercises first.  Follow-up with me again 6 to 8 weeks otherwise.     Updated 08/23/2023 Cynthia Nichols is a 62 y.o. female coming in with complaint of L ankle. Feels like we are moving in the right direction with the ankle. Has its moments. Having new L wrist pain around thumb. Has been getting better, but at one point waking her at night. Taking some OTC but only takes the edge off.       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   Essential hypertension, benign 11/29/2017   Fracture of fifth metatarsal bone 08/13/2015   Greater trochanteric bursitis, left 12/31/2019   Injection given December 31, 2019   Headache(784.0)    Humerus fracture     right with radial nerve palsy   Hypertension    IBS (irritable bowel syndrome)    Migraine without aura and without status migrainosus, not intractable 02/09/2018   MS (multiple sclerosis) (HCC)    Multiple sclerosis, relapsing-remitting (HCC) 12/14/2011   Osteoporosis 11/29/2017   Other fracture of shaft of right humerus, initial encounter for closed fracture 05/13/2017   Patella fracture 07/03/2015   Hairline oblique     Patellar subluxation, left, initial encounter 08/14/2018   PONV (postoperative nausea and vomiting)    only on this surgery   Radial nerve palsy, 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   Right-sided    Unspecified venous (peripheral) insufficiency 11/09/2011   Venous aneurysm    Right side of neck   Past Surgical History:  Procedure Laterality Date   ABDOMINAL HYSTERECTOMY     BREAST MASS EXCISION     Bilateral breast mass excision ( Benign)   ORIF HUMERUS FRACTURE Right 05/17/2017   RADIOLOGY WITH ANESTHESIA N/A 11/03/2021   Procedure: MRI WITH BRAIN WITH AND WITHOUT,CERVICAL WITH AND WITHOUT CONTRAST;  Surgeon: Radiologist, Medication, MD;  Location: [facility] OR;  Service: Radiology;  Laterality: N/A;   TENNIS ELBOW RELEASE/NIRSCHEL PROCEDURE Right 05/17/2017   Procedure: EXPLORATION AND EXPOSURE OF RADIAL NERVE RIGHT ARM;  Surgeon: Dominica Severin, MD;  Location: [facility] OR;  Service: Orthopedics;  Laterality: Right;  Social History   Socioeconomic History   Marital status: Married    Spouse name: Trimeka Gander   Number of children: 3   Years of education: Not on file   Highest education level: Associate degree: occupational, Scientist, product/process development, or vocational program  Occupational History   Occupation: disabled    Comment: Charity fundraiser  Tobacco Use   Smoking status: Never   Smokeless tobacco: Never  Vaping Use   Vaping status: Never Used  Substance and Sexual Activity   Alcohol use: No    Alcohol/week: 0.0 standard drinks of alcohol   Drug use: No    Sexual activity: Yes    Partners: Male  Other Topics Concern   Not on file  Social History Narrative   Not on file   Social Determinants of Health   Financial Resource Strain: Low Risk  (02/08/2019)   Overall Financial Resource Strain (CARDIA)    Difficulty of Paying Living Expenses: Not hard at all  Food Insecurity: No Food Insecurity (02/08/2019)   Hunger Vital Sign    Worried About Running Out of Food in the Last Year: Never true    Ran Out of Food in the Last Year: Never true  Transportation Needs: No Transportation Needs (02/08/2019)   PRAPARE - Administrator, Civil Service (Medical): No    Lack of Transportation (Non-Medical): No  Physical Activity: Sufficiently Active (02/08/2019)   Exercise Vital Sign    Days of Exercise per Week: 7 days    Minutes of Exercise per Session: 30 min  Stress: No Stress Concern Present (02/08/2019)   [employer] of Occupational Health - Occupational Stress Questionnaire    Feeling of Stress : Not at all  Social Connections: Socially Integrated (02/08/2019)   Social Connection and Isolation Panel [NHANES]    Frequency of Communication with Friends and Family: More than three times a week    Frequency of Social Gatherings with Friends and Family: Once a week    Attends Religious Services: More than 4 times per year    Active Member of [employer] or Organizations: Yes    Attends Engineer, structural: More than 4 times per year    Marital Status: Married   Allergies  Allergen Reactions   Ampyra [Dalfampridine] Anaphylaxis and Shortness Of Breath    Chest pain, also    Imitrex [Sumatriptan] Other (See Comments)    Chest tightness   Crestor [Rosuvastatin Calcium] Other (See Comments)    Abdominal pain, shortness of breath   Peanut-Containing Drug Products Itching    Itching in the mouth (remarked that she still eats this in limited amounts)   Shrimp [Shellfish Allergy] Itching    Itching in the mouth (remarked that she  still eats this in limited amounts)   Family History  Adopted: Yes     Current Outpatient Medications (Cardiovascular):    bisoprolol (ZEBETA) 5 MG tablet, Take 1/2 tablet (2.5 mg total) by mouth in the morning and at bedtime.   bisoprolol (ZEBETA) 5 MG tablet, Take 1/2-1 tablet (2.5-5 mg total) by mouth daily for elevated blood pressure   candesartan (ATACAND) 8 MG tablet, Take 1 tablet (8 mg total) by mouth daily. (Patient taking differently: Take 4 mg by mouth daily as needed (Blood pressure).)   candesartan (ATACAND) 8 MG tablet, Take 1 tablet (8 mg total) by mouth daily as needed for blood pressure. (Patient not taking: Reported on 08/04/2023)   candesartan (ATACAND) 8 MG tablet, Take 1 tablet (8 mg total) by  mouth daily as needed for blood pressure - rarely uses (Patient not taking: Reported on 08/04/2023)   hydrochlorothiazide (MICROZIDE) 12.5 MG capsule, Take 1 capsule (12.5 mg total) by mouth daily for blood pressure   pravastatin (PRAVACHOL) 20 MG tablet, TAKE 1 TABLET BY MOUTH DAILY (Patient taking differently: Take 20 mg by mouth at bedtime.)   pravastatin (PRAVACHOL) 20 MG tablet, Take 1 tablet (20 mg total) by mouth daily.   pravastatin (PRAVACHOL) 20 MG tablet, Take 1 tablet (20 mg total) by mouth daily.  Current Outpatient Medications (Respiratory):    albuterol (PROVENTIL) (2.5 MG/3ML) 0.083% nebulizer solution, USE 1 VIAL VIA NEBULIZER EVERY 4 TO 6 HOURS AS NEEDED FOR SHORTNESS OF BREATH Sima Matas  Current Outpatient Medications (Analgesics):    naratriptan (AMERGE) 2.5 MG tablet, Take 1 tablet (2.5 mg total) by mouth as directed for Migraine. May take a second dose after 4 hours if needed.   Current Outpatient Medications (Other):    amphetamine-dextroamphetamine (ADDERALL) 10 MG tablet, Take 1 tablet (10 mg total) by mouth 2 (two) times daily.   buPROPion (WELLBUTRIN) 100 MG tablet, Take 1 tablet (100 mg total) by mouth daily with breakfast.   Cholecalciferol (VITAMIN  D) 50 MCG (2000 UT) CAPS, Take 5,000 Units by mouth every 30 (thirty) days. (Patient not taking: Reported on 08/04/2023)   FLUoxetine (PROZAC) 20 MG capsule, Take 1 capsule (20 mg total) by mouth daily.   gabapentin (NEURONTIN) 300 MG capsule, Take 1 capsule (300 mg total) by mouth 3 (three) times daily.   hydrOXYzine (VISTARIL) 25 MG capsule, Take 1 capsule (25 mg total) by mouth 1 - 2 (two) times daily as needed.   zolpidem (AMBIEN CR) 6.25 MG CR tablet, Take 1 tablet by mouth at bedtime   zolpidem (AMBIEN CR) 6.25 MG CR tablet, Take 1 tablet (6.25 mg total) by mouth at bedtime.   Reviewed prior external information including notes and imaging from  primary care provider As well as notes that were available from care everywhere and other healthcare systems.  Past medical history, social, surgical and family history all reviewed in electronic medical record.  No pertanent information unless stated regarding to the chief complaint.   Review of Systems:  No headache, visual changes, nausea, vomiting, diarrhea, constipation, dizziness, abdominal pain, skin rash, fevers, chills, night sweats, weight loss, swollen lymph nodes, body aches, joint swelling, chest pain, shortness of breath, mood changes. POSITIVE muscle aches  Objective  Blood pressure 122/80, pulse 75, height 5\' 7"  (1.702 m), SpO2 99%.   General: No apparent distress alert and oriented x3 mood and affect normal, dressed appropriately.  HEENT: Pupils equal, extraocular movements intact  Respiratory: Patient's speak in full sentences and does not appear short of breath  Cardiovascular: No lower extremity edema, non tender, no erythema  Antalgic gait noted.  Ankle though does appear to have significant less inflammation and improvement in range of motion than previous exam.  Patient's left wrist does have a positive Finkelstein and pain in the first compartment of the wrist noted fairly severe.   Procedure: Real-time Ultrasound  Guided Injection of left Abductor pollicis longs tendon sheath Device: GE Logiq Q7  Ultrasound guided injection is preferred based studies that show increased duration, increased effect, greater accuracy, decreased procedural pain, increased response rate with ultrasound guided versus blind injection.  Verbal informed consent obtained.  Time-out conducted.  Noted no overlying erythema, induration, or other signs of local infection.  Skin prepped in a sterile fashion.  Local anesthesia:  Topical Ethyl chloride.  With sterile technique and under real time ultrasound guidance:  tendon visualized.  23g 5/8 inch needle inserted distal to proximal approach into tendon sheath. Pictures taken  for needle placement. Patient did have injection of 0.5 cc of of 0.5% Marcaine, and 0.5 cc of Kenalog 40 mg/dL. Completed without difficulty  Pain immediately resolved suggesting accurate placement of the medication.  Advised to call if fevers/chills, erythema, induration, drainage, or persistent bleeding.  Images permanently stored and available for review in the ultrasound unit.  Impression: Technically successful ultrasound guided injection.    Impression and Recommendations:

## 2023-08-15 ENCOUNTER — Encounter: Payer: Self-pay | Admitting: Neurology

## 2023-08-15 ENCOUNTER — Ambulatory Visit: Payer: PPO | Admitting: Neurology

## 2023-08-20 DIAGNOSIS — G43909 Migraine, unspecified, not intractable, without status migrainosus: Secondary | ICD-10-CM | POA: Diagnosis not present

## 2023-08-20 DIAGNOSIS — I10 Essential (primary) hypertension: Secondary | ICD-10-CM | POA: Diagnosis not present

## 2023-08-20 DIAGNOSIS — L03114 Cellulitis of left upper limb: Secondary | ICD-10-CM | POA: Diagnosis not present

## 2023-08-23 ENCOUNTER — Ambulatory Visit: Payer: PPO | Admitting: Family Medicine

## 2023-08-23 ENCOUNTER — Encounter: Payer: Self-pay | Admitting: Family Medicine

## 2023-08-23 ENCOUNTER — Other Ambulatory Visit: Payer: Self-pay

## 2023-08-23 VITALS — BP 122/80 | HR 75 | Ht 67.0 in

## 2023-08-23 DIAGNOSIS — M25572 Pain in left ankle and joints of left foot: Secondary | ICD-10-CM

## 2023-08-23 DIAGNOSIS — M654 Radial styloid tenosynovitis [de Quervain]: Secondary | ICD-10-CM | POA: Insufficient documentation

## 2023-08-23 NOTE — Assessment & Plan Note (Signed)
Patient given injection and tolerated the procedure well, discussed icing regimen and home exercises.  Discussed avoiding certain activities.  Potential bracing.  Hopeful that this will make improvement.  With patient's multiple sclerosis do not want to make any other significant changes if we can help it.  Follow-up with me again in 6 to 8 weeks

## 2023-08-23 NOTE — Patient Instructions (Addendum)
Injected wrist  See me again in 2-3 months

## 2023-09-08 ENCOUNTER — Other Ambulatory Visit (HOSPITAL_COMMUNITY): Payer: Self-pay

## 2023-10-04 NOTE — Progress Notes (Signed)
 [facility] [address] [place] [address] Phone: [phone] Subjective:   Cynthia Nichols, am serving as a scribe for Dr. Ronnell Coins.  I'm seeing this patient by the request  of:  Gaither Juba, MD  CC: First toe pain  [id number]  08/23/2023 Patient given injection and tolerated the procedure well, discussed icing regimen and home exercises.  Discussed avoiding certain activities.  Potential bracing.  Hopeful that this will make improvement.  With patient's multiple sclerosis do not want to make any other significant changes if we can help it.  Follow-up with me again in 6 to 8 weeks     Update 10/05/2023 Cynthia Nichols is a 63 y.o. female coming in with complaint of L ankle and R wrist pain.  Previously seen and had more of a de Quervain's tenosynovitis.  Injection at last exam 6 weeks ago.  Patient states that her wrist and ankle are better.   Pain in R foot for past 3 weeks at 1st MTP. Painful to weight bear. Denies any numbness.    Patient also had x-rays of December that showed a very tiny possible avulsion of the distal anterior tibia.  Small avulsion fracture noted but otherwise unremarkable.  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   Essential hypertension, benign 11/29/2017   Fracture of fifth metatarsal bone 08/13/2015   Greater trochanteric bursitis, left 12/31/2019   Injection given December 31, 2019   Headache(784.0)    Humerus fracture    right with radial nerve palsy   Hypertension    IBS (irritable bowel syndrome)    Migraine without aura and without  status migrainosus, not intractable 02/09/2018   MS (multiple sclerosis) (HCC)    Multiple sclerosis, relapsing-remitting (HCC) 12/14/2011   Osteoporosis 11/29/2017   Other fracture of shaft of right humerus, initial encounter for closed fracture 05/13/2017   Patella fracture 07/03/2015   Hairline oblique     Patellar subluxation, left, initial encounter 08/14/2018   PONV (postoperative nausea and vomiting)    only on this surgery   Radial nerve palsy, 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   Right-sided    Unspecified venous (peripheral) insufficiency 11/09/2011   Venous aneurysm    Right side of neck   Past Surgical History:  Procedure Laterality Date   ABDOMINAL HYSTERECTOMY     BREAST MASS EXCISION     Bilateral breast mass excision ( Benign)   ORIF HUMERUS FRACTURE Right 05/17/2017   RADIOLOGY WITH ANESTHESIA N/A 11/03/2021   Procedure: MRI WITH BRAIN WITH AND WITHOUT,CERVICAL WITH AND WITHOUT CONTRAST;  Surgeon: Radiologist, Medication, MD;  Location: [facility] OR;  Service: Radiology;  Laterality: N/A;   TENNIS ELBOW RELEASE/NIRSCHEL PROCEDURE Right 05/17/2017   Procedure: EXPLORATION AND EXPOSURE OF RADIAL NERVE RIGHT ARM;  Surgeon: Ronn Cohn, MD;  Location: [facility] OR;  Service: Orthopedics;  Laterality: Right;   Social History   Socioeconomic History   Marital status: Married    Spouse name: Carrey Vittitoe   Number of children: 3   Years of education:  Not on file   Highest education level: Associate degree: occupational, Scientist, product/process development, or vocational program  Occupational History   Occupation: disabled    Comment: RN  Tobacco Use   Smoking status: Never   Smokeless tobacco: Never  Vaping Use   Vaping status: Never Used  Substance and Sexual Activity   Alcohol use: No    Alcohol/week: 0.0 standard drinks of alcohol   Drug use: No   Sexual activity: Yes    Partners: Male  Other Topics Concern   Not on file  Social History Narrative   Not on file    Social Drivers of Health   Financial Resource Strain: Low Risk  (02/08/2019)   Overall Financial Resource Strain (CARDIA)    Difficulty of Paying Living Expenses: Not hard at all  Food Insecurity: No Food Insecurity (02/08/2019)   Hunger Vital Sign    Worried About Running Out of Food in the Last Year: Never true    Ran Out of Food in the Last Year: Never true  Transportation Needs: No Transportation Needs (02/08/2019)   PRAPARE - Administrator, Civil Service (Medical): No    Lack of Transportation (Non-Medical): No  Physical Activity: Sufficiently Active (02/08/2019)   Exercise Vital Sign    Days of Exercise per Week: 7 days    Minutes of Exercise per Session: 30 min  Stress: No Stress Concern Present (02/08/2019)   [employer] of Occupational Health - Occupational Stress Questionnaire    Feeling of Stress : Not at all  Social Connections: Socially Integrated (02/08/2019)   Social Connection and Isolation Panel [NHANES]    Frequency of Communication with Friends and Family: More than three times a week    Frequency of Social Gatherings with Friends and Family: Once a week    Attends Religious Services: More than 4 times per year    Active Member of [employer] or Organizations: Yes    Attends Engineer, structural: More than 4 times per year    Marital Status: Married   Allergies  Allergen Reactions   Ampyra [Dalfampridine] Anaphylaxis and Shortness Of Breath    Chest pain, also    Imitrex [Sumatriptan] Other (See Comments)    Chest tightness   Crestor  [Rosuvastatin  Calcium ] Other (See Comments)    Abdominal pain, shortness of breath   Peanut-Containing Drug Products Itching    Itching in the mouth (remarked that she still eats this in limited amounts)   Shrimp [Shellfish Allergy] Itching    Itching in the mouth (remarked that she still eats this in limited amounts)   Family History  Adopted: Yes     Current Outpatient Medications (Cardiovascular):     bisoprolol  (ZEBETA ) 5 MG tablet, Take 1/2 tablet (2.5 mg total) by mouth in the morning and at bedtime.   bisoprolol  (ZEBETA ) 5 MG tablet, Take 1/2-1 tablet (2.5-5 mg total) by mouth daily for elevated blood pressure   candesartan  (ATACAND ) 8 MG tablet, Take 1 tablet (8 mg total) by mouth daily. (Patient taking differently: Take 4 mg by mouth daily as needed (Blood pressure).)   candesartan  (ATACAND ) 8 MG tablet, Take 1 tablet (8 mg total) by mouth daily as needed for blood pressure.   candesartan  (ATACAND ) 8 MG tablet, Take 1 tablet (8 mg total) by mouth daily as needed for blood pressure - rarely uses   hydrochlorothiazide  (MICROZIDE ) 12.5 MG capsule, Take 1 capsule (12.5 mg total) by mouth daily for blood pressure   pravastatin  (PRAVACHOL ) 20 MG  tablet, Take 1 tablet (20 mg total) by mouth daily.   pravastatin  (PRAVACHOL ) 20 MG tablet, Take 1 tablet (20 mg total) by mouth daily.   pravastatin  (PRAVACHOL ) 20 MG tablet, TAKE 1 TABLET BY MOUTH DAILY (Patient taking differently: Take 20 mg by mouth at bedtime.)  Current Outpatient Medications (Respiratory):    albuterol  (PROVENTIL ) (2.5 MG/3ML) 0.083% nebulizer solution, USE 1 VIAL VIA NEBULIZER EVERY 4 TO 6 HOURS AS NEEDED FOR SHORTNESS OF BREATH Ula Gambler  Current Outpatient Medications (Analgesics):    naratriptan  (AMERGE) 2.5 MG tablet, Take 1 tablet (2.5 mg total) by mouth as directed for Migraine. May take a second dose after 4 hours if needed.   Current Outpatient Medications (Other):    amphetamine -dextroamphetamine  (ADDERALL ) 10 MG tablet, Take 1 tablet (10 mg total) by mouth 2 (two) times daily.   buPROPion  (WELLBUTRIN ) 100 MG tablet, Take 1 tablet (100 mg total) by mouth daily with breakfast.   Cholecalciferol (VITAMIN D ) 50 MCG (2000 UT) CAPS, Take 5,000 Units by mouth every 30 (thirty) days.   FLUoxetine  (PROZAC ) 20 MG capsule, Take 1 capsule (20 mg total) by mouth daily.   gabapentin  (NEURONTIN ) 300 MG capsule, Take 1 capsule (300  mg total) by mouth 3 (three) times daily.   hydrOXYzine  (VISTARIL ) 25 MG capsule, Take 1 capsule (25 mg total) by mouth 1 - 2 (two) times daily as needed.   zolpidem  (AMBIEN  CR) 6.25 MG CR tablet, Take 1 tablet by mouth at bedtime   zolpidem  (AMBIEN  CR) 6.25 MG CR tablet, Take 1 tablet (6.25 mg total) by mouth at bedtime.   Reviewed prior external information including notes and imaging from  primary care provider As well as notes that were available from care everywhere and other healthcare systems.  Past medical history, social, surgical and family history all reviewed in electronic medical record.  No pertanent information unless stated regarding to the chief complaint.   Review of Systems:  No headache, visual changes, nausea, vomiting, diarrhea, constipation, dizziness, abdominal pain, skin rash, fevers, chills, night sweats, weight loss, swollen lymph nodes, body aches, joint swelling, chest pain, shortness of breath, mood changes. POSITIVE muscle aches  Objective  Blood pressure 122/74, pulse (!) 54, height 5\' 7"  (1.702 m), weight 157 lb (71.2 kg), SpO2 100%.   General: No apparent distress alert and oriented x3 mood and affect normal, dressed appropriately.  HEENT: Pupils equal, extraocular movements intact  Respiratory: Patient's speak in full sentences and does not appear short of breath  Cardiovascular: No lower extremity edema, non tender, no erythema  Right foot exam shows that the first toe does have some inflammation and swelling noted.  No significant callus forming nation noted on the plantar aspect under the first MTP.  Hallux limitus noted neuropathy noted  Procedure: Real-time Ultrasound Guided Injection of right first MTP Device: GE Logiq Q7 Ultrasound guided injection is preferred based studies that show increased duration, increased effect, greater accuracy, decreased procedural pain, increased response rate, and decreased cost with ultrasound guided versus blind  injection.  Verbal informed consent obtained.  Time-out conducted.  Noted no overlying erythema, induration, or other signs of local infection.  Skin prepped in a sterile fashion.  Local anesthesia: Topical Ethyl chloride.  With sterile technique and under real time ultrasound guidance: After verbal consent prepped with alcohol swab and with a 25-gauge half inch needle injected with 0.5 cc of 0.5% Marcaine  and 0.5 cc of Kenalog  40 mg/mL Completed without difficulty  Pain immediately resolved suggesting accurate  placement of the medication.  Advised to call if fevers/chills, erythema, induration, drainage, or persistent bleeding.  Impression: Technically successful ultrasound guided injection.    Impression and Recommendations:    The above documentation has been reviewed and is accurate and complete Akiko Schexnider M Abigail Marsiglia, DO

## 2023-10-05 ENCOUNTER — Encounter: Payer: Self-pay | Admitting: Family Medicine

## 2023-10-05 ENCOUNTER — Ambulatory Visit: Payer: PPO | Admitting: Family Medicine

## 2023-10-05 ENCOUNTER — Other Ambulatory Visit: Payer: Self-pay

## 2023-10-05 VITALS — BP 122/74 | HR 54 | Ht 67.0 in | Wt 157.0 lb

## 2023-10-05 DIAGNOSIS — M25531 Pain in right wrist: Secondary | ICD-10-CM

## 2023-10-05 DIAGNOSIS — G35 Multiple sclerosis: Secondary | ICD-10-CM | POA: Diagnosis not present

## 2023-10-05 DIAGNOSIS — G35D Multiple sclerosis, unspecified: Secondary | ICD-10-CM

## 2023-10-05 DIAGNOSIS — M7751 Other enthesopathy of right foot: Secondary | ICD-10-CM | POA: Diagnosis not present

## 2023-10-05 DIAGNOSIS — G35A Relapsing-remitting multiple sclerosis: Secondary | ICD-10-CM

## 2023-10-05 NOTE — Assessment & Plan Note (Signed)
 Patient given injection and tolerated the procedure well.  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  We discussed ways to keep the callus from occurring.  Discussed proper shoes that I think will be beneficial including altra.  Patient given injection today secondary to the severity of pain and was able to walk out of here feeling significantly better.  Discussed icing regimen and home exercises.  Follow-up again in 6 to 8 weeks

## 2023-10-05 NOTE — Patient Instructions (Signed)
 See me again in 8 weeks Wart remover cream for 1 week Pumice stone to area

## 2023-10-07 ENCOUNTER — Other Ambulatory Visit: Payer: Self-pay

## 2023-10-07 ENCOUNTER — Other Ambulatory Visit (HOSPITAL_COMMUNITY): Payer: Self-pay

## 2023-10-07 ENCOUNTER — Other Ambulatory Visit: Payer: Self-pay | Admitting: Neurology

## 2023-10-10 ENCOUNTER — Encounter (HOSPITAL_COMMUNITY): Payer: Self-pay

## 2023-10-10 ENCOUNTER — Other Ambulatory Visit (HOSPITAL_COMMUNITY): Payer: Self-pay

## 2023-10-12 ENCOUNTER — Other Ambulatory Visit: Payer: Self-pay | Admitting: Neurology

## 2023-10-12 ENCOUNTER — Other Ambulatory Visit (HOSPITAL_COMMUNITY): Payer: Self-pay

## 2023-10-13 ENCOUNTER — Other Ambulatory Visit (HOSPITAL_COMMUNITY): Payer: Self-pay

## 2023-10-13 MED ORDER — AMPHETAMINE-DEXTROAMPHETAMINE 10 MG PO TABS
10.0000 mg | ORAL_TABLET | Freq: Two times a day (BID) | ORAL | 0 refills | Status: DC
  Filled 2023-10-13: qty 60, 30d supply, fill #0

## 2023-10-13 NOTE — Telephone Encounter (Signed)
Last seen 08/04/23 and next f/u 02/20/24. Last refilled 08/08/23 #[id number].

## 2023-10-14 ENCOUNTER — Other Ambulatory Visit (HOSPITAL_COMMUNITY): Payer: Self-pay

## 2023-10-14 DIAGNOSIS — J0100 Acute maxillary sinusitis, unspecified: Secondary | ICD-10-CM | POA: Diagnosis not present

## 2023-10-31 NOTE — Progress Notes (Addendum)
[name] [address] [place] [address] Phone: [phone] Subjective:   Cynthia Nichols am a scribe for Dr. Katrinka Blazing.   I'm seeing this patient by the request  of:  Marylen Ponto, MD  CC: Right foot pain and allover pain follow-up  OZH:[id number]  10/05/2023 Patient given injection and tolerated the procedure well.  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  We discussed ways to keep the callus from occurring.  Discussed proper shoes that I think will be beneficial including altra.  Patient given injection today secondary to the severity of pain and was able to walk out of here feeling significantly better.  Discussed icing regimen and home exercises.  Follow-up again in 6 to 8 weeks     Update 11/01/2023 Cynthia Nichols is a 63 y.o. female coming in with complaint of R foot.  Past medical history is significant for multiple sclerosis and has had weakness noted of the lower extremities previously.  Recently seen for a capsulitis of the first MTP.  Given injection in January.  Patient states right foot isn't great today. It has improved but not a lot.        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   Essential hypertension, benign 11/29/2017   Fracture of fifth metatarsal bone 08/13/2015   Greater trochanteric bursitis, left 12/31/2019   Injection given December 31, 2019   Headache(784.0)    Humerus fracture    right with  radial nerve palsy   Hypertension    IBS (irritable bowel syndrome)    Migraine without aura and without status migrainosus, not intractable 02/09/2018   MS (multiple sclerosis) (HCC)    Multiple sclerosis, relapsing-remitting (HCC) 12/14/2011   Osteoporosis 11/29/2017   Other fracture of shaft of right humerus, initial encounter for closed fracture 05/13/2017   Patella fracture 07/03/2015   Hairline oblique     Patellar subluxation, left, initial encounter 08/14/2018   PONV (postoperative nausea and vomiting)    only on this surgery   Radial nerve palsy, 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   Right-sided    Unspecified venous (peripheral) insufficiency 11/09/2011   Venous aneurysm    Right side of neck   Past Surgical History:  Procedure Laterality Date   ABDOMINAL HYSTERECTOMY     BREAST MASS EXCISION     Bilateral breast mass excision ( Benign)   ORIF HUMERUS FRACTURE Right 05/17/2017   RADIOLOGY WITH ANESTHESIA N/A 11/03/2021   Procedure: MRI WITH BRAIN WITH AND WITHOUT,CERVICAL WITH AND WITHOUT CONTRAST;  Surgeon: Radiologist, Medication, MD;  Location: [facility] OR;  Service: Radiology;  Laterality: N/A;   TENNIS ELBOW RELEASE/NIRSCHEL PROCEDURE Right 05/17/2017   Procedure: EXPLORATION AND EXPOSURE OF RADIAL NERVE RIGHT ARM;  Surgeon: Dominica Severin, MD;  Location: [facility] OR;  Service: Orthopedics;  Laterality: Right;   Social History   Socioeconomic History   Marital status:  Married    Spouse name: Zakari Couchman   Number of children: 3   Years of education: Not on file   Highest education level: Associate degree: occupational, Scientist, product/process development, or vocational program  Occupational History   Occupation: disabled    Comment: Charity fundraiser  Tobacco Use   Smoking status: Never   Smokeless tobacco: Never  Vaping Use   Vaping status: Never Used  Substance and Sexual Activity   Alcohol use: No    Alcohol/week: 0.0 standard drinks of alcohol   Drug use: No   Sexual activity:  Yes    Partners: Male  Other Topics Concern   Not on file  Social History Narrative   Not on file   Social Drivers of Health   Financial Resource Strain: Low Risk  (02/08/2019)   Overall Financial Resource Strain (CARDIA)    Difficulty of Paying Living Expenses: Not hard at all  Food Insecurity: No Food Insecurity (02/08/2019)   Hunger Vital Sign    Worried About Running Out of Food in the Last Year: Never true    Ran Out of Food in the Last Year: Never true  Transportation Needs: No Transportation Needs (02/08/2019)   PRAPARE - Administrator, Civil Service (Medical): No    Lack of Transportation (Non-Medical): No  Physical Activity: Sufficiently Active (02/08/2019)   Exercise Vital Sign    Days of Exercise per Week: 7 days    Minutes of Exercise per Session: 30 min  Stress: No Stress Concern Present (02/08/2019)   [employer] of Occupational Health - Occupational Stress Questionnaire    Feeling of Stress : Not at all  Social Connections: Socially Integrated (02/08/2019)   Social Connection and Isolation Panel [NHANES]    Frequency of Communication with Friends and Family: More than three times a week    Frequency of Social Gatherings with Friends and Family: Once a week    Attends Religious Services: More than 4 times per year    Active Member of [employer] or Organizations: Yes    Attends Engineer, structural: More than 4 times per year    Marital Status: Married   Allergies  Allergen Reactions   Ampyra [Dalfampridine] Anaphylaxis and Shortness Of Breath    Chest pain, also    Imitrex [Sumatriptan] Other (See Comments)    Chest tightness   Crestor [Rosuvastatin Calcium] Other (See Comments)    Abdominal pain, shortness of breath   Peanut-Containing Drug Products Itching    Itching in the mouth (remarked that she still eats this in limited amounts)   Shrimp [Shellfish Allergy] Itching    Itching in the mouth (remarked that she still eats this in limited  amounts)   Family History  Adopted: Yes     Current Outpatient Medications (Cardiovascular):    bisoprolol (ZEBETA) 5 MG tablet, Take 1/2 tablet (2.5 mg total) by mouth in the morning and at bedtime.   bisoprolol (ZEBETA) 5 MG tablet, Take 1/2-1 tablet (2.5-5 mg total) by mouth daily for elevated blood pressure   candesartan (ATACAND) 8 MG tablet, Take 1 tablet (8 mg total) by mouth daily. (Patient taking differently: Take 4 mg by mouth daily as needed (Blood pressure).)   candesartan (ATACAND) 8 MG tablet, Take 1 tablet (8 mg total) by mouth daily as needed for blood pressure.   candesartan (ATACAND) 8 MG tablet, Take 1 tablet (8 mg total) by mouth daily as needed for blood pressure - rarely uses   hydrochlorothiazide (MICROZIDE) 12.5 MG  capsule, Take 1 capsule (12.5 mg total) by mouth daily for blood pressure   pravastatin (PRAVACHOL) 20 MG tablet, TAKE 1 TABLET BY MOUTH DAILY (Patient taking differently: Take 20 mg by mouth at bedtime.)   pravastatin (PRAVACHOL) 20 MG tablet, Take 1 tablet (20 mg total) by mouth daily.   pravastatin (PRAVACHOL) 20 MG tablet, Take 1 tablet (20 mg total) by mouth daily.  Current Outpatient Medications (Respiratory):    albuterol (PROVENTIL) (2.5 MG/3ML) 0.083% nebulizer solution, USE 1 VIAL VIA NEBULIZER EVERY 4 TO 6 HOURS AS NEEDED FOR SHORTNESS OF BREATH Sima Matas  Current Outpatient Medications (Analgesics):    naratriptan (AMERGE) 2.5 MG tablet, Take 1 tablet (2.5 mg total) by mouth as directed for Migraine. May take a second dose after 4 hours if needed.   Current Outpatient Medications (Other):    triamcinolone cream (KENALOG) 0.5 %, Apply 1 Application topically 2 (two) times daily. To affected areas.   amphetamine-dextroamphetamine (ADDERALL) 10 MG tablet, Take 1 tablet (10 mg total) by mouth 2 (two) times daily.   buPROPion (WELLBUTRIN) 100 MG tablet, Take 1 tablet (100 mg total) by mouth daily with breakfast.   Cholecalciferol (VITAMIN D) 50  MCG (2000 UT) CAPS, Take 5,000 Units by mouth every 30 (thirty) days.   FLUoxetine (PROZAC) 20 MG capsule, Take 1 capsule (20 mg total) by mouth daily.   gabapentin (NEURONTIN) 300 MG capsule, Take 1 capsule (300 mg total) by mouth 3 (three) times daily.   hydrOXYzine (VISTARIL) 25 MG capsule, Take 1 capsule (25 mg total) by mouth 1 - 2 (two) times daily as needed.   zolpidem (AMBIEN CR) 6.25 MG CR tablet, Take 1 tablet by mouth at bedtime   zolpidem (AMBIEN CR) 6.25 MG CR tablet, Take 1 tablet (6.25 mg total) by mouth at bedtime.   Reviewed prior external information including notes and imaging from  primary care provider As well as notes that were available from care everywhere and other healthcare systems.  Past medical history, social, surgical and family history all reviewed in electronic medical record.  No pertanent information unless stated regarding to the chief complaint.   Review of Systems:  No headache, visual changes, nausea, vomiting, diarrhea, constipation, dizziness, abdominal pain, skin rash, fevers, chills, night sweats, weight loss, swollen lymph nodes, body aches, joint swelling, chest pain, shortness of breath, mood changes. POSITIVE muscle aches  Objective  Blood pressure 128/70, pulse 68, height 5\' 7"  (1.702 m), weight 156 lb 12.8 oz (71.1 kg), SpO2 98%.   General: No apparent distress alert and oriented x3 mood and affect normal, dressed appropriately.  HEENT: Pupils equal, extraocular movements intact  Respiratory: Patient's speak in full sentences and does not appear short of breath  Cardiovascular: No lower extremity edema, non tender, no erythema  Foot exam shows patient does have improvement in the [facility] joint and the movement.  Pain just proximal to the sesamoid bone on the toe.  Patient does have the callus formation noted but is improved overall.   Procedure: Real-time Ultrasound Guided Injection of right  first toe bursa sac plantar Device: GE Logiq  Q7 Ultrasound guided injection is preferred based studies that show increased duration, increased effect, greater accuracy, decreased procedural pain, increased response rate, and decreased cost with ultrasound guided versus blind injection.  Verbal informed consent obtained.  Time-out conducted.  Noted no overlying erythema, induration, or other signs of local infection.  Skin prepped in a sterile fashion.  Local anesthesia: Topical Ethyl chloride.  With sterile  technique and under real time ultrasound guidance: With a 25-gauge half inch needle injected with 0.5 cc of 0.5% Marcaine and 0.5 cc ofKenalog into the bursa sac. Completed without difficulty  Pain immediately resolved suggesting accurate placement of the medication.  Images saved in patient's chart Advised to call if fevers/chills, erythema, induration, drainage, or persistent bleeding.  Impression: Technically successful ultrasound guided injection.   Impression and Recommendations:     The above documentation has been reviewed and is accurate and complete Judi Saa, DO

## 2023-11-01 ENCOUNTER — Ambulatory Visit: Payer: PPO | Admitting: Family Medicine

## 2023-11-01 ENCOUNTER — Other Ambulatory Visit (HOSPITAL_COMMUNITY): Payer: Self-pay

## 2023-11-01 ENCOUNTER — Other Ambulatory Visit: Payer: Self-pay

## 2023-11-01 ENCOUNTER — Encounter: Payer: Self-pay | Admitting: Family Medicine

## 2023-11-01 VITALS — BP 128/70 | HR 68 | Ht 67.0 in | Wt 156.8 lb

## 2023-11-01 DIAGNOSIS — M7751 Other enthesopathy of right foot: Secondary | ICD-10-CM

## 2023-11-01 DIAGNOSIS — M79674 Pain in right toe(s): Secondary | ICD-10-CM

## 2023-11-01 DIAGNOSIS — M79671 Pain in right foot: Secondary | ICD-10-CM

## 2023-11-01 MED ORDER — TRIAMCINOLONE ACETONIDE 0.5 % EX CREA
1.0000 | TOPICAL_CREAM | Freq: Two times a day (BID) | CUTANEOUS | 3 refills | Status: AC
  Filled 2023-11-01: qty 30, 15d supply, fill #0

## 2023-11-01 NOTE — Assessment & Plan Note (Signed)
Patient did have injection today and tolerated the procedure well, discussed icing regimen and home exercises, discussed which activities to do and which ones to avoid.  Increase activity slowly.  Follow-up with me again 6 to 8 weeks otherwise.

## 2023-11-01 NOTE — Patient Instructions (Addendum)
Good to see you. Injection today. Triamcinolone Cream. Return in 6 weeks.

## 2023-11-04 ENCOUNTER — Other Ambulatory Visit (HOSPITAL_COMMUNITY): Payer: Self-pay

## 2023-11-16 ENCOUNTER — Ambulatory Visit: Payer: PPO | Admitting: Family Medicine

## 2023-11-16 DIAGNOSIS — Z01419 Encounter for gynecological examination (general) (routine) without abnormal findings: Secondary | ICD-10-CM | POA: Diagnosis not present

## 2023-11-16 DIAGNOSIS — Z1272 Encounter for screening for malignant neoplasm of vagina: Secondary | ICD-10-CM | POA: Diagnosis not present

## 2023-11-16 DIAGNOSIS — Z1231 Encounter for screening mammogram for malignant neoplasm of breast: Secondary | ICD-10-CM | POA: Diagnosis not present

## 2023-11-16 DIAGNOSIS — M816 Localized osteoporosis [Lequesne]: Secondary | ICD-10-CM | POA: Diagnosis not present

## 2023-11-21 DIAGNOSIS — D225 Melanocytic nevi of trunk: Secondary | ICD-10-CM | POA: Diagnosis not present

## 2023-11-21 DIAGNOSIS — L821 Other seborrheic keratosis: Secondary | ICD-10-CM | POA: Diagnosis not present

## 2023-11-21 DIAGNOSIS — L820 Inflamed seborrheic keratosis: Secondary | ICD-10-CM | POA: Diagnosis not present

## 2023-11-21 DIAGNOSIS — L570 Actinic keratosis: Secondary | ICD-10-CM | POA: Diagnosis not present

## 2023-11-21 DIAGNOSIS — L578 Other skin changes due to chronic exposure to nonionizing radiation: Secondary | ICD-10-CM | POA: Diagnosis not present

## 2023-11-28 DIAGNOSIS — M79671 Pain in right foot: Secondary | ICD-10-CM | POA: Diagnosis not present

## 2023-11-28 DIAGNOSIS — Z6824 Body mass index (BMI) 24.0-24.9, adult: Secondary | ICD-10-CM | POA: Diagnosis not present

## 2023-12-01 ENCOUNTER — Ambulatory Visit: Payer: PPO | Admitting: Neurology

## 2023-12-12 NOTE — Progress Notes (Deleted)
 Cynthia Nichols [address] [place] [address] Phone: [phone] Subjective:    I'm seeing this patient by the request  of:  Cynthia Ponto, MD  CC:   [id number]  10/31/2022 Patient did have injection today and tolerated the procedure well, discussed icing regimen and home exercises, discussed which activities to do and which ones to avoid.  Increase activity slowly.  Follow-up with me again 6 to 8 weeks otherwise.     Update 12/13/2023 Cynthia Nichols is a 63 y.o. female coming in with complaint of R foot pain. 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   Essential hypertension, benign 11/29/2017   Fracture of fifth metatarsal bone 08/13/2015   Greater trochanteric bursitis, left 12/31/2019   Injection given December 31, 2019   Headache(784.0)    Humerus fracture    right with radial nerve palsy   Hypertension    IBS (irritable bowel syndrome)    Migraine without aura and without status migrainosus, not intractable 02/09/2018   MS (multiple sclerosis) (HCC)    Multiple sclerosis, relapsing-remitting (HCC) 12/14/2011   Osteoporosis 11/29/2017   Other fracture of shaft of right humerus, initial encounter for closed fracture 05/13/2017   Patella fracture 07/03/2015   Hairline oblique     Patellar subluxation, left, initial encounter 08/14/2018   PONV (postoperative nausea and vomiting)    only on this surgery   Radial nerve palsy, 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   Right-sided    Unspecified venous (peripheral)  insufficiency 11/09/2011   Venous aneurysm    Right side of neck   Past Surgical History:  Procedure Laterality Date   ABDOMINAL HYSTERECTOMY     BREAST MASS EXCISION     Bilateral breast mass excision ( Benign)   ORIF HUMERUS FRACTURE Right 05/17/2017   RADIOLOGY WITH ANESTHESIA N/A 11/03/2021   Procedure: MRI WITH BRAIN WITH AND WITHOUT,CERVICAL WITH AND WITHOUT CONTRAST;  Surgeon: Radiologist, Medication, MD;  Location: [facility] OR;  Service: Radiology;  Laterality: N/A;   TENNIS ELBOW RELEASE/NIRSCHEL PROCEDURE Right 05/17/2017   Procedure: EXPLORATION AND EXPOSURE OF RADIAL NERVE RIGHT ARM;  Surgeon: Dominica Severin, MD;  Location: [facility] OR;  Service: Orthopedics;  Laterality: Right;   Social History   Socioeconomic History   Marital status: Married    Spouse name: Cynthia Nichols   Number of children: 3   Years of education: Not on file   Highest education level: Associate degree: occupational, Scientist, product/process development, or vocational program  Occupational History   Occupation: disabled    Comment: Charity fundraiser  Tobacco Use   Smoking status: Never   Smokeless tobacco: Never  Vaping Use   Vaping status: Never Used  Substance and Sexual Activity   Alcohol use: No    Alcohol/week: 0.0 standard drinks of alcohol   Drug use: No   Sexual activity: Yes    Partners: Male  Other Topics Concern   Not on file  Social History Narrative   Not on file   Social Drivers  of Health   Financial Resource Strain: Low Risk  (02/08/2019)   Overall Financial Resource Strain (CARDIA)    Difficulty of Paying Living Expenses: Not hard at all  Food Insecurity: No Food Insecurity (02/08/2019)   Hunger Vital Sign    Worried About Running Out of Food in the Last Year: Never true    Ran Out of Food in the Last Year: Never true  Transportation Needs: No Transportation Needs (02/08/2019)   PRAPARE - Administrator, Civil Service (Medical): No    Lack of Transportation (Non-Medical): No  Physical Activity: Sufficiently Active  (02/08/2019)   Exercise Vital Sign    Days of Exercise per Week: 7 days    Minutes of Exercise per Session: 30 min  Stress: No Stress Concern Present (02/08/2019)   [employer] of Occupational Health - Occupational Stress Questionnaire    Feeling of Stress : Not at all  Social Connections: Socially Integrated (02/08/2019)   Social Connection and Isolation Panel [NHANES]    Frequency of Communication with Friends and Family: More than three times a week    Frequency of Social Gatherings with Friends and Family: Once a week    Attends Religious Services: More than 4 times per year    Active Member of [employer] or Organizations: Yes    Attends Engineer, structural: More than 4 times per year    Marital Status: Married   Allergies  Allergen Reactions   Ampyra [Dalfampridine] Anaphylaxis and Shortness Of Breath    Chest pain, also    Imitrex [Sumatriptan] Other (See Comments)    Chest tightness   Crestor [Rosuvastatin Calcium] Other (See Comments)    Abdominal pain, shortness of breath   Peanut-Containing Drug Products Itching    Itching in the mouth (remarked that she still eats this in limited amounts)   Shrimp [Shellfish Allergy] Itching    Itching in the mouth (remarked that she still eats this in limited amounts)   Family History  Adopted: Yes     Current Outpatient Medications (Cardiovascular):    bisoprolol (ZEBETA) 5 MG tablet, Take 1/2 tablet (2.5 mg total) by mouth in the morning and at bedtime.   bisoprolol (ZEBETA) 5 MG tablet, Take 1/2-1 tablet (2.5-5 mg total) by mouth daily for elevated blood pressure   candesartan (ATACAND) 8 MG tablet, Take 1 tablet (8 mg total) by mouth daily. (Patient taking differently: Take 4 mg by mouth daily as needed (Blood pressure).)   candesartan (ATACAND) 8 MG tablet, Take 1 tablet (8 mg total) by mouth daily as needed for blood pressure.   candesartan (ATACAND) 8 MG tablet, Take 1 tablet (8 mg total) by mouth daily as needed for  blood pressure - rarely uses   hydrochlorothiazide (MICROZIDE) 12.5 MG capsule, Take 1 capsule (12.5 mg total) by mouth daily for blood pressure   pravastatin (PRAVACHOL) 20 MG tablet, TAKE 1 TABLET BY MOUTH DAILY (Patient taking differently: Take 20 mg by mouth at bedtime.)   pravastatin (PRAVACHOL) 20 MG tablet, Take 1 tablet (20 mg total) by mouth daily.   pravastatin (PRAVACHOL) 20 MG tablet, Take 1 tablet (20 mg total) by mouth daily.  Current Outpatient Medications (Respiratory):    albuterol (PROVENTIL) (2.5 MG/3ML) 0.083% nebulizer solution, USE 1 VIAL VIA NEBULIZER EVERY 4 TO 6 HOURS AS NEEDED FOR SHORTNESS OF BREATH Sima Matas  Current Outpatient Medications (Analgesics):    naratriptan (AMERGE) 2.5 MG tablet, Take 1 tablet (2.5 mg total) by mouth as directed for  Migraine. May take a second dose after 4 hours if needed.   Current Outpatient Medications (Other):    amphetamine-dextroamphetamine (ADDERALL) 10 MG tablet, Take 1 tablet (10 mg total) by mouth 2 (two) times daily.   buPROPion (WELLBUTRIN) 100 MG tablet, Take 1 tablet (100 mg total) by mouth daily with breakfast.   Cholecalciferol (VITAMIN D) 50 MCG (2000 UT) CAPS, Take 5,000 Units by mouth every 30 (thirty) days.   FLUoxetine (PROZAC) 20 MG capsule, Take 1 capsule (20 mg total) by mouth daily.   gabapentin (NEURONTIN) 300 MG capsule, Take 1 capsule (300 mg total) by mouth 3 (three) times daily.   hydrOXYzine (VISTARIL) 25 MG capsule, Take 1 capsule (25 mg total) by mouth 1 - 2 (two) times daily as needed.   triamcinolone cream (KENALOG) 0.5 %, Apply 1 Application topically 2 (two) times daily. To affected areas.   zolpidem (AMBIEN CR) 6.25 MG CR tablet, Take 1 tablet by mouth at bedtime   zolpidem (AMBIEN CR) 6.25 MG CR tablet, Take 1 tablet (6.25 mg total) by mouth at bedtime.   Reviewed prior external information including notes and imaging from  primary care provider As well as notes that were available from care  everywhere and other healthcare systems.  Past medical history, social, surgical and family history all reviewed in electronic medical record.  No pertanent information unless stated regarding to the chief complaint.   Review of Systems:  No headache, visual changes, nausea, vomiting, diarrhea, constipation, dizziness, abdominal pain, skin rash, fevers, chills, night sweats, weight loss, swollen lymph nodes, body aches, joint swelling, chest pain, shortness of breath, mood changes. POSITIVE muscle aches  Objective  There were no vitals taken for this visit.   General: No apparent distress alert and oriented x3 mood and affect normal, dressed appropriately.  HEENT: Pupils equal, extraocular movements intact  Respiratory: Patient's speak in full sentences and does not appear short of breath  Cardiovascular: No lower extremity edema, non tender, no erythema      Impression and Recommendations:

## 2023-12-13 ENCOUNTER — Ambulatory Visit: Payer: PPO | Admitting: Family Medicine

## 2023-12-14 ENCOUNTER — Other Ambulatory Visit: Payer: Self-pay

## 2023-12-14 ENCOUNTER — Other Ambulatory Visit (HOSPITAL_COMMUNITY): Payer: Self-pay

## 2023-12-29 ENCOUNTER — Other Ambulatory Visit (HOSPITAL_COMMUNITY): Payer: Self-pay

## 2024-01-05 ENCOUNTER — Other Ambulatory Visit: Payer: Self-pay

## 2024-01-05 ENCOUNTER — Other Ambulatory Visit (HOSPITAL_COMMUNITY): Payer: Self-pay

## 2024-01-05 ENCOUNTER — Encounter: Payer: Self-pay | Admitting: Family Medicine

## 2024-01-05 ENCOUNTER — Other Ambulatory Visit: Payer: Self-pay | Admitting: Neurology

## 2024-01-05 MED ORDER — AMPHETAMINE-DEXTROAMPHETAMINE 10 MG PO TABS
10.0000 mg | ORAL_TABLET | Freq: Two times a day (BID) | ORAL | 0 refills | Status: DC
Start: 2024-01-05 — End: 2024-02-20
  Filled 2024-01-05: qty 60, 30d supply, fill #0

## 2024-01-05 NOTE — Telephone Encounter (Signed)
 Last seen 08/04/23 and next f/u 02/20/24. Last refilled 10/18/23 #[id number].

## 2024-02-06 ENCOUNTER — Other Ambulatory Visit (HOSPITAL_COMMUNITY): Payer: Self-pay

## 2024-02-06 DIAGNOSIS — M654 Radial styloid tenosynovitis [de Quervain]: Secondary | ICD-10-CM | POA: Diagnosis not present

## 2024-02-06 MED ORDER — METHYLPREDNISOLONE 4 MG PO TBPK
ORAL_TABLET | ORAL | 0 refills | Status: AC
  Filled 2024-02-06: qty 21, 6d supply, fill #0

## 2024-02-07 DIAGNOSIS — M216X1 Other acquired deformities of right foot: Secondary | ICD-10-CM | POA: Diagnosis not present

## 2024-02-07 DIAGNOSIS — M25871 Other specified joint disorders, right ankle and foot: Secondary | ICD-10-CM | POA: Insufficient documentation

## 2024-02-07 DIAGNOSIS — M21371 Foot drop, right foot: Secondary | ICD-10-CM | POA: Diagnosis not present

## 2024-02-20 ENCOUNTER — Encounter: Payer: Self-pay | Admitting: Neurology

## 2024-02-20 ENCOUNTER — Ambulatory Visit: Payer: PPO | Admitting: Neurology

## 2024-02-20 ENCOUNTER — Other Ambulatory Visit (HOSPITAL_COMMUNITY): Payer: Self-pay

## 2024-02-20 VITALS — BP 125/77 | HR 70 | Ht 67.0 in | Wt 157.5 lb

## 2024-02-20 DIAGNOSIS — G35 Multiple sclerosis: Secondary | ICD-10-CM | POA: Diagnosis not present

## 2024-02-20 DIAGNOSIS — F988 Other specified behavioral and emotional disorders with onset usually occurring in childhood and adolescence: Secondary | ICD-10-CM

## 2024-02-20 DIAGNOSIS — R3915 Urgency of urination: Secondary | ICD-10-CM

## 2024-02-20 DIAGNOSIS — G43009 Migraine without aura, not intractable, without status migrainosus: Secondary | ICD-10-CM | POA: Diagnosis not present

## 2024-02-20 DIAGNOSIS — Z79899 Other long term (current) drug therapy: Secondary | ICD-10-CM | POA: Diagnosis not present

## 2024-02-20 DIAGNOSIS — R269 Unspecified abnormalities of gait and mobility: Secondary | ICD-10-CM | POA: Diagnosis not present

## 2024-02-20 MED ORDER — AMPHETAMINE-DEXTROAMPHETAMINE 10 MG PO TABS
10.0000 mg | ORAL_TABLET | Freq: Two times a day (BID) | ORAL | 0 refills | Status: DC
  Filled 2024-02-20: qty 60, 30d supply, fill #0

## 2024-02-20 NOTE — Progress Notes (Signed)
 [facility]  PATIENT: Cynthia Nichols DOB: 03/21/1961  REFERRING DOCTOR OR PCP: Babetta Lesch, MD ([facility]); Randol Butt, FNP (PCP) SOURCE: Patient, notes from neurology, imaging and lab reports.  _________________________________   HISTORICAL  CHIEF COMPLAINT:  Chief Complaint  Patient presents with   RM10/MS    Pt is here Alone. Pt states that she has good days and bad days.     HISTORY OF PRESENT ILLNESS:  Cynthia Nichols is a 63 y.o. woman with multiple sclerosis.  She had no other exacerbations.     Update 02/20/2024: She had the first year of Mavenclad was initiated late June 2022 and second month 04/12/21 - 04/16/21.    She felt very tired and achy the second week but was fine a few days later.      Lymphocytes were 0.6 in 05/2021 and 0.8 10/07/2020 and 0.9 02/17/2022   She never got the MRI last year we ordered (very claustophobic).   She did the second year of Mavenclad  treatment 05/30/22-06/03/2022 and October.    The lymphocytes  were 0.3 08/11/2022 and 0.7 on  11/29/2022.   She reports reduced strength in her legs, R worse than left.   She sees a podiatrist but the recommended shoes were heavy.   Her right leg is weaker than left.  She uses her right AFO for long walks only .  She uses  a cane outside her home.   She has some spasticity in her right leg.   She has numbness and tingling in her feet.  She has itching  She has urge incontinence / nocturia.   Oxybutynin was poorly tolerated so she stopped   She would prefer not to take another pill (we had discussed Myrbetriq ).   She has 2x nocturia.    She has fatigue helped a bit by Adderall  - she is just taking one 10 mg in am and we discussed taking asecond one a few hours later.    She sleeps fairly well on Ambien  CR 6.25 mg nightly (6-8 hours).  .Leg cramps are better with potassium.    She has anxiety > depression.   She notes has cognitive issue.  Word finding is difficult at times and mild  difficulties with STM.   She has trouble with parallel processing.     Potassium has been low so she takes a supplement.   She is on HCTZ.    She has LBP, worse with prolonged standing.    MRI lumbar in 2018 was normal by report.   She broke her right humerus in a fall in 2018.   Also has had right ankle fracture.    She broke her ankle 2024, she feels due to reduced balance on a slick surface.   Her foot slid.  She could not catch herself.      She has migraine headaches are doinw ell and she stopped zonisamide  and Emgality ,   She has non-migraine headaches more than migraine  MS History:  She had the onset of right leg weakness one day in 2011.  Symptoms did not improve.  However, she began to have bladder issues a few years earlier.    She initially was seen at [facility].  MRI of the cervical spine 11/12/2009 showed only the one spot at [facility].   She had EP studies (normal) and CSF analysis which was reporgtedly c/w MS.   She was placed on Copaxone.  She had episodes of diplopia which were  fleeting.   She was switched to Tecfidera  bit had trouble tolerating it.   After several years, she switched to Tysabri .   She felt great after the first dose but felt poorly after the second dose so she stopped.   About 2 years ago, she started Mayzent but stopped after 3 months due to feeling poorly.   Though no relapses after she stopped, gait has slowly worsened.  Mavenclad was initiated in June 2022.     Second year in 2023   Imaging reports (actual images have been requested but were not available at the time of the visit): MRI of the brain report 06/14/2019 "No significant change nonenhancing white matter foci compatible with multiple sclerosis. No new lesion"  MRI of the cervical spine 06/14/2019 report "T2 signal: Dorsal lateral right at C1 stable. Right ventrolateral. C4 Which may or may not have been present previously. Bilateral ventral cord left greater than right as seen previously adjacent to extrusion as  per below, question right progression versus artifact... Right lateral C6 stable.. Large longitudinal focus left  T2 stable on sagittal images.    Degenerative change with a large disc osteophyte complex at C5-C6 to the right with moderate bilateral foraminal narrowing and moderate spinal stenosis.  milder problems at other levels.  MRI of the brain 04/06/2023 ([facility]) showed multiple foci in the white matter consistent with chronic demyelination.  There were no new lesions compared to the MRI from 06/19/2019.   MRI of the cervical spine 04/06/2023 ([facility]) showed Redemonstrated multifocal white matter lesions within the cervical spine without enhancement to suggest active demyelination. No new lesions.     Redemonstrated moderate spinal canal stenosis at C5-C6 secondary to retrolisthesis and disc bulge/osteophyte complex. There is unchanged abnormal T2 signal within the anterior cord at this level that is favored to represent a chronic demyelinating plaque.    REVIEW OF SYSTEMS: Constitutional: No fevers, chills, sweats, or change in appetite Eyes: No visual changes, double vision, eye pain Ear, nose and throat: No hearing loss, ear pain, nasal congestion, sore throat Cardiovascular: No chest pain, palpitations Respiratory:  No shortness of breath at rest or with exertion.   No wheezes GastrointestinaI: No nausea, vomiting, diarrhea, abdominal pain, fecal incontinence Genitourinary:  No dysuria, urinary retention or frequency.  No nocturia. Musculoskeletal:  No neck pain, back pain Integumentary: No rash, pruritus, skin lesions Neurological: as above Psychiatric: No depression at this time.  No anxiety Endocrine: No palpitations, diaphoresis, change in appetite, change in weigh or increased thirst Hematologic/Lymphatic:  No anemia, purpura, petechiae. Allergic/Immunologic: No itchy/runny eyes, nasal congestion, recent allergic reactions, rashes  ALLERGIES: Allergies  Allergen Reactions   Ampyra  [Dalfampridine] Anaphylaxis and Shortness Of Breath    Chest pain, also    Imitrex [Sumatriptan] Other (See Comments)    Chest tightness   Crestor  [Rosuvastatin  Calcium ] Other (See Comments)    Abdominal pain, shortness of breath   Peanut-Containing Drug Products Itching    Itching in the mouth (remarked that she still eats this in limited amounts)   Shrimp [Shellfish Allergy] Itching    Itching in the mouth (remarked that she still eats this in limited amounts)    HOME MEDICATIONS:  Current Outpatient Medications:    bisoprolol  (ZEBETA ) 5 MG tablet, Take 1/2-1 tablet (2.5-5 mg total) by mouth daily for elevated blood pressure, Disp: 90 tablet, Rfl: 3   buPROPion  (WELLBUTRIN ) 100 MG tablet, Take 1 tablet (100 mg total) by mouth daily with breakfast., Disp: 90 tablet, Rfl:  3   candesartan  (ATACAND ) 8 MG tablet, Take 1 tablet (8 mg total) by mouth daily as needed for blood pressure - rarely uses, Disp: 90 tablet, Rfl: 1   Cholecalciferol (VITAMIN D ) 50 MCG (2000 UT) CAPS, Take 5,000 Units by mouth every 30 (thirty) days., Disp: , Rfl:    FLUoxetine  (PROZAC ) 20 MG capsule, Take 1 capsule (20 mg total) by mouth daily., Disp: 90 capsule, Rfl: 3   gabapentin  (NEURONTIN ) 300 MG capsule, Take 1 capsule (300 mg total) by mouth 3 (three) times daily., Disp: 270 capsule, Rfl: 3   hydrochlorothiazide  (MICROZIDE ) 12.5 MG capsule, Take 1 capsule (12.5 mg total) by mouth daily for blood pressure, Disp: 90 capsule, Rfl: 3   hydrOXYzine  (VISTARIL ) 25 MG capsule, Take 1 capsule (25 mg total) by mouth 1 - 2 (two) times daily as needed., Disp: 60 capsule, Rfl: 5   pravastatin  (PRAVACHOL ) 20 MG tablet, Take 1 tablet (20 mg total) by mouth daily., Disp: 90 tablet, Rfl: 3   zolpidem  (AMBIEN  CR) 6.25 MG CR tablet, Take 1 tablet (6.25 mg total) by mouth at bedtime., Disp: 90 tablet, Rfl: 1   albuterol  (PROVENTIL ) (2.5 MG/3ML) 0.083% nebulizer solution, USE 1 VIAL VIA NEBULIZER EVERY 4 TO 6 HOURS AS NEEDED FOR  SHORTNESS OF BREATH Ula Gambler, Disp: 225 mL, Rfl: 0   amphetamine -dextroamphetamine  (ADDERALL ) 10 MG tablet, Take 1 tablet (10 mg total) by mouth 2 (two) times daily., Disp: 60 tablet, Rfl: 0   bisoprolol  (ZEBETA ) 5 MG tablet, Take 1/2 tablet (2.5 mg total) by mouth in the morning and at bedtime. (Patient not taking: Reported on 02/20/2024), Disp: 90 tablet, Rfl: 2   candesartan  (ATACAND ) 8 MG tablet, Take 1 tablet (8 mg total) by mouth daily. (Patient not taking: Reported on 02/20/2024), Disp: 90 tablet, Rfl: 1   candesartan  (ATACAND ) 8 MG tablet, Take 1 tablet (8 mg total) by mouth daily as needed for blood pressure. (Patient not taking: Reported on 02/20/2024), Disp: 90 tablet, Rfl: 1   naratriptan  (AMERGE) 2.5 MG tablet, Take 1 tablet (2.5 mg total) by mouth as directed for Migraine. May take a second dose after 4 hours if needed. (Patient not taking: Reported on 02/20/2024), Disp: 10 tablet, Rfl: 11   pravastatin  (PRAVACHOL ) 20 MG tablet, TAKE 1 TABLET BY MOUTH DAILY (Patient taking differently: Take 20 mg by mouth at bedtime.), Disp: 30 tablet, Rfl: 6   pravastatin  (PRAVACHOL ) 20 MG tablet, Take 1 tablet (20 mg total) by mouth daily. (Patient not taking: Reported on 02/20/2024), Disp: 90 tablet, Rfl: 1   triamcinolone  cream (KENALOG ) 0.5 %, Apply 1 Application topically 2 (two) times daily. To affected areas. 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   Aortic atherosclerosis (HCC) 11/29/2017   Asthma    Asthma 11/02/2019   Closed fracture of shaft of right humerus 05/13/2017   Closed low lateral malleolus fracture, left, initial encounter 10/17/2018   COVID 07/2021   mild case    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   Essential hypertension, benign 11/29/2017   Fracture of fifth metatarsal bone 08/13/2015   Greater trochanteric bursitis, left 12/31/2019   Injection given  December 31, 2019   Headache(784.0)    Humerus fracture    right with radial nerve palsy   Hypertension    IBS (irritable bowel syndrome)    Migraine without aura and without status migrainosus, not intractable 02/09/2018   MS (multiple sclerosis) (HCC)    Multiple sclerosis, relapsing-remitting (HCC) 12/14/2011   Osteoporosis 11/29/2017   Other fracture of shaft of right humerus, initial encounter for closed fracture 05/13/2017   Patella fracture 07/03/2015   Hairline oblique     Patellar subluxation, left, initial encounter 08/14/2018   PONV (postoperative nausea and vomiting)    only on this surgery   Radial nerve palsy, 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   Right-sided    Unspecified venous (peripheral) insufficiency 11/09/2011   Venous aneurysm    Right side of neck    PAST SURGICAL HISTORY: Past Surgical History:  Procedure Laterality Date   ABDOMINAL HYSTERECTOMY     BREAST MASS EXCISION     Bilateral breast mass excision ( Benign)   ORIF HUMERUS FRACTURE Right 05/17/2017   RADIOLOGY WITH ANESTHESIA N/A 11/03/2021   Procedure: MRI WITH BRAIN WITH AND WITHOUT,CERVICAL WITH AND WITHOUT CONTRAST;  Surgeon: Radiologist, Medication, MD;  Location: [facility] OR;  Service: Radiology;  Laterality: N/A;   TENNIS ELBOW RELEASE/NIRSCHEL PROCEDURE Right 05/17/2017   Procedure: EXPLORATION AND EXPOSURE OF RADIAL NERVE RIGHT ARM;  Surgeon: Ronn Cohn, MD;  Location: [facility] OR;  Service: Orthopedics;  Laterality: Right;    FAMILY HISTORY: Family History  Adopted: Yes    SOCIAL HISTORY:  Social History   Socioeconomic History   Marital status: Married    Spouse name: Staisha Winiarski   Number of children: 3   Years of education: Not on file   Highest education  level: Associate degree: occupational, Scientist, product/process development, or vocational program  Occupational History   Occupation: disabled    Comment: Charity fundraiser  Tobacco Use   Smoking status: Never   Smokeless tobacco: Never  Vaping Use   Vaping status: Never Used  Substance and Sexual Activity   Alcohol use: No    Alcohol/week: 0.0 standard drinks of alcohol   Drug use: No   Sexual activity: Yes    Partners: Male  Other Topics Concern   Not on file  Social History Narrative   Not on file   Social Drivers of Health   Financial Resource Strain: Low Risk  (02/08/2019)   Overall Financial Resource Strain (CARDIA)    Difficulty of Paying Living Expenses: Not hard at all  Food Insecurity: No Food Insecurity (02/08/2019)   Hunger Vital Sign    Worried About Running Out of Food in the Last Year: Never true    Ran Out of Food in the Last Year: Never true  Transportation Needs: No Transportation Needs (02/08/2019)   PRAPARE - Administrator, Civil Service (Medical): No    Lack of Transportation (Non-Medical): No  Physical Activity: Sufficiently Active (02/08/2019)   Exercise Vital Sign    Days of Exercise per Week: 7 days    Minutes of Exercise per Session: 30 min  Stress: No Stress Concern Present (02/08/2019)   [employer] of Occupational Health - Occupational Stress Questionnaire    Feeling of Stress : Not at all  Social Connections: Socially Integrated (02/08/2019)   Social Connection and Isolation Panel [NHANES]    Frequency of Communication with Friends and Family: More than three times a week    Frequency of Social Gatherings with  Friends and Family: Once a week    Attends Religious Services: More than 4 times per year    Active Member of Clubs or Organizations: Yes    Attends Engineer, structural: More than 4 times per year    Marital Status: Married  Catering manager Violence: Not At Risk (02/08/2019)   Humiliation, Afraid, Rape, and Kick questionnaire    Fear of Current or  Ex-Partner: No    Emotionally Abused: No    Physically Abused: No    Sexually Abused: No     PHYSICAL EXAM  Vitals:   02/20/24 1146  BP: 125/77  Pulse: 70  Weight: 157 lb 8 oz (71.4 kg)  Height: 5\' 7"  (1.702 m)    Body mass index is 24.67 kg/m.   General: The patient is well-developed and well-nourished and in no acute distress  HEENT:  Head is [place]/AT.  Sclera are anicteric.    Neck: the neck is nontender.  Skin: Extremities are without rash or  edema.  Musculoskeletal:  Back is nontender  Neurologic Exam  Mental status: The patient is alert and oriented x 3 at the time of the examination. The patient has apparent normal recent and remote memory, with an apparently normal attention span and concentration ability.   Speech is normal.  Cranial nerves: Extraocular movements are full.  Facial strength appeared normal.  No dysarthria.  No obvious hearing deficits are noted.  Motor:  Muscle bulk is normal.   Tone is increased in the right leg.   Strength is 3/5 right hip flexors, 4+/5 quads, 4/5 hamstrings and 2+/5 toe/ankle extension.   Left leg is 4+/5 hip flexors, 4/5 ankle/toe extensors and 5/5 elsewhere.  Strength is  5 / 5 arms.   Sensory: Sensory testing is intact to pinprick, soft touch and vibration sensation in all 4 extremities.  Coordination: Cerebellar testing reveals good finger-nose-finger and reduced heel to shin worse on right  Gait and station: Station is normal.   She has a wide based gait with a right > left foot drop.  Unable to do a tandem walk the Romberg sign was negative  Reflexes: Deep tendon reflexes are symmetric and normal in arms, slightly increased right leg.       DIAGNOSTIC DATA (LABS, IMAGING, TESTING) - I reviewed patient records, labs, notes, testing and imaging myself where available.  Lab Results  Component Value Date   WBC 4.1 11/29/2022   HGB 13.5 11/29/2022   HCT 39.5 11/29/2022   MCV 92 11/29/2022   PLT 194 11/29/2022       Component Value Date/Time   NA 144 11/29/2022 1401   K 3.6 11/29/2022 1401   CL 102 11/29/2022 1401   CO2 26 11/29/2022 1401   GLUCOSE 90 11/29/2022 1401   GLUCOSE 120 (H) 07/11/2020 1708   BUN 13 11/29/2022 1401   CREATININE 0.78 11/29/2022 1401   CREATININE 0.59 02/09/2018 0930   CALCIUM  9.7 11/29/2022 1401   PROT 7.0 11/29/2022 1401   ALBUMIN 4.4 11/29/2022 1401   AST 27 11/29/2022 1401   ALT 32 11/29/2022 1401   ALKPHOS 97 11/29/2022 1401   BILITOT 0.8 11/29/2022 1401   GFRNONAA >60 07/11/2020 1708   GFRNONAA 84 12/14/2017 0945   GFRAA 98 12/14/2017 0945    Lab Results  Component Value Date   TSH 1.07 03/09/2017       ASSESSMENT AND PLAN  Multiple sclerosis, relapsing-remitting (HCC)  High risk medication use  Attention deficit disorder (ADD) without  hyperactivity  Urinary urgency  Migraine without aura and without status migrainosus, not intractable  Gait disturbance   She has completed Mavenclad .  Labs with PCP were normal in 2024.     We discussed the natural history of MS.  She has had no recent exacerbation or progression.   Has had some very slow progression likely SPMS changes mixed with aging.  If one of the BTK inhibitors becomes available this could be considered For migraines will take Amerge (if not covered by insurance, change to Relpax 20 mg). Renew Adderall  for MS related ADD and fatigue Anxiety is better on fluoxetine .  rtc 6 months or sooner if new or worsening issues  This visit is part of a comprehensive longitudinal care medical relationship regarding the patients primary diagnosis of MS and related concerns.    Tremane Spurgeon A. Godwin Lat, MD, [facility] 02/20/2024, 12:56 PM Certified in Neurology, Clinical Neurophysiology, Sleep Medicine and Neuroimaging  [facility] [address], [address] [place], [place] [address] [phone]

## 2024-04-05 ENCOUNTER — Other Ambulatory Visit: Payer: Self-pay

## 2024-04-05 ENCOUNTER — Other Ambulatory Visit (HOSPITAL_COMMUNITY): Payer: Self-pay

## 2024-04-05 MED ORDER — ZOLPIDEM TARTRATE ER 6.25 MG PO TBCR
6.2500 mg | EXTENDED_RELEASE_TABLET | Freq: Every day | ORAL | 1 refills | Status: DC
  Filled 2024-04-05: qty 10, 10d supply, fill #0
  Filled 2024-04-05: qty 80, 80d supply, fill #0
  Filled 2024-07-03: qty 90, 90d supply, fill #0

## 2024-04-10 DIAGNOSIS — M21371 Foot drop, right foot: Secondary | ICD-10-CM | POA: Diagnosis not present

## 2024-04-10 DIAGNOSIS — M216X1 Other acquired deformities of right foot: Secondary | ICD-10-CM | POA: Diagnosis not present

## 2024-04-10 DIAGNOSIS — M25871 Other specified joint disorders, right ankle and foot: Secondary | ICD-10-CM | POA: Diagnosis not present

## 2024-04-17 ENCOUNTER — Other Ambulatory Visit: Payer: Self-pay

## 2024-04-17 ENCOUNTER — Other Ambulatory Visit (HOSPITAL_COMMUNITY): Payer: Self-pay

## 2024-04-18 ENCOUNTER — Other Ambulatory Visit: Payer: Self-pay

## 2024-04-19 ENCOUNTER — Other Ambulatory Visit (HOSPITAL_COMMUNITY): Payer: Self-pay

## 2024-04-19 ENCOUNTER — Encounter: Payer: Self-pay | Admitting: Neurology

## 2024-04-19 MED ORDER — AMPHETAMINE-DEXTROAMPHETAMINE 10 MG PO TABS
10.0000 mg | ORAL_TABLET | Freq: Two times a day (BID) | ORAL | 0 refills | Status: DC
  Filled 2024-04-19: qty 60, 30d supply, fill #0

## 2024-04-19 NOTE — Telephone Encounter (Signed)
 Last seen on 02/20/24 Follow up scheduled on 09/05/24   Dispensed Days Supply Quantity Provider Pharmacy  amphetamine -dextroamphetamine  (ADDERALL ) 10 MG tablet 02/28/2024 30 60 tablet Sater, Charlie LABOR, MD [place] [facility]...     Rx pending to be signed

## 2024-05-02 DIAGNOSIS — M654 Radial styloid tenosynovitis [de Quervain]: Secondary | ICD-10-CM | POA: Diagnosis not present

## 2024-05-02 DIAGNOSIS — G5622 Lesion of ulnar nerve, left upper limb: Secondary | ICD-10-CM | POA: Diagnosis not present

## 2024-05-02 DIAGNOSIS — M65342 Trigger finger, left ring finger: Secondary | ICD-10-CM | POA: Diagnosis not present

## 2024-05-02 DIAGNOSIS — G5602 Carpal tunnel syndrome, left upper limb: Secondary | ICD-10-CM | POA: Diagnosis not present

## 2024-05-14 ENCOUNTER — Other Ambulatory Visit (HOSPITAL_COMMUNITY): Payer: Self-pay

## 2024-05-24 DIAGNOSIS — Z6824 Body mass index (BMI) 24.0-24.9, adult: Secondary | ICD-10-CM | POA: Diagnosis not present

## 2024-05-24 DIAGNOSIS — M898X9 Other specified disorders of bone, unspecified site: Secondary | ICD-10-CM | POA: Diagnosis not present

## 2024-06-13 ENCOUNTER — Other Ambulatory Visit: Payer: Self-pay

## 2024-06-13 ENCOUNTER — Other Ambulatory Visit (HOSPITAL_COMMUNITY): Payer: Self-pay

## 2024-06-15 ENCOUNTER — Other Ambulatory Visit (HOSPITAL_COMMUNITY): Payer: Self-pay

## 2024-06-15 ENCOUNTER — Encounter (HOSPITAL_COMMUNITY): Payer: Self-pay

## 2024-07-03 ENCOUNTER — Other Ambulatory Visit: Payer: Self-pay | Admitting: Neurology

## 2024-07-03 ENCOUNTER — Other Ambulatory Visit (HOSPITAL_COMMUNITY): Payer: Self-pay

## 2024-07-03 ENCOUNTER — Other Ambulatory Visit: Payer: Self-pay

## 2024-07-03 MED ORDER — AMPHETAMINE-DEXTROAMPHETAMINE 10 MG PO TABS
10.0000 mg | ORAL_TABLET | Freq: Two times a day (BID) | ORAL | 0 refills | Status: DC
  Filled 2024-07-03: qty 60, 30d supply, fill #0

## 2024-07-03 NOTE — Telephone Encounter (Signed)
 Last seen on 02/20/24 Follow up scheduled 09/05/24   Dispensed Days Supply Quantity Provider Pharmacy  amphetamine -dextroamphetamine  (ADDERALL ) 10 MG tablet 04/20/2024 30 60 tablet Sater, Charlie LABOR, MD [place] [facility]     Rx pending to be signed

## 2024-07-16 DIAGNOSIS — H2513 Age-related nuclear cataract, bilateral: Secondary | ICD-10-CM | POA: Diagnosis not present

## 2024-07-17 NOTE — Progress Notes (Unsigned)
 [facility] [address] [place] [address] Phone: [phone] Subjective:   ISusannah Nichols, am serving as a scribe for Dr. Arthea Nichols.  I'm seeing this patient by the request  of:  Cynthia Marcellus RAMAN, MD  CC: Left wrist pain  YEP:Dlagzrupcz  Cynthia Nichols is a 63 y.o. female coming in with complaint of l wrist pain. Last seen in February 2025 for R foot pain. Patient states L wrist pain that has been going on for months. Last injection 08/2023.       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   Essential hypertension, benign 11/29/2017   Fracture of fifth metatarsal bone 08/13/2015   Greater trochanteric bursitis, left 12/31/2019   Injection given December 31, 2019   Headache(784.0)    Humerus fracture    right with radial nerve palsy   Hypertension    IBS (irritable bowel syndrome)    Migraine without aura and without status migrainosus, not intractable 02/09/2018   MS (multiple sclerosis)    Multiple sclerosis, relapsing-remitting 12/14/2011   Osteoporosis 11/29/2017   Other fracture of shaft of right humerus, initial encounter for closed fracture 05/13/2017   Patella fracture 07/03/2015   Hairline oblique     Patellar subluxation, left, initial encounter 08/14/2018   PONV (postoperative nausea and vomiting)    only on this surgery   Radial nerve palsy, 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   Right-sided    Unspecified venous (peripheral) insufficiency 11/09/2011   Venous aneurysm    Right side of neck   Past Surgical History:  Procedure Laterality  Date   ABDOMINAL HYSTERECTOMY     BREAST MASS EXCISION     Bilateral breast mass excision ( Benign)   ORIF HUMERUS FRACTURE Right 05/17/2017   RADIOLOGY WITH ANESTHESIA N/A 11/03/2021   Procedure: MRI WITH BRAIN WITH AND WITHOUT,CERVICAL WITH AND WITHOUT CONTRAST;  Surgeon: Radiologist, Medication, MD;  Location: [facility] OR;  Service: Radiology;  Laterality: N/A;   TENNIS ELBOW RELEASE/NIRSCHEL PROCEDURE Right 05/17/2017   Procedure: EXPLORATION AND EXPOSURE OF RADIAL NERVE RIGHT ARM;  Surgeon: Camella Fallow, MD;  Location: [facility] OR;  Service: Orthopedics;  Laterality: Right;   Social History   Socioeconomic History   Marital status: Married    Spouse name: Wilhelmenia Addis   Number of children: 3   Years of education: Not on file   Highest education level: Associate degree: occupational, scientist, product/process development, or vocational program  Occupational History   Occupation: disabled    Comment: [employer]  Tobacco Use   Smoking status: Never   Smokeless tobacco: Never  Vaping Use   Vaping status: Never Used  Substance and Sexual Activity   Alcohol use: No    Alcohol/week: 0.0 standard drinks of alcohol   Drug use: No   Sexual activity: Yes    Partners: Male  Other Topics Concern   Not on file  Social History Narrative   Not on file   Social Drivers of Health   Financial Resource Strain: Low Risk  (02/08/2019)   Overall Financial Resource  Strain (CARDIA)    Difficulty of Paying Living Expenses: Not hard at all  Food Insecurity: No Food Insecurity (02/08/2019)   Hunger Vital Sign    Worried About Running Out of Food in the Last Year: Never true    Ran Out of Food in the Last Year: Never true  Transportation Needs: No Transportation Needs (02/08/2019)   PRAPARE - Administrator, Civil Service (Medical): No    Lack of Transportation (Non-Medical): No  Physical Activity: Sufficiently Active (02/08/2019)   Exercise Vital Sign    Days of Exercise per Week: 7 days    Minutes of Exercise per Session: 30 min   Stress: No Stress Concern Present (02/08/2019)   [employer] of Occupational Health - Occupational Stress Questionnaire    Feeling of Stress : Not at all  Social Connections: Socially Integrated (02/08/2019)   Social Connection and Isolation Panel    Frequency of Communication with Friends and Family: More than three times a week    Frequency of Social Gatherings with Friends and Family: Once a week    Attends Religious Services: More than 4 times per year    Active Member of [employer] or Organizations: Yes    Attends Engineer, Structural: More than 4 times per year    Marital Status: Married   Allergies  Allergen Reactions   Ampyra [Dalfampridine] Anaphylaxis and Shortness Of Breath    Chest pain, also    Imitrex [Sumatriptan] Other (See Comments)    Chest tightness   Crestor  [Rosuvastatin  Calcium ] Other (See Comments)    Abdominal pain, shortness of breath   Peanut-Containing Drug Products Itching    Itching in the mouth (remarked that she still eats this in limited amounts)   Shrimp [Shellfish Allergy] Itching    Itching in the mouth (remarked that she still eats this in limited amounts)   Family History  Adopted: Yes     Current Outpatient Medications (Cardiovascular):    bisoprolol  (ZEBETA ) 5 MG tablet, Take 1/2 tablet (2.5 mg total) by mouth in the morning and at bedtime. (Patient not taking: Reported on 02/20/2024)   bisoprolol  (ZEBETA ) 5 MG tablet, Take 1/2-1 tablet (2.5-5 mg total) by mouth daily for elevated blood pressure   candesartan  (ATACAND ) 8 MG tablet, Take 1 tablet (8 mg total) by mouth daily. (Patient not taking: Reported on 02/20/2024)   candesartan  (ATACAND ) 8 MG tablet, Take 1 tablet (8 mg total) by mouth daily as needed for blood pressure. (Patient not taking: Reported on 02/20/2024)   candesartan  (ATACAND ) 8 MG tablet, Take 1 tablet (8 mg total) by mouth daily as needed for blood pressure - rarely uses   hydrochlorothiazide  (MICROZIDE ) 12.5 MG capsule,  Take 1 capsule (12.5 mg total) by mouth daily for blood pressure   pravastatin  (PRAVACHOL ) 20 MG tablet, TAKE 1 TABLET BY MOUTH DAILY (Patient taking differently: Take 20 mg by mouth at bedtime.)   pravastatin  (PRAVACHOL ) 20 MG tablet, Take 1 tablet (20 mg total) by mouth daily. (Patient not taking: Reported on 02/20/2024)   pravastatin  (PRAVACHOL ) 20 MG tablet, Take 1 tablet (20 mg total) by mouth daily.  Current Outpatient Medications (Respiratory):    albuterol  (PROVENTIL ) (2.5 MG/3ML) 0.083% nebulizer solution, USE 1 VIAL VIA NEBULIZER EVERY 4 TO 6 HOURS AS NEEDED FOR SHORTNESS OF BREATH KATHEEN  Current Outpatient Medications (Analgesics):    naratriptan  (AMERGE) 2.5 MG tablet, Take 1 tablet (2.5 mg total) by mouth as directed for Migraine. May take a second dose  after 4 hours if needed. (Patient not taking: Reported on 02/20/2024)   Current Outpatient Medications (Other):    AMBULATORY NON FORMULARY MEDICATION, 1 Units by Other route once for 1 dose.   amphetamine -dextroamphetamine  (ADDERALL ) 10 MG tablet, Take 1 tablet (10 mg total) by mouth 2 (two) times daily.   buPROPion  (WELLBUTRIN ) 100 MG tablet, Take 1 tablet (100 mg total) by mouth daily with breakfast.   Cholecalciferol (VITAMIN D ) 50 MCG (2000 UT) CAPS, Take 5,000 Units by mouth every 30 (thirty) days.   FLUoxetine  (PROZAC ) 20 MG capsule, Take 1 capsule (20 mg total) by mouth daily.   gabapentin  (NEURONTIN ) 300 MG capsule, Take 1 capsule (300 mg total) by mouth 3 (three) times daily.   hydrOXYzine  (VISTARIL ) 25 MG capsule, Take 1 capsule (25 mg total) by mouth 1 - 2 (two) times daily as needed.   triamcinolone  cream (KENALOG ) 0.5 %, Apply 1 Application topically 2 (two) times daily. To affected areas. (Patient not taking: Reported on 02/20/2024)   zolpidem  (AMBIEN  CR) 6.25 MG CR tablet, Take 1 tablet by mouth at bedtime (Patient not taking: Reported on 02/20/2024)   zolpidem  (AMBIEN  CR) 6.25 MG CR tablet, Take 1 tablet (6.25 mg total)  by mouth at bedtime.   Reviewed prior external information including notes and imaging from  primary care provider As well as notes that were available from care everywhere and other healthcare systems.  Past medical history, social, surgical and family history all reviewed in electronic medical record.  No pertanent information unless stated regarding to the chief complaint.   Review of Systems:  No headache, visual changes, nausea, vomiting, diarrhea, constipation, dizziness, abdominal pain, skin rash, fevers, chills, night sweats, weight loss, swollen lymph nodes, body aches, joint swelling, chest pain, shortness of breath, mood changes. POSITIVE muscle aches  Objective  Blood pressure 122/84, pulse 66, height 5' 7 (1.702 m), weight 167 lb (75.8 kg), SpO2 97%.   General: No apparent distress alert and oriented x3 mood and affect normal, dressed appropriately.  HEENT: Pupils equal, extraocular movements intact  Respiratory: Patient's speak in full sentences and does not appear short of breath  Antalgic gait noted.  Tightness noted in the paraspinal musculature. Left wrist exam shows a positive Finkelstein test noted.  Swelling noted over the radial aspect.  No pain over the navicular bone.  Procedure: Real-time Ultrasound Guided Injection of left Abductor pollicis longs tendon sheath Device: GE Logiq Q7  Ultrasound guided injection is preferred based studies that show increased duration, increased effect, greater accuracy, decreased procedural pain, increased response rate with ultrasound guided versus blind injection.  Verbal informed consent obtained.  Time-out conducted.  Noted no overlying erythema, induration, or other signs of local infection.  Skin prepped in a sterile fashion.  Local anesthesia: Topical Ethyl chloride.  With sterile technique and under real time ultrasound guidance:  tendon visualized.  23g 5/8 inch needle inserted distal to proximal approach into tendon  sheath. Pictures taken  for needle placement. Patient did have injection of 0.5 cc of of 0.5% Marcaine , and 0.5 cc of Depo-Medrol  40 mg/dL. Completed without difficulty  Pain immediately resolved suggesting accurate placement of the medication.  Advised to call if fevers/chills, erythema, induration, drainage, or persistent bleeding.  Images permanently stored  Impression: Technically successful ultrasound guided injection.   Impression and Recommendations:     The above documentation has been reviewed and is accurate and complete Chrystian Cupples M Reanna Scoggin, DO

## 2024-07-19 ENCOUNTER — Ambulatory Visit: Admitting: Family Medicine

## 2024-07-19 ENCOUNTER — Other Ambulatory Visit: Payer: Self-pay

## 2024-07-19 VITALS — BP 122/84 | HR 66 | Ht 67.0 in | Wt 167.0 lb

## 2024-07-19 DIAGNOSIS — G35D Multiple sclerosis, unspecified: Secondary | ICD-10-CM

## 2024-07-19 DIAGNOSIS — M654 Radial styloid tenosynovitis [de Quervain]: Secondary | ICD-10-CM | POA: Diagnosis not present

## 2024-07-19 MED ORDER — METHYLPREDNISOLONE ACETATE 40 MG/ML IJ SUSP
20.0000 mg | Freq: Once | INTRAMUSCULAR | Status: AC
  Administered 2024-07-19: 20 mg via INTRA_ARTICULAR

## 2024-07-19 MED ORDER — AMBULATORY NON FORMULARY MEDICATION: 1.0000 [IU] | Freq: Once | 0 refills | Status: AC

## 2024-07-19 NOTE — Patient Instructions (Addendum)
 Injection in L wrist today Good to see you! Forearm crutches Brace day and night for 2 weeks Then only at night for another 2 weeks  [facility] [address] [place], [place] [address] [phone]  [facility]-Does not file insurance [address] [place], [place] [address] [phone]  See you again in 8 weeks

## 2024-07-19 NOTE — Assessment & Plan Note (Addendum)
 Patient given injection and tolerated the procedure well, increase activity slowly.  We discussed bracing at night will be more beneficial.  Increase activity slowly.  Follow-up again in 6 to 8 weeks patient is seeing surgeon and would like to avoid that at the moment.

## 2024-07-19 NOTE — Assessment & Plan Note (Signed)
 Patient has had some difficulty walking, been using more of a regular cane with causing more wrist pain, written for forearm crutches.

## 2024-08-07 ENCOUNTER — Ambulatory Visit: Admitting: Family Medicine

## 2024-08-20 ENCOUNTER — Other Ambulatory Visit (HOSPITAL_COMMUNITY): Payer: Self-pay

## 2024-08-20 ENCOUNTER — Other Ambulatory Visit: Payer: Self-pay

## 2024-08-20 ENCOUNTER — Other Ambulatory Visit: Payer: Self-pay | Admitting: Neurology

## 2024-08-20 MED ORDER — BUPROPION HCL 100 MG PO TABS
100.0000 mg | ORAL_TABLET | Freq: Every day | ORAL | 3 refills | Status: AC
  Filled 2024-08-20 – 2024-10-16 (×3): qty 90, 90d supply, fill #0

## 2024-08-20 MED ORDER — AMPHETAMINE-DEXTROAMPHETAMINE 10 MG PO TABS
10.0000 mg | ORAL_TABLET | Freq: Two times a day (BID) | ORAL | 0 refills | Status: AC
  Filled 2024-08-20: qty 60, 30d supply, fill #0

## 2024-08-20 NOTE — Telephone Encounter (Signed)
 Medication: Adderall  Directions: Take 1 tablet (10 mg total) by mouth 2 (two) times daily  Last given: 07/03/2024 Number refills: 0 Last o/v:  Follow up:  Labs:     Please review refill request. Wellbutrin  has been taken care of. Just need for Adderall  refill request.

## 2024-08-23 ENCOUNTER — Other Ambulatory Visit: Payer: Self-pay | Admitting: Medical Genetics

## 2024-08-23 ENCOUNTER — Other Ambulatory Visit: Payer: Self-pay | Admitting: Neurology

## 2024-08-23 ENCOUNTER — Other Ambulatory Visit (HOSPITAL_COMMUNITY): Payer: Self-pay

## 2024-08-23 DIAGNOSIS — M216X1 Other acquired deformities of right foot: Secondary | ICD-10-CM | POA: Diagnosis not present

## 2024-08-23 DIAGNOSIS — M25871 Other specified joint disorders, right ankle and foot: Secondary | ICD-10-CM | POA: Diagnosis not present

## 2024-08-23 DIAGNOSIS — M21371 Foot drop, right foot: Secondary | ICD-10-CM | POA: Diagnosis not present

## 2024-08-24 ENCOUNTER — Other Ambulatory Visit: Payer: Self-pay

## 2024-08-24 ENCOUNTER — Other Ambulatory Visit (HOSPITAL_COMMUNITY): Payer: Self-pay

## 2024-08-24 MED ORDER — GABAPENTIN 300 MG PO CAPS
300.0000 mg | ORAL_CAPSULE | Freq: Three times a day (TID) | ORAL | 3 refills | Status: AC
  Filled 2024-08-24: qty 270, 90d supply, fill #0

## 2024-08-24 MED ORDER — PRAVASTATIN SODIUM 20 MG PO TABS
20.0000 mg | ORAL_TABLET | Freq: Every day | ORAL | 3 refills | Status: AC
  Filled 2024-08-24: qty 90, 90d supply, fill #0

## 2024-08-24 MED ORDER — HYDROCHLOROTHIAZIDE 12.5 MG PO CAPS
12.5000 mg | ORAL_CAPSULE | Freq: Every day | ORAL | 3 refills | Status: AC
  Filled 2024-08-24: qty 90, 90d supply, fill #0

## 2024-09-04 NOTE — Progress Notes (Unsigned)
 [facility]  PATIENT: Cynthia Nichols DOB: May 29, 1961  REFERRING DOCTOR OR PCP: Mauricia Fairly, MD ([facility]); Hoy Glatter, FNP (PCP) SOURCE: Patient, notes from neurology, imaging and lab reports.  _________________________________   HISTORICAL  CHIEF COMPLAINT:  No chief complaint on file.   HISTORY OF PRESENT ILLNESS:  Cynthia Nichols is a 63 y.o. woman with multiple sclerosis.  She had no other exacerbations.     Update 02/20/2024: Returns for follow-up visit.  Previously seen by Dr. Vear 02/20/2024.  She had the first year of Mavenclad initiated late June 2022 and second month 04/12/21 - 04/16/21.    She felt very tired and achy the second week but was fine a few days later.    Lymphocytes were 0.6 in 05/2021 and 0.8 10/07/2020 and 0.9 02/17/2022   She never got the MRI last year we ordered (very claustophobic).   She did the second year of Mavenclad  treatment 05/30/22-06/03/2022 and October.    The lymphocytes  were 0.3 08/11/2022 and 0.7 on  11/29/2022.   She reports reduced strength in her legs, R worse than left.   She sees a podiatrist but the recommended shoes were heavy.   Her right leg is weaker than left.  She uses her right AFO for long walks only .  She uses  a cane outside her home.   She has some spasticity in her right leg.   She has numbness and tingling in her feet.  She has itching  She has urge incontinence / nocturia.   Oxybutynin was poorly tolerated so she stopped   She would prefer not to take another pill (we had discussed Myrbetriq ).   She has 2x nocturia.    She has fatigue helped a bit by Adderall - she is just taking one 10 mg in am and we discussed taking asecond one a few hours later.    She sleeps fairly well on Ambien  CR 6.25 mg nightly (6-8 hours).  .Leg cramps are better with potassium.    She has anxiety > depression.   She notes has cognitive issue.  Word finding is difficult at times and mild difficulties with STM.   She has  trouble with parallel processing.     Potassium has been low so she takes a supplement.   She is on HCTZ.    She has LBP, worse with prolonged standing.    MRI lumbar in 2018 was normal by report.   She broke her right humerus in a fall in 2018.   Also has had right ankle fracture.    She broke her ankle 2024, she feels due to reduced balance on a slick surface.   Her foot slid.  She could not catch herself.      She has migraine headaches are doing well and she stopped zonisamide  and Emgality ,   She has non-migraine headaches more than migraine  Gabapentin  *** Hydroxyzine  *** Bupropion  ***   MS History:  She had the onset of right leg weakness one day in 2011.  Symptoms did not improve.  However, she began to have bladder issues a few years earlier.    She initially was seen at [facility].  MRI of the cervical spine 11/12/2009 showed only the one spot at [facility].   She had EP studies (normal) and CSF analysis which was reporgtedly c/w MS.   She was placed on Copaxone.  She had episodes of diplopia which were fleeting.   She was switched  to Tecfidera  bit had trouble tolerating it.   After several years, she switched to Tysabri .   She felt great after the first dose but felt poorly after the second dose so she stopped.   About 2 years ago, she started Mayzent but stopped after 3 months due to feeling poorly.   Though no relapses after she stopped, gait has slowly worsened.  Mavenclad was initiated in June 2022.     Second year in 2023   Imaging reports (actual images have been requested but were not available at the time of the visit): MRI of the brain report 06/14/2019 No significant change nonenhancing white matter foci compatible with multiple sclerosis. No new lesion  MRI of the cervical spine 06/14/2019 report T2 signal: Dorsal lateral right at C1 stable. Right ventrolateral. C4 Which may or may not have been present previously. Bilateral ventral cord left greater than right as seen previously  adjacent to extrusion as per below, question right progression versus artifact... Right lateral C6 stable.. Large longitudinal focus left  T2 stable on sagittal images.    Degenerative change with a large disc osteophyte complex at C5-C6 to the right with moderate bilateral foraminal narrowing and moderate spinal stenosis.  milder problems at other levels.  MRI of the brain 04/06/2023 ([facility]) showed multiple foci in the white matter consistent with chronic demyelination.  There were no new lesions compared to the MRI from 06/19/2019.   MRI of the cervical spine 04/06/2023 ([facility]) showed Redemonstrated multifocal white matter lesions within the cervical spine without enhancement to suggest active demyelination. No new lesions.     Redemonstrated moderate spinal canal stenosis at C5-C6 secondary to retrolisthesis and disc bulge/osteophyte complex. There is unchanged abnormal T2 signal within the anterior cord at this level that is favored to represent a chronic demyelinating plaque.    REVIEW OF SYSTEMS: Constitutional: No fevers, chills, sweats, or change in appetite Eyes: No visual changes, double vision, eye pain Ear, nose and throat: No hearing loss, ear pain, nasal congestion, sore throat Cardiovascular: No chest pain, palpitations Respiratory:  No shortness of breath at rest or with exertion.   No wheezes GastrointestinaI: No nausea, vomiting, diarrhea, abdominal pain, fecal incontinence Genitourinary:  No dysuria, urinary retention or frequency.  No nocturia. Musculoskeletal:  No neck pain, back pain Integumentary: No rash, pruritus, skin lesions Neurological: as above Psychiatric: No depression at this time.  No anxiety Endocrine: No palpitations, diaphoresis, change in appetite, change in weigh or increased thirst Hematologic/Lymphatic:  No anemia, purpura, petechiae. Allergic/Immunologic: No itchy/runny eyes, nasal congestion, recent allergic reactions, rashes  ALLERGIES: Allergies   Allergen Reactions   Ampyra [Dalfampridine] Anaphylaxis and Shortness Of Breath    Chest pain, also    Imitrex [Sumatriptan] Other (See Comments)    Chest tightness   Crestor  [Rosuvastatin  Calcium ] Other (See Comments)    Abdominal pain, shortness of breath   Peanut-Containing Drug Products Itching    Itching in the mouth (remarked that she still eats this in limited amounts)   Shrimp [Shellfish Allergy] Itching    Itching in the mouth (remarked that she still eats this in limited amounts)    HOME MEDICATIONS:  Current Outpatient Medications:    albuterol  (PROVENTIL ) (2.5 MG/3ML) 0.083% nebulizer solution, USE 1 VIAL VIA NEBULIZER EVERY 4 TO 6 HOURS AS NEEDED FOR SHORTNESS OF BREATH /WHEEZING, Disp: 225 mL, Rfl: 0   amphetamine -dextroamphetamine  (ADDERALL) 10 MG tablet, Take 1 tablet (10 mg total) by mouth 2 (two) times daily., Disp: 60 tablet,  Rfl: 0   bisoprolol  (ZEBETA ) 5 MG tablet, Take 1/2 tablet (2.5 mg total) by mouth in the morning and at bedtime. (Patient not taking: Reported on 02/20/2024), Disp: 90 tablet, Rfl: 2   bisoprolol  (ZEBETA ) 5 MG tablet, Take 1/2-1 tablet (2.5-5 mg total) by mouth daily for elevated blood pressure, Disp: 90 tablet, Rfl: 3   buPROPion  (WELLBUTRIN ) 100 MG tablet, Take 1 tablet (100 mg total) by mouth daily with breakfast., Disp: 90 tablet, Rfl: 3   candesartan  (ATACAND ) 8 MG tablet, Take 1 tablet (8 mg total) by mouth daily. (Patient not taking: Reported on 02/20/2024), Disp: 90 tablet, Rfl: 1   candesartan  (ATACAND ) 8 MG tablet, Take 1 tablet (8 mg total) by mouth daily as needed for blood pressure. (Patient not taking: Reported on 02/20/2024), Disp: 90 tablet, Rfl: 1   candesartan  (ATACAND ) 8 MG tablet, Take 1 tablet (8 mg total) by mouth daily as needed for blood pressure - rarely uses, Disp: 90 tablet, Rfl: 1   Cholecalciferol (VITAMIN D ) 50 MCG (2000 UT) CAPS, Take 5,000 Units by mouth every 30 (thirty) days., Disp: , Rfl:    FLUoxetine  (PROZAC ) 20 MG  capsule, Take 1 capsule (20 mg total) by mouth daily., Disp: 90 capsule, Rfl: 3   gabapentin  (NEURONTIN ) 300 MG capsule, Take 1 capsule (300 mg total) by mouth 3 (three) times daily., Disp: 270 capsule, Rfl: 3   hydrochlorothiazide  (MICROZIDE ) 12.5 MG capsule, Take 1 capsule (12.5 mg total) by mouth daily  for blood pressure., Disp: 90 capsule, Rfl: 3   hydrOXYzine  (VISTARIL ) 25 MG capsule, Take 1 capsule (25 mg total) by mouth 1 - 2 (two) times daily as needed., Disp: 60 capsule, Rfl: 5   naratriptan  (AMERGE) 2.5 MG tablet, Take 1 tablet (2.5 mg total) by mouth as directed for Migraine. May take a second dose after 4 hours if needed. (Patient not taking: Reported on 02/20/2024), Disp: 10 tablet, Rfl: 11   pravastatin  (PRAVACHOL ) 20 MG tablet, TAKE 1 TABLET BY MOUTH DAILY (Patient taking differently: Take 20 mg by mouth at bedtime.), Disp: 30 tablet, Rfl: 6   pravastatin  (PRAVACHOL ) 20 MG tablet, Take 1 tablet (20 mg total) by mouth daily. (Patient not taking: Reported on 02/20/2024), Disp: 90 tablet, Rfl: 1   pravastatin  (PRAVACHOL ) 20 MG tablet, Take 1 tablet (20 mg total) by mouth daily., Disp: 90 tablet, Rfl: 3   pravastatin  (PRAVACHOL ) 20 MG tablet, Take 1 tablet (20 mg total) by mouth daily., Disp: 90 tablet, Rfl: 3   triamcinolone  cream (KENALOG ) 0.5 %, Apply 1 Application topically 2 (two) times daily. To affected areas. 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   Aortic atherosclerosis 11/29/2017   Asthma    Asthma 11/02/2019   Closed fracture of shaft of right  humerus 05/13/2017   Closed low lateral malleolus fracture, left, initial encounter 10/17/2018   COVID 07/2021   mild case   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   Essential hypertension, benign 11/29/2017   Fracture of fifth  metatarsal bone 08/13/2015   Greater trochanteric bursitis, left 12/31/2019   Injection given December 31, 2019   Headache(784.0)    Humerus fracture    right with radial nerve palsy   Hypertension    IBS (irritable bowel syndrome)    Migraine without aura and without status migrainosus, not intractable 02/09/2018   MS (multiple sclerosis)    Multiple sclerosis, relapsing-remitting 12/14/2011   Osteoporosis 11/29/2017   Other fracture of shaft of right humerus, initial encounter for closed fracture 05/13/2017   Patella fracture 07/03/2015   Hairline oblique     Patellar subluxation, left, initial encounter 08/14/2018   PONV (postoperative nausea and vomiting)    only on this surgery   Radial nerve palsy, 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   Right-sided    Unspecified venous (peripheral) insufficiency 11/09/2011   Venous aneurysm    Right side of neck    PAST SURGICAL HISTORY: Past Surgical History:  Procedure Laterality Date   ABDOMINAL HYSTERECTOMY     BREAST MASS EXCISION     Bilateral breast mass excision ( Benign)   ORIF HUMERUS FRACTURE Right 05/17/2017   RADIOLOGY WITH ANESTHESIA N/A 11/03/2021   Procedure: MRI WITH BRAIN WITH AND WITHOUT,CERVICAL WITH AND WITHOUT CONTRAST;  Surgeon: Radiologist, Medication, MD;  Location: [facility] OR;  Service: Radiology;  Laterality: N/A;   TENNIS ELBOW RELEASE/NIRSCHEL PROCEDURE Right 05/17/2017   Procedure: EXPLORATION AND EXPOSURE OF RADIAL NERVE RIGHT ARM;  Surgeon: Camella Fallow, MD;  Location: [facility] OR;  Service: Orthopedics;  Laterality: Right;    FAMILY HISTORY: Family History  Adopted: Yes    SOCIAL HISTORY:  Social History   Socioeconomic History   Marital status:  Married    Spouse name: Meagan Ancona   Number of children: 3   Years of education: Not on file   Highest education level: Associate degree: occupational, scientist, product/process development, or vocational program  Occupational History   Occupation: disabled    Comment: [employer]  Tobacco Use   Smoking status: Never   Smokeless tobacco: Never  Vaping Use   Vaping status: Never Used  Substance and Sexual Activity   Alcohol use: No    Alcohol/week: 0.0 standard drinks of alcohol   Drug use: No   Sexual activity: Yes    Partners: Male  Other Topics Concern   Not on file  Social History Narrative   Not on file   Social Drivers of Health   Tobacco Use: Low Risk (04/10/2024)   Received from [facility]   Patient History    Smoking Tobacco Use: Never    Smokeless Tobacco Use: Never    Passive Exposure: Not on file  Financial Resource Strain: Not on file  Food Insecurity: Not on file  Transportation Needs: Not on file  Physical Activity: Not on file  Stress: Not on file  Social Connections: Not on file  Intimate Partner Violence: Not on file  Depression (EYV7-0): Not on file  Alcohol Screen: Not on file  Housing: Not on file  Utilities: Not on file  Health Literacy: Not on file     PHYSICAL EXAM  There were no vitals filed for this visit.   There is no height or weight on file to calculate BMI.   General: The patient is well-developed and well-nourished and in no acute distress  HEENT:  Head is Derma/AT.  Sclera are anicteric.    Neck: the neck is nontender.  Skin: Extremities are without rash or  edema.  Musculoskeletal:  Back is nontender  Neurologic Exam  Mental status: The patient is alert and oriented x 3 at the time of the examination. The patient has apparent normal recent and remote memory, with an apparently normal attention span and concentration ability.   Speech is normal.  Cranial nerves: Extraocular movements are full.  Facial strength appeared normal.  No dysarthria.  No obvious  hearing deficits are noted.  Motor:  Muscle bulk is normal.   Tone is increased in the right leg.   Strength is 3/5 right hip flexors, 4+/5 quads, 4/5 hamstrings and 2+/5 toe/ankle extension.   Left leg is 4+/5 hip flexors, 4/5 ankle/toe extensors and 5/5 elsewhere.  Strength is  5 / 5 arms.   Sensory: Sensory testing is intact to pinprick, soft touch and vibration sensation in all 4 extremities.  Coordination: Cerebellar testing reveals good finger-nose-finger and reduced heel to shin worse on right  Gait and station: Station is normal.   She has a wide based gait with a right > left foot drop.  Unable to do a tandem walk the Romberg sign was negative  Reflexes: Deep tendon reflexes are symmetric and normal in arms, slightly increased right leg.       DIAGNOSTIC DATA (LABS, IMAGING, TESTING) - I reviewed patient records, labs, notes, testing and imaging myself where available.  Lab Results  Component Value Date   WBC 4.1 11/29/2022   HGB 13.5 11/29/2022   HCT 39.5 11/29/2022   MCV 92 11/29/2022   PLT 194 11/29/2022      Component Value Date/Time   NA 144 11/29/2022 1401   K 3.6 11/29/2022 1401   CL 102 11/29/2022 1401   CO2 26 11/29/2022 1401   GLUCOSE 90 11/29/2022 1401   GLUCOSE 120 (H) 07/11/2020 1708   BUN 13 11/29/2022 1401   CREATININE 0.78 11/29/2022 1401   CREATININE 0.59 02/09/2018 0930   CALCIUM  9.7 11/29/2022 1401   PROT 7.0 11/29/2022 1401   ALBUMIN 4.4 11/29/2022 1401   AST 27 11/29/2022 1401   ALT 32 11/29/2022 1401   ALKPHOS 97 11/29/2022 1401   BILITOT 0.8 11/29/2022 1401   GFRNONAA >60 07/11/2020 1708   GFRNONAA 84 12/14/2017 0945   GFRAA 98 12/14/2017 0945    Lab Results  Component Value Date   TSH 1.07 03/09/2017       ASSESSMENT AND PLAN  No diagnosis found.   She has completed Mavenclad .  Labs with PCP were normal in 2024.     We discussed the natural history of MS.  She has had no recent exacerbation or progression.   Has had some  very slow progression likely SPMS changes mixed with aging.  If one of the BTK inhibitors becomes available this could be considered For migraines will take Amerge (if not covered by insurance, change to Relpax 20 mg). 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  rtc 6 months or sooner if new or worsening issues        I personally spent a total of *** minutes in the care of the patient today including {Time Based Coding:210964241}.  Harlene Bogaert, AGNP-BC  [facility] [address] [place], [place] [address]  Phone [phone] Fax [phone] Note: This document was prepared with digital dictation and possible smart phrase technology. Any transcriptional errors that result from this process are unintentional.

## 2024-09-05 ENCOUNTER — Ambulatory Visit: Admitting: Adult Health

## 2024-09-05 ENCOUNTER — Encounter: Payer: Self-pay | Admitting: Adult Health

## 2024-09-05 VITALS — BP 130/73 | HR 68 | Ht 67.0 in | Wt 162.4 lb

## 2024-09-05 DIAGNOSIS — F418 Other specified anxiety disorders: Secondary | ICD-10-CM

## 2024-09-05 DIAGNOSIS — G35A Relapsing-remitting multiple sclerosis: Secondary | ICD-10-CM | POA: Diagnosis not present

## 2024-09-05 DIAGNOSIS — R29898 Other symptoms and signs involving the musculoskeletal system: Secondary | ICD-10-CM | POA: Diagnosis not present

## 2024-09-05 DIAGNOSIS — R202 Paresthesia of skin: Secondary | ICD-10-CM | POA: Diagnosis not present

## 2024-09-05 DIAGNOSIS — R269 Unspecified abnormalities of gait and mobility: Secondary | ICD-10-CM

## 2024-09-05 DIAGNOSIS — F988 Other specified behavioral and emotional disorders with onset usually occurring in childhood and adolescence: Secondary | ICD-10-CM

## 2024-09-05 NOTE — Patient Instructions (Addendum)
 Your Plan:  Referral will be placed to PT for concern of worsening left leg/foot weakness and discuss potential benefit with AFO  Continue all current medications at this time  Please call with any questions or concerns prior to follow up visit    Follow-up with Dr. Vear in 6 months or call earlier if needed     Thank you for coming to see us  at [facility]. I hope we have been able to provide you high quality care today.  You may receive a patient satisfaction survey over the next few weeks. We would appreciate your feedback and comments so that we may continue to improve ourselves and the health of our patients.

## 2024-09-11 NOTE — Progress Notes (Deleted)
 " [facility] [address] [place] [address] Phone: [phone] Subjective:    I'm seeing this patient by the request  of:  Ina Marcellus RAMAN, MD  CC:   YEP:Dlagzrupcz  07/19/2024 Patient given injection and tolerated the procedure well, increase activity slowly.  We discussed bracing at night will be more beneficial.  Increase activity slowly.  Follow-up again in 6 to 8 weeks patient is seeing surgeon and would like to avoid that at the moment.    Patient has had some difficulty walking, been using more of a regular cane with causing more wrist pain, written for forearm crutches.   Updated 09/17/2024 Cynthia Nichols is a 63 y.o. 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   Essential hypertension, benign 11/29/2017   Fracture of fifth metatarsal bone 08/13/2015   Greater trochanteric bursitis, left 12/31/2019   Injection given December 31, 2019   Headache(784.0)    Humerus fracture    right with radial nerve palsy   Hypertension    IBS (irritable bowel syndrome)    Migraine without aura and without status migrainosus, not intractable 02/09/2018   MS (multiple sclerosis)    Multiple sclerosis, relapsing-remitting 12/14/2011   Osteoporosis 11/29/2017   Other fracture of shaft of right humerus, initial encounter for closed fracture 05/13/2017   Patella fracture 07/03/2015   Hairline oblique     Patellar subluxation, left, initial encounter 08/14/2018   PONV (postoperative nausea and vomiting)    only on this surgery   Radial nerve palsy, 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   Right-sided    Unspecified venous (peripheral) insufficiency 11/09/2011   Venous aneurysm    Right side of neck   Past Surgical History:  Procedure Laterality Date   ABDOMINAL HYSTERECTOMY     BREAST MASS EXCISION     Bilateral breast mass excision ( Benign)   ORIF HUMERUS FRACTURE Right 05/17/2017   RADIOLOGY WITH ANESTHESIA N/A 11/03/2021   Procedure: MRI WITH BRAIN WITH AND WITHOUT,CERVICAL WITH AND WITHOUT CONTRAST;  Surgeon: Radiologist, Medication, MD;  Location: [facility] OR;  Service: Radiology;  Laterality: N/A;   TENNIS ELBOW RELEASE/NIRSCHEL PROCEDURE Right 05/17/2017   Procedure: EXPLORATION AND EXPOSURE OF RADIAL NERVE RIGHT ARM;  Surgeon: Camella Fallow, MD;  Location: [facility] OR;  Service: Orthopedics;  Laterality: Right;   Social History   Socioeconomic History   Marital status: Married    Spouse name: Lynora Dymond   Number of children: 3   Years of education: Not on file   Highest education level: Associate degree: occupational, scientist, product/process development, or vocational program  Occupational History   Occupation: disabled    Comment: [employer]  Tobacco Use   Smoking status: Never   Smokeless tobacco: Never  Vaping Use   Vaping status: Never Used  Substance and Sexual Activity   Alcohol use: No    Alcohol/week: 0.0 standard drinks of alcohol   Drug use: No   Sexual activity: Yes    Partners: Male  Other  Topics Concern   Not on file  Social History Narrative   1 cup of coffee daily    Social Drivers of Health   Tobacco Use: Low Risk (09/05/2024)   Patient History    Smoking Tobacco Use: Never    Smokeless Tobacco Use: Never    Passive Exposure: Not on file  Financial Resource Strain: Not on file  Food Insecurity: Not on file  Transportation Needs: Not on file  Physical Activity: Not on file  Stress: Not on file  Social Connections: Not on file  Depression (EYV7-0): Not on file  Alcohol Screen: Not on file  Housing: Not on  file  Utilities: Not on file  Health Literacy: Not on file   Allergies[1] Family History  Adopted: Yes    Current Outpatient Medications (Cardiovascular):    bisoprolol  (ZEBETA ) 5 MG tablet, Take 1/2 tablet (2.5 mg total) by mouth in the morning and at bedtime.   bisoprolol  (ZEBETA ) 5 MG tablet, Take 1/2-1 tablet (2.5-5 mg total) by mouth daily for elevated blood pressure   candesartan  (ATACAND ) 8 MG tablet, Take 1 tablet (8 mg total) by mouth daily.   candesartan  (ATACAND ) 8 MG tablet, Take 1 tablet (8 mg total) by mouth daily as needed for blood pressure.   candesartan  (ATACAND ) 8 MG tablet, Take 1 tablet (8 mg total) by mouth daily as needed for blood pressure - rarely uses   hydrochlorothiazide  (MICROZIDE ) 12.5 MG capsule, Take 1 capsule (12.5 mg total) by mouth daily  for blood pressure.   pravastatin  (PRAVACHOL ) 20 MG tablet, TAKE 1 TABLET BY MOUTH DAILY (Patient taking differently: Take 20 mg by mouth at bedtime.)   pravastatin  (PRAVACHOL ) 20 MG tablet, Take 1 tablet (20 mg total) by mouth daily.   pravastatin  (PRAVACHOL ) 20 MG tablet, Take 1 tablet (20 mg total) by mouth daily.   pravastatin  (PRAVACHOL ) 20 MG tablet, Take 1 tablet (20 mg total) by mouth daily.  Current Outpatient Medications (Respiratory):    albuterol  (PROVENTIL ) (2.5 MG/3ML) 0.083% nebulizer solution, USE 1 VIAL VIA NEBULIZER EVERY 4 TO 6 HOURS AS NEEDED FOR SHORTNESS OF BREATH KATHEEN  Current Outpatient Medications (Other):    amphetamine -dextroamphetamine  (ADDERALL) 10 MG tablet, Take 1 tablet (10 mg total) by mouth 2 (two) times daily.   buPROPion  (WELLBUTRIN ) 100 MG tablet, Take 1 tablet (100 mg total) by mouth daily with breakfast.   Cholecalciferol (VITAMIN D ) 50 MCG (2000 UT) CAPS, Take 5,000 Units by mouth every 30 (thirty) days.   FLUoxetine  (PROZAC ) 20 MG capsule, Take 1 capsule (20 mg total) by mouth daily.   gabapentin  (NEURONTIN ) 300 MG capsule, Take 1 capsule (300 mg total) by mouth 3 (three)  times daily.   hydrOXYzine  (VISTARIL ) 25 MG capsule, Take 1 capsule (25 mg total) by mouth 1 - 2 (two) times daily as needed.   triamcinolone  cream (KENALOG ) 0.5 %, Apply 1 Application topically 2 (two) times daily. To affected areas.   zolpidem  (AMBIEN  CR) 6.25 MG CR tablet, Take 1 tablet by mouth at bedtime   zolpidem  (AMBIEN  CR) 6.25 MG CR tablet, Take 1 tablet (6.25 mg total) by mouth at bedtime.   Reviewed prior external information including notes and imaging from  primary care provider As well as notes that were available from care everywhere and other healthcare systems.  Past medical history, social, surgical and family history all reviewed in electronic medical record.  No pertanent information unless stated regarding to the chief complaint.   Review of Systems:  No  headache, visual changes, nausea, vomiting, diarrhea, constipation, dizziness, abdominal pain, skin rash, fevers, chills, night sweats, weight loss, swollen lymph nodes, body aches, joint swelling, chest pain, shortness of breath, mood changes. POSITIVE muscle aches  Objective  There were no vitals taken for this visit.   General: No apparent distress alert and oriented x3 mood and affect normal, dressed appropriately.  HEENT: Pupils equal, extraocular movements intact  Respiratory: Patient's speak in full sentences and does not appear short of breath  Cardiovascular: No lower extremity edema, non tender, no erythema      Impression and Recommendations:           [1]  Allergies Allergen Reactions   Ampyra [Dalfampridine] Anaphylaxis and Shortness Of Breath    Chest pain, also    Imitrex [Sumatriptan] Other (See Comments)    Chest tightness   Crestor  [Rosuvastatin  Calcium ] Other (See Comments)    Abdominal pain, shortness of breath   Peanut-Containing Drug Products Itching    Itching in the mouth (remarked that she still eats this in limited amounts)   Shrimp [Shellfish Allergy] Itching    Itching in  the mouth (remarked that she still eats this in limited amounts)   "

## 2024-09-17 ENCOUNTER — Ambulatory Visit: Admitting: Family Medicine

## 2024-10-03 ENCOUNTER — Ambulatory Visit: Attending: Adult Health | Admitting: Physical Therapy

## 2024-10-03 ENCOUNTER — Other Ambulatory Visit (HOSPITAL_COMMUNITY): Payer: Self-pay

## 2024-10-03 DIAGNOSIS — R278 Other lack of coordination: Secondary | ICD-10-CM | POA: Diagnosis present

## 2024-10-03 DIAGNOSIS — R2689 Other abnormalities of gait and mobility: Secondary | ICD-10-CM | POA: Diagnosis present

## 2024-10-03 DIAGNOSIS — R2681 Unsteadiness on feet: Secondary | ICD-10-CM | POA: Diagnosis present

## 2024-10-03 DIAGNOSIS — R269 Unspecified abnormalities of gait and mobility: Secondary | ICD-10-CM | POA: Diagnosis not present

## 2024-10-03 DIAGNOSIS — M6281 Muscle weakness (generalized): Secondary | ICD-10-CM | POA: Insufficient documentation

## 2024-10-03 DIAGNOSIS — G35A Relapsing-remitting multiple sclerosis: Secondary | ICD-10-CM | POA: Insufficient documentation

## 2024-10-03 DIAGNOSIS — R29898 Other symptoms and signs involving the musculoskeletal system: Secondary | ICD-10-CM | POA: Insufficient documentation

## 2024-10-03 MED ORDER — ZOLPIDEM TARTRATE ER 6.25 MG PO TBCR
6.2500 mg | EXTENDED_RELEASE_TABLET | Freq: Every day | ORAL | 0 refills | Status: AC
  Filled 2024-10-03: qty 90, 90d supply, fill #0

## 2024-10-03 NOTE — Therapy (Signed)
 " OUTPATIENT PHYSICAL THERAPY NEURO EVALUATION   Patient Name: Cynthia Nichols MRN: [id number] DOB:[birth year], 64 y.o., female Today's Date: 10/03/2024   PCP: Ina Marcellus RAMAN, MD REFERRING PROVIDER: Whitfield Raisin, NP  END OF SESSION:  PT End of Session - 10/03/24 0805     Visit Number 1    Number of Visits 13   with eval   Date for Recertification  11/28/24   to allow for scheduling delays   Authorization Type HTA    PT Start Time 0805   pt arrived late   PT Stop Time 0845    PT Time Calculation (min) 40 min    Equipment Utilized During Treatment Gait belt    Activity Tolerance Patient tolerated treatment well    Behavior During Therapy [facility] for tasks assessed/performed          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   Essential hypertension, benign 11/29/2017   Fracture of fifth metatarsal bone 08/13/2015   Greater trochanteric bursitis, left 12/31/2019   Injection given December 31, 2019   Headache(784.0)    Humerus fracture    right with radial nerve palsy   Hypertension    IBS (irritable bowel syndrome)    Migraine without aura and without status migrainosus, not intractable 02/09/2018   MS (multiple sclerosis)    Multiple sclerosis, relapsing-remitting 12/14/2011   Osteoporosis 11/29/2017   Other fracture of shaft of right humerus, initial encounter for closed fracture 05/13/2017   Patella fracture 07/03/2015   Hairline oblique     Patellar subluxation, left, initial encounter 08/14/2018   PONV (postoperative nausea and vomiting)    only on this surgery   Radial nerve palsy, 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   SI  (sacroiliac) joint dysfunction 10/23/2015   Right-sided    Unspecified venous (peripheral) insufficiency 11/09/2011   Venous aneurysm    Right side of neck   Past Surgical History:  Procedure Laterality Date   ABDOMINAL HYSTERECTOMY     BREAST MASS EXCISION     Bilateral breast mass excision ( Benign)   ORIF HUMERUS FRACTURE Right 05/17/2017   RADIOLOGY WITH ANESTHESIA N/A 11/03/2021   Procedure: MRI WITH BRAIN WITH AND WITHOUT,CERVICAL WITH AND WITHOUT CONTRAST;  Surgeon: Radiologist, Medication, MD;  Location: [facility] OR;  Service: Radiology;  Laterality: N/A;   TENNIS ELBOW RELEASE/NIRSCHEL PROCEDURE Right 05/17/2017   Procedure: EXPLORATION AND EXPOSURE OF RADIAL NERVE RIGHT ARM;  Surgeon: Camella Fallow, MD;  Location: [facility] OR;  Service: Orthopedics;  Laterality: Right;   Patient Active Problem List   Diagnosis Date Noted   Sesamoiditis of right foot 02/07/2024   Capsulitis of metatarsophalangeal (MTP) joint of right foot 10/05/2023   De Quervain's tenosynovitis, left 08/23/2023   Moderate left ankle sprain 07/21/2023   Knee contusion 04/14/2022   Right foot drop 12/02/2021   Degenerative arthritis of right knee 11/27/2021   High risk medication use 02/17/2021   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   Urge incontinence 02/17/2021   Bilateral dry eyes 09/01/2020   Nuclear sclerosis of both eyes 09/01/2020   PONV (postoperative nausea and vomiting)  MS (multiple sclerosis)    Hypertension    Humerus fracture    Adopted    Degenerative arthritis of left knee 12/31/2019   Greater trochanteric bursitis, left 12/31/2019   Asthma 11/02/2019   Cough 11/02/2019   Carpal tunnel syndrome of left wrist 04/30/2019   Cubital tunnel syndrome 04/30/2019   Chronic fatigue 11/28/2018   Closed low lateral malleolus fracture, left, initial encounter 10/17/2018   Patellar subluxation, left, initial encounter 08/14/2018   Primary insomnia 05/16/2018   Migraine  without aura and without status migrainosus, not intractable 02/09/2018   Gait disturbance 01/11/2018   Intractable migraine with aura without status migrainosus 01/11/2018   Aortic atherosclerosis 11/29/2017   Medication monitoring encounter 11/29/2017   Osteoporosis 11/29/2017   Essential hypertension, benign 11/29/2017   Pulmonary nodules 10/06/2017   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   Radial nerve palsy, 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   Humerus shaft fracture 05/17/2017   Closed fracture of shaft of right humerus 05/13/2017   Other fracture of shaft of right humerus, initial encounter for closed fracture 05/13/2017   Contusion of elbow and forearm, initial encounter 08/26/2016   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   Nonallopathic lesion of sacral region 10/23/2015   Nonallopathic lesion of pelvic region 10/23/2015   Nonallopathic lesion of lumbosacral region 10/23/2015   Biomechanical lesion, unspecified 10/23/2015   Fracture of fifth metatarsal bone 08/13/2015   Patella fracture 07/03/2015   Paresthesia 06/19/2015   Bilateral leg pain 12/18/2014   Palpitations 06/23/2012   Mixed hyperlipidemia 06/23/2012   Venous aneurysm    IBS (irritable bowel syndrome)    Multiple sclerosis, relapsing-remitting (HCC) 12/14/2011   Environmental allergies 12/14/2011   Episodic tension-type headache 12/14/2011   Venous (peripheral) insufficiency 11/09/2011   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   Leg pain 10/26/2011    ONSET DATE: 09/05/2024 (referral date)  REFERRING DIAG: G35.A (ICD-10-CM) - Multiple sclerosis, relapsing-remitting R26.9 (ICD-10-CM) - Gait disturbance R29.898 (ICD-10-CM) - Weakness of both lower extremities  THERAPY DIAG:  Muscle weakness (generalized)  Other abnormalities of gait and mobility  Other lack of coordination  Unsteadiness on feet  Rationale for Evaluation and Treatment: Rehabilitation  SUBJECTIVE:                                                                                                                                                                                              SUBJECTIVE STATEMENT:  Cynthia Nichols  Pt with a history of MS, follows with Dr. Vear at [facility]. Her next appointment with him is in August. She reports she does not do infusions for her MS. Historically her weak  side has been her R side but she has noticed her LLE gradually getting weaker. She reports that she does have a brace for her RLE. She tries to walk 1.5 miles per day, this is the only time she wears her brace. She can tell that she is wearing her tennis shoes more on her L side now. She is interested in seeing if she needs a brace for her L side.  Pt reports that at home she wears socks and tends to just scoot her feet along the floor. She also furniture surfs.  Pt accompanied by: self, drove herself  PERTINENT HISTORY: PMH: asthma, HTN, IBS, migraines, MS, osteoporosis  PAIN:  Are you having pain? Yes: NPRS scale: 0/10 at rest currently Pain location: L knee from fall, L hip pain comes and goes Pain description: toothache Aggravating factors: doing a lot, lots of movement Relieving factors: ibuprofen, massage  PRECAUTIONS: Fall  RED FLAGS: None   WEIGHT BEARING RESTRICTIONS: No  FALLS: Has patient fallen in last 6 months? Yes. Number of falls one recently - she tripped over her feet and injured her L knee, her husband had to help her back up  LIVING ENVIRONMENT: Lives with: lives with their spouse Lives in: House/apartment Stairs: Yes: External: 4-5 steps; on right going up one level once inside Has following equipment at home: Environmental Consultant - 2 wheeled, Environmental Consultant - 4 wheeled, and [employer] oil  PLOF: Independent with basic ADLs, Independent with household mobility with device, Independent with gait, Independent with transfers, and Requires assistive device for independence  PATIENT GOALS: I want to keep up what strength I got try not to wear out  a pair of shoes  OBJECTIVE:  Note: Objective measures were completed at Evaluation unless otherwise noted.  DIAGNOSTIC FINDINGS: Nothing in chart related to this POC  COGNITION: Overall cognitive status: Within functional limits for tasks assessed   SENSATION: Gets burning sensation in feet Decreased light touch in distal RLE  COORDINATION: Impaired in BLE  EDEMA:  Swelling comes and goes  POSTURE: rounded shoulders, forward head, and increased thoracic kyphosis  LOWER EXTREMITY ROM:   WFL  Active  Right Eval Left Eval  Hip flexion    Hip extension    Hip abduction    Hip adduction    Hip internal rotation    Hip external rotation    Knee flexion    Knee extension    Ankle dorsiflexion    Ankle plantarflexion    Ankle inversion    Ankle eversion     (Blank rows = not tested)  LOWER EXTREMITY MMT:    MMT Right Eval Left Eval  Hip flexion 3 4+  Hip extension    Hip abduction    Hip adduction    Hip internal rotation    Hip external rotation    Knee flexion 3- 5  Knee extension 3 5  Ankle dorsiflexion 3 4  Ankle plantarflexion    Ankle inversion    Ankle eversion    (Blank rows = not tested)  BED MOBILITY:  Little bit of trouble; uses exercise step to get in/out; bed is elevated  TRANSFERS: Sit to stand: Modified independence  Assistive device utilized: None     Stand to sit: Modified independence  Assistive device utilized: None     Chair to chair: Modified independence  Assistive device utilized: None       RAMP:  Not tested  CURB:  Not tested  STAIRS:  STAIRS:  Level of Assistance:  SBA  Stair Negotiation Technique: Alternating Pattern  with Bilateral Rails  Number of Stairs: 4   Height of Stairs: 6  Comments: decreased hip and knee flexion, decreased B ankle DF   GAIT: Findings:  Gait pattern: decreased hip/knee flexion- Right, decreased hip/knee flexion- Left, decreased ankle dorsiflexion- Right, decreased ankle dorsiflexion-  Left, scissoring, and narrow BOS Distance walked: various clinic distances Assistive device utilized: hurrycane Level of assistance: Modified independence Comments: R hip appears weaker than L hip, decreased R ankle DF as compared to L side, mild path deviation   FUNCTIONAL TESTS:    [facility] PT Assessment - 10/03/24 0821       Ambulation/Gait   Gait velocity 32.8 ft over 18.6 sec = 1.76 ft/sec      Standardized Balance Assessment   Standardized Balance Assessment Timed Up and Go Test;Five Times Sit to Stand    Five times sit to stand comments  15.63 sec   no UE     Timed Up and Go Test   TUG Normal TUG    Normal TUG (seconds) 14.25   with hurrycane     Functional Gait  Assessment   Gait assessed  Yes    Gait Level Surface Walks 20 ft, slow speed, abnormal gait pattern, evidence for imbalance or deviates 10-15 in outside of the 12 in walkway width. Requires more than 7 sec to ambulate 20 ft.    Change in Gait Speed Makes only minor adjustments to walking speed, or accomplishes a change in speed with significant gait deviations, deviates 10-15 in outside the 12 in walkway width, or changes speed but loses balance but is able to recover and continue walking.    Gait with Horizontal Head Turns Performs head turns with moderate changes in gait velocity, slows down, deviates 10-15 in outside 12 in walkway width but recovers, can continue to walk.    Gait with Vertical Head Turns Performs task with slight change in gait velocity (eg, minor disruption to smooth gait path), deviates 6 - 10 in outside 12 in walkway width or uses assistive device    Gait and Pivot Turn Turns slowly, requires verbal cueing, or requires several small steps to catch balance following turn and stop    Step Over Obstacle Is able to step over one shoe box (4.5 in total height) but must slow down and adjust steps to clear box safely. May require verbal cueing.    Gait with Narrow Base of Support Ambulates 4-7 steps.     Gait with Eyes Closed Walks 20 ft, slow speed, abnormal gait pattern, evidence for imbalance, deviates 10-15 in outside 12 in walkway width. Requires more than 9 sec to ambulate 20 ft.    Ambulating Backwards Walks 20 ft, slow speed, abnormal gait pattern, evidence for imbalance, deviates 10-15 in outside 12 in walkway width.    Steps Alternating feet, must use rail.    Total Score 12    FGA comment: 12/30, high fall risk           PATIENT SURVEYS:  None assessed at eval  TREATMENT DATE: PT Evaluation   PATIENT EDUCATION: Education details: Eval findings, PT POC, results of OM and functional implications Person educated: Patient Education method: Explanation and Demonstration Education comprehension: verbalized understanding, returned demonstration, and needs further education  HOME EXERCISE PROGRAM: To be initiated  GOALS: Goals reviewed with patient? Yes  SHORT TERM GOALS: Target date: 10/26/2024   Pt will be independent with initial HEP for improved strength, balance, transfers and gait. Baseline: Goal status: INITIAL  2.  Pt to trial a L AFO to determine if she would benefit from bracing on this limb with gait. Baseline:  Goal status: INITIAL  3.  Pt will improve gait velocity to at least 2.0 ft/sec for improved gait efficiency and performance at mod I level  Baseline: 1.76 ft/sec with hurrycane mod I (1/14) Goal status: INITIAL  4.  Pt will improve FGA to 16/30 for decreased fall risk  Baseline: 12/30 (1/14) Goal status: INITIAL    LONG TERM GOALS: Target date: 11/16/2024   Pt will be independent with final HEP for improved strength, balance, transfers and gait. Baseline:  Goal status: INITIAL  2.  Pt to obtain L AFO if determined it is needed for improved gait mechanics and decreased fall risk. Baseline:  Goal status: INITIAL  3.   Pt will improve gait velocity to at least 2.25 ft/sec for improved gait efficiency and performance at mod I level  Baseline: 1.76 ft/sec with hurrycane mod I (1/14) Goal status: INITIAL  4.  Pt will improve FGA to 20/30 for decreased fall risk  Baseline: 12/30 (1/14) Goal status: INITIAL  5.  Pt will improve normal TUG to less than or equal to 13 seconds for improved functional mobility and decreased fall risk. Baseline: 14.25 sec with hurrycane mod I (1/14) Goal status: INITIAL   ASSESSMENT:  CLINICAL IMPRESSION: Patient is a 64 year old female referred to Neuro OPPT for LE weakness and gait disturbance related to her RRMS.   Pt's PMH is significant for: asthma, HTN, IBS, migraines, MS, osteoporosis. The following deficits were present during the exam: postural abnormalities, decreased BLE strength (R>L), and balance impairments. Based on her fall history and LE weakness, pt is an increased risk for falls. Pt would benefit from skilled PT to address these impairments and functional limitations to maximize functional mobility independence.   OBJECTIVE IMPAIRMENTS: Abnormal gait, decreased balance, decreased coordination, difficulty walking, decreased strength, impaired perceived functional ability, improper body mechanics, postural dysfunction, and pain.   ACTIVITY LIMITATIONS: carrying, lifting, bending, standing, squatting, stairs, and transfers  PARTICIPATION LIMITATIONS: community activity  PERSONAL FACTORS: Time since onset of injury/illness/exacerbation and 3+ comorbidities:   asthma, HTN, IBS, migraines, MS, osteoporosisare also affecting patient's functional outcome.   REHAB POTENTIAL: Good  CLINICAL DECISION MAKING: Evolving/moderate complexity  EVALUATION COMPLEXITY: Moderate  PLAN:  PT FREQUENCY: 2x/week  PT DURATION: 6 weeks  PLANNED INTERVENTIONS: [id number]- PT Re-evaluation, 97750- Physical Performance Testing, 97110-Therapeutic exercises, 97530- Therapeutic activity,  [id number]- Neuromuscular re-education, 97535- Self Care, [id number]- Manual therapy, [id number]- Gait training, [phone]- Orthotic Initial, [phone]- Orthotic/Prosthetic subsequent, [phone]- Aquatic Therapy, [phone]- Electrical stimulation (manual), [phone] (1-2 muscles), [id number] (3+ muscles)- Dry Needling, Patient/Family education, Balance training, Stair training, Taping, Joint mobilization, DME instructions, Cryotherapy, and Moist heat  PLAN FOR NEXT SESSION: did she bring tennis shoes and R AFO? Trial L AFOs, initiate HEP to work on balance and functional strengthening: sit to stands, squats, glutes, stairs   Waddell Southgate, PT Waddell Southgate, PT, DPT, CSRS  10/03/2024, 8:46 AM        "

## 2024-10-10 ENCOUNTER — Ambulatory Visit: Admitting: Physical Therapy

## 2024-10-10 NOTE — Therapy (Incomplete)
 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   Essential hypertension, benign 11/29/2017   Fracture of fifth metatarsal bone 08/13/2015   Greater trochanteric bursitis, left 12/31/2019   Injection given December 31, 2019   Headache(784.0)    Humerus fracture    right with radial nerve palsy   Hypertension    IBS (irritable bowel syndrome)    Migraine without aura and without status migrainosus, not intractable 02/09/2018   MS (multiple sclerosis)    Multiple sclerosis, relapsing-remitting 12/14/2011   Osteoporosis 11/29/2017   Other fracture of shaft of right humerus, initial encounter for closed fracture 05/13/2017   Patella fracture 07/03/2015   Hairline oblique     Patellar subluxation, left, initial encounter 08/14/2018   PONV (postoperative nausea and vomiting)    only on this surgery   Radial nerve palsy, 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   Right-sided    Unspecified venous (peripheral) insufficiency 11/09/2011   Venous aneurysm    Right side of neck   Past Surgical History:  Procedure Laterality Date   ABDOMINAL HYSTERECTOMY     BREAST MASS EXCISION     Bilateral breast mass excision ( Benign)   ORIF HUMERUS FRACTURE Right 05/17/2017   RADIOLOGY WITH ANESTHESIA N/A 11/03/2021   Procedure: MRI WITH BRAIN WITH AND WITHOUT,CERVICAL WITH AND WITHOUT  CONTRAST;  Surgeon: Radiologist, Medication, MD;  Location: [facility] OR;  Service: Radiology;  Laterality: N/A;   TENNIS ELBOW RELEASE/NIRSCHEL PROCEDURE Right 05/17/2017   Procedure: EXPLORATION AND EXPOSURE OF RADIAL NERVE RIGHT ARM;  Surgeon: Camella Fallow, MD;  Location: [facility] OR;  Service: Orthopedics;  Laterality: Right;   Patient Active Problem List   Diagnosis Date Noted   Sesamoiditis of right foot 02/07/2024   Capsulitis of metatarsophalangeal (MTP) joint of right foot 10/05/2023   De Quervain's tenosynovitis, left 08/23/2023   Moderate left ankle sprain 07/21/2023   Knee contusion 04/14/2022   Right foot drop 12/02/2021   Degenerative arthritis of right knee 11/27/2021   High risk medication use 02/17/2021   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   Urge incontinence 02/17/2021   Bilateral dry eyes 09/01/2020   Nuclear sclerosis of both eyes 09/01/2020   PONV (postoperative nausea and vomiting)    MS (multiple sclerosis)    Hypertension    Humerus fracture    Adopted    Degenerative arthritis of left knee 12/31/2019   Greater trochanteric bursitis, left 12/31/2019   Asthma 11/02/2019   Cough 11/02/2019   Carpal tunnel syndrome of left wrist 04/30/2019   Cubital tunnel syndrome 04/30/2019   Chronic fatigue 11/28/2018   Closed low lateral malleolus fracture, left, initial encounter 10/17/2018   Patellar subluxation, left, initial encounter 08/14/2018   Primary insomnia 05/16/2018   Migraine without aura and without status migrainosus, not intractable 02/09/2018   Gait disturbance 01/11/2018  Intractable migraine with aura without status migrainosus 01/11/2018   Aortic atherosclerosis 11/29/2017   Medication monitoring encounter 11/29/2017   Osteoporosis 11/29/2017   Essential hypertension, benign 11/29/2017   Pulmonary nodules 10/06/2017   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   Radial nerve palsy, 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   Humerus shaft fracture  05/17/2017   Closed fracture of shaft of right humerus 05/13/2017   Other fracture of shaft of right humerus, initial encounter for closed fracture 05/13/2017   Contusion of elbow and forearm, initial encounter 08/26/2016   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   Nonallopathic lesion of sacral region 10/23/2015   Nonallopathic lesion of pelvic region 10/23/2015   Nonallopathic lesion of lumbosacral region 10/23/2015   Biomechanical lesion, unspecified 10/23/2015   Fracture of fifth metatarsal bone 08/13/2015   Patella fracture 07/03/2015   Paresthesia 06/19/2015   Bilateral leg pain 12/18/2014   Palpitations 06/23/2012   Mixed hyperlipidemia 06/23/2012   Venous aneurysm    IBS (irritable bowel syndrome)    Multiple sclerosis, relapsing-remitting (HCC) 12/14/2011   Environmental allergies 12/14/2011   Episodic tension-type headache 12/14/2011   Venous (peripheral) insufficiency 11/09/2011   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  Rationale for Evaluation and Treatment: Rehabilitation  SUBJECTIVE:                                                                                                                                                                                             SUBJECTIVE STATEMENT:  Cynthia Nichols  Pt with a history of MS, follows with Dr. Vear at [facility]. Her next appointment with him is in August. She reports she does not do infusions for her MS. Historically her weak side has been her R side but she has noticed her LLE gradually getting weaker. She reports that she does have a brace for her RLE. She tries to walk 1.5 miles per day, this is the only time she wears her brace. She can tell that  she is wearing her tennis shoes more on her L side now. She is interested in seeing if she needs a brace for her L side.  Pt reports that at home she wears socks and tends to just scoot her feet along the floor. She also furniture surfs.  ***  Pt accompanied by: self, drove herself  PERTINENT HISTORY: PMH: asthma, HTN, IBS, migraines, MS, osteoporosis  PAIN:  Are  you having pain? Yes: NPRS scale: 0/10 at rest currently Pain location: L knee from fall, L hip pain comes and goes Pain description: toothache Aggravating factors: doing a lot, lots of movement Relieving factors: ibuprofen, massage  PRECAUTIONS: Fall  RED FLAGS: None   WEIGHT BEARING RESTRICTIONS: No  FALLS: Has patient fallen in last 6 months? Yes. Number of falls one recently - she tripped over her feet and injured her L knee, her husband had to help her back up  LIVING ENVIRONMENT: Lives with: lives with their spouse Lives in: House/apartment Stairs: Yes: External: 4-5 steps; on right going up one level once inside Has following equipment at home: Environmental Consultant - 2 wheeled, Environmental Consultant - 4 wheeled, and marathon oil  PLOF: Independent with basic ADLs, Independent with household mobility with device, Independent with gait, Independent with transfers, and Requires assistive device for independence  PATIENT GOALS: I want to keep up what strength I got try not to wear out a pair of shoes  OBJECTIVE:  Note: Objective measures were completed at Evaluation unless otherwise noted.  DIAGNOSTIC FINDINGS: Nothing in chart related to this POC  COGNITION: Overall cognitive status: Within functional limits for tasks assessed   SENSATION: Gets burning sensation in feet Decreased light touch in distal RLE  COORDINATION: Impaired in BLE  EDEMA:  Swelling comes and goes  POSTURE: rounded shoulders, forward head, and increased thoracic kyphosis  LOWER EXTREMITY ROM:   WFL  Active  Right Eval Left Eval  Hip flexion    Hip  extension    Hip abduction    Hip adduction    Hip internal rotation    Hip external rotation    Knee flexion    Knee extension    Ankle dorsiflexion    Ankle plantarflexion    Ankle inversion    Ankle eversion     (Blank rows = not tested)  LOWER EXTREMITY MMT:    MMT Right Eval Left Eval  Hip flexion 3 4+  Hip extension    Hip abduction    Hip adduction    Hip internal rotation    Hip external rotation    Knee flexion 3- 5  Knee extension 3 5  Ankle dorsiflexion 3 4  Ankle plantarflexion    Ankle inversion    Ankle eversion    (Blank rows = not tested)  BED MOBILITY:  Little bit of trouble; uses exercise step to get in/out; bed is elevated  TRANSFERS: Sit to stand: Modified independence  Assistive device utilized: None     Stand to sit: Modified independence  Assistive device utilized: None     Chair to chair: Modified independence  Assistive device utilized: None       RAMP:  Not tested  CURB:  Not tested  STAIRS:  STAIRS:  Level of Assistance: SBA  Stair Negotiation Technique: Alternating Pattern  with Bilateral Rails  Number of Stairs: 4   Height of Stairs: 6  Comments: decreased hip and knee flexion, decreased B ankle DF   GAIT: Findings:  Gait pattern: decreased hip/knee flexion- Right, decreased hip/knee flexion- Left, decreased ankle dorsiflexion- Right, decreased ankle dorsiflexion- Left, scissoring, and narrow BOS Distance walked: various clinic distances Assistive device utilized: hurrycane Level of assistance: Modified independence Comments: R hip appears weaker than L hip, decreased R ankle DF as compared to L side, mild path deviation   FUNCTIONAL TESTS:       PATIENT SURVEYS:  None assessed at eval  TREATMENT DATE:   TherAct  NMR  Gait/Orthotic Assessment Gait pattern: {gait  characteristics:25376} Distance walked: *** Assistive device utilized: {Assistive devices:23999} Level of assistance: {Levels of assistance:24026} Comments: ***    PATIENT EDUCATION: Education details: Eval findings, PT POC, results of OM and functional implications*** Person educated: Patient Education method: Explanation and Demonstration Education comprehension: verbalized understanding, returned demonstration, and needs further education  HOME EXERCISE PROGRAM: To be initiated  GOALS: Goals reviewed with patient? Yes  SHORT TERM GOALS: Target date: 10/26/2024   Pt will be independent with initial HEP for improved strength, balance, transfers and gait. Baseline: Goal status: INITIAL  2.  Pt to trial a L AFO to determine if she would benefit from bracing on this limb with gait. Baseline:  Goal status: INITIAL  3.  Pt will improve gait velocity to at least 2.0 ft/sec for improved gait efficiency and performance at mod I level  Baseline: 1.76 ft/sec with hurrycane mod I (1/14) Goal status: INITIAL  4.  Pt will improve FGA to 16/30 for decreased fall risk  Baseline: 12/30 (1/14) Goal status: INITIAL    LONG TERM GOALS: Target date: 11/16/2024   Pt will be independent with final HEP for improved strength, balance, transfers and gait. Baseline:  Goal status: INITIAL  2.  Pt to obtain L AFO if determined it is needed for improved gait mechanics and decreased fall risk. Baseline:  Goal status: INITIAL  3.  Pt will improve gait velocity to at least 2.25 ft/sec for improved gait efficiency and performance at mod I level  Baseline: 1.76 ft/sec with hurrycane mod I (1/14) Goal status: INITIAL  4.  Pt will improve FGA to 20/30 for decreased fall risk  Baseline: 12/30 (1/14) Goal status: INITIAL  5.  Pt will improve normal TUG to less than or equal to 13 seconds for improved functional mobility and decreased fall risk. Baseline: 14.25 sec with hurrycane mod I  (1/14) Goal status: INITIAL   ASSESSMENT:  CLINICAL IMPRESSION: Emphasis of skilled PT session*** Continue POC.    OBJECTIVE IMPAIRMENTS: Abnormal gait, decreased balance, decreased coordination, difficulty walking, decreased strength, impaired perceived functional ability, improper body mechanics, postural dysfunction, and pain.   ACTIVITY LIMITATIONS: carrying, lifting, bending, standing, squatting, stairs, and transfers  PARTICIPATION LIMITATIONS: community activity  PERSONAL FACTORS: Time since onset of injury/illness/exacerbation and 3+ comorbidities:   asthma, HTN, IBS, migraines, MS, osteoporosisare also affecting patient's functional outcome.   REHAB POTENTIAL: Good  CLINICAL DECISION MAKING: Evolving/moderate complexity  EVALUATION COMPLEXITY: Moderate  PLAN:  PT FREQUENCY: 2x/week  PT DURATION: 6 weeks  PLANNED INTERVENTIONS: 97164- PT Re-evaluation, 97750- Physical Performance Testing, 97110-Therapeutic exercises, 97530- Therapeutic activity, [id number]- Neuromuscular re-education, 97535- Self Care, 02859- Manual therapy, [id number]- Gait training, [phone]- Orthotic Initial, [phone]- Orthotic/Prosthetic subsequent, [phone]- Aquatic Therapy, [phone]- Electrical stimulation (manual), [phone] (1-2 muscles), 20561 (3+ muscles)- Dry Needling, Patient/Family education, Balance training, Stair training, Taping, Joint mobilization, DME instructions, Cryotherapy, and Moist heat  PLAN FOR NEXT SESSION: did she bring tennis shoes and R AFO? Trial L AFOs, initiate HEP to work on balance and functional strengthening: sit to stands, squats, glutes, stairs***   Waddell Southgate, PT Waddell Southgate, PT, DPT, CSRS  10/10/2024, 7:23 AM        "

## 2024-10-12 ENCOUNTER — Ambulatory Visit: Admitting: Physical Therapy

## 2024-10-12 NOTE — Progress Notes (Unsigned)
 " [facility] [address] [place] [address] Phone: [phone] Subjective:    I'm seeing this patient by the request  of:  Ina Marcellus RAMAN, MD  CC:   YEP:Dlagzrupcz  07/19/2024 Patient given injection and tolerated the procedure well, increase activity slowly.  We discussed bracing at night will be more beneficial.  Increase activity slowly.  Follow-up again in 6 to 8 weeks patient is seeing surgeon and would like to avoid that at the moment.    Patient has had some difficulty walking, been using more of a regular cane with causing more wrist pain, written for forearm crutches.   Updated 10/15/2024 Cynthia Nichols is a 64 y.o. female coming in with complaint of MS and wrist pain.      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   Essential hypertension, benign 11/29/2017   Fracture of fifth metatarsal bone 08/13/2015   Greater trochanteric bursitis, left 12/31/2019   Injection given December 31, 2019   Headache(784.0)    Humerus fracture    right with radial nerve palsy   Hypertension    IBS (irritable bowel syndrome)    Migraine without aura and without status migrainosus, not intractable 02/09/2018   MS (multiple sclerosis)    Multiple sclerosis, relapsing-remitting 12/14/2011   Osteoporosis 11/29/2017   Other fracture of shaft of right humerus, initial encounter for closed fracture 05/13/2017   Patella fracture 07/03/2015   Hairline oblique     Patellar subluxation, left, initial encounter 08/14/2018   PONV (postoperative nausea and vomiting)    only on this surgery   Radial nerve  palsy, 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   Right-sided    Unspecified venous (peripheral) insufficiency 11/09/2011   Venous aneurysm    Right side of neck   Past Surgical History:  Procedure Laterality Date   ABDOMINAL HYSTERECTOMY     BREAST MASS EXCISION     Bilateral breast mass excision ( Benign)   ORIF HUMERUS FRACTURE Right 05/17/2017   RADIOLOGY WITH ANESTHESIA N/A 11/03/2021   Procedure: MRI WITH BRAIN WITH AND WITHOUT,CERVICAL WITH AND WITHOUT CONTRAST;  Surgeon: Radiologist, Medication, MD;  Location: [facility] OR;  Service: Radiology;  Laterality: N/A;   TENNIS ELBOW RELEASE/NIRSCHEL PROCEDURE Right 05/17/2017   Procedure: EXPLORATION AND EXPOSURE OF RADIAL NERVE RIGHT ARM;  Surgeon: Camella Fallow, MD;  Location: [facility] OR;  Service: Orthopedics;  Laterality: Right;   Social History   Socioeconomic History   Marital status: Married    Spouse name: Rocio Roam   Number of children: 3   Years of education: Not on file   Highest education level: Associate degree: occupational, scientist, product/process development, or vocational program  Occupational History   Occupation: disabled    Comment: [employer]  Tobacco Use   Smoking status: Never   Smokeless tobacco: Never  Vaping Use   Vaping status: Never Used  Substance and Sexual Activity   Alcohol use: No    Alcohol/week: 0.0 standard drinks of alcohol   Drug use: No   Sexual activity: Yes    Partners:  Male  Other Topics Concern   Not on file  Social History Narrative   1 cup of coffee daily    Social Drivers of Health   Tobacco Use: Low Risk (09/05/2024)   Patient History    Smoking Tobacco Use: Never    Smokeless Tobacco Use: Never    Passive Exposure: Not on file  Financial Resource Strain: Not on file  Food Insecurity: Not on file  Transportation Needs: Not on file  Physical Activity: Not on file  Stress: Not on file  Social Connections: Not on file  Depression (EYV7-0): Not on file  Alcohol Screen: Not on file   Housing: Not on file  Utilities: Not on file  Health Literacy: Not on file   Allergies[1] Family History  Adopted: Yes    Current Outpatient Medications (Cardiovascular):    bisoprolol  (ZEBETA ) 5 MG tablet, Take 1/2 tablet (2.5 mg total) by mouth in the morning and at bedtime.   bisoprolol  (ZEBETA ) 5 MG tablet, Take 1/2-1 tablet (2.5-5 mg total) by mouth daily for elevated blood pressure   candesartan  (ATACAND ) 8 MG tablet, Take 1 tablet (8 mg total) by mouth daily.   candesartan  (ATACAND ) 8 MG tablet, Take 1 tablet (8 mg total) by mouth daily as needed for blood pressure.   candesartan  (ATACAND ) 8 MG tablet, Take 1 tablet (8 mg total) by mouth daily as needed for blood pressure - rarely uses   hydrochlorothiazide  (MICROZIDE ) 12.5 MG capsule, Take 1 capsule (12.5 mg total) by mouth daily  for blood pressure.   pravastatin  (PRAVACHOL ) 20 MG tablet, TAKE 1 TABLET BY MOUTH DAILY (Patient taking differently: Take 20 mg by mouth at bedtime.)   pravastatin  (PRAVACHOL ) 20 MG tablet, Take 1 tablet (20 mg total) by mouth daily.   pravastatin  (PRAVACHOL ) 20 MG tablet, Take 1 tablet (20 mg total) by mouth daily.   pravastatin  (PRAVACHOL ) 20 MG tablet, Take 1 tablet (20 mg total) by mouth daily.  Current Outpatient Medications (Respiratory):    albuterol  (PROVENTIL ) (2.5 MG/3ML) 0.083% nebulizer solution, USE 1 VIAL VIA NEBULIZER EVERY 4 TO 6 HOURS AS NEEDED FOR SHORTNESS OF BREATH KATHEEN  Current Outpatient Medications (Other):    amphetamine -dextroamphetamine  (ADDERALL) 10 MG tablet, Take 1 tablet (10 mg total) by mouth 2 (two) times daily.   buPROPion  (WELLBUTRIN ) 100 MG tablet, Take 1 tablet (100 mg total) by mouth daily with breakfast.   Cholecalciferol (VITAMIN D ) 50 MCG (2000 UT) CAPS, Take 5,000 Units by mouth every 30 (thirty) days.   FLUoxetine  (PROZAC ) 20 MG capsule, Take 1 capsule (20 mg total) by mouth daily.   gabapentin  (NEURONTIN ) 300 MG capsule, Take 1 capsule (300 mg total) by  mouth 3 (three) times daily.   hydrOXYzine  (VISTARIL ) 25 MG capsule, Take 1 capsule (25 mg total) by mouth 1 - 2 (two) times daily as needed.   triamcinolone  cream (KENALOG ) 0.5 %, Apply 1 Application topically 2 (two) times daily. To affected areas.   zolpidem  (AMBIEN  CR) 6.25 MG CR tablet, Take 1 tablet by mouth at bedtime   zolpidem  (AMBIEN  CR) 6.25 MG CR tablet, Take 1 tablet (6.25 mg total) by mouth at bedtime.   Reviewed prior external information including notes and imaging from  primary care provider As well as notes that were available from care everywhere and other healthcare systems.  Past medical history, social, surgical and family history all reviewed in electronic medical record.  No pertanent information unless stated regarding to the chief complaint.   Review of  Systems:  No headache, visual changes, nausea, vomiting, diarrhea, constipation, dizziness, abdominal pain, skin rash, fevers, chills, night sweats, weight loss, swollen lymph nodes, body aches, joint swelling, chest pain, shortness of breath, mood changes. POSITIVE muscle aches  Objective  There were no vitals taken for this visit.   General: No apparent distress alert and oriented x3 mood and affect normal, dressed appropriately.  HEENT: Pupils equal, extraocular movements intact  Respiratory: Patient's speak in full sentences and does not appear short of breath  Cardiovascular: No lower extremity edema, non tender, no erythema      Impression and Recommendations:           [1]  Allergies Allergen Reactions   Ampyra [Dalfampridine] Anaphylaxis and Shortness Of Breath    Chest pain, also    Imitrex [Sumatriptan] Other (See Comments)    Chest tightness   Crestor  [Rosuvastatin  Calcium ] Other (See Comments)    Abdominal pain, shortness of breath   Peanut-Containing Drug Products Itching    Itching in the mouth (remarked that she still eats this in limited amounts)   Shrimp [Shellfish Allergy]  Itching    Itching in the mouth (remarked that she still eats this in limited amounts)   "

## 2024-10-15 ENCOUNTER — Ambulatory Visit: Admitting: Family Medicine

## 2024-10-15 NOTE — Telephone Encounter (Signed)
 Rescheduled by patient

## 2024-10-16 ENCOUNTER — Ambulatory Visit: Admitting: Physical Therapy

## 2024-10-16 ENCOUNTER — Other Ambulatory Visit (HOSPITAL_COMMUNITY): Payer: Self-pay

## 2024-10-16 ENCOUNTER — Other Ambulatory Visit: Payer: Self-pay

## 2024-10-16 NOTE — Progress Notes (Unsigned)
 " [facility] [address] [place] [address] Phone: [phone] Subjective:   Cynthia Nichols Berwyn Posey, am serving as a scribe for Dr. Arthea Claudene.  I'm seeing this patient by the request  of:  Ina Marcellus RAMAN, MD  CC: Left knee and left hip pain  YEP:Dlagzrupcz  07/19/2024 Patient has had some difficulty walking, been using more of a regular cane with causing more wrist pain, written for forearm crutches.     Patient given injection and tolerated the procedure well, increase activity slowly.  We discussed bracing at night will be more beneficial.  Increase activity slowly.  Follow-up again in 6 to 8 weeks patient is seeing surgeon and would like to avoid that at the moment.     Update 10/18/2024 Cynthia Nichols is a 64 y.o. female coming in with complaint of L knee and L hip pain.  Past medical history significant for multiple sclerosis.  Ambulates with the aid of crutches.  Patient has been in physical therapy and just had an appointment yesterday.  Patient states that her L piriformis has been bothering her more but on an intermittent basis.   Patient did fall and lateral aspect of knee was very painful after this but pain lessened but still present.      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   Essential hypertension, benign 11/29/2017   Fracture of fifth metatarsal bone 08/13/2015   Greater trochanteric bursitis, left 12/31/2019   Injection given December 31, 2019   Headache(784.0)    Humerus fracture    right with radial nerve palsy   Hypertension    IBS (irritable bowel syndrome)     Migraine without aura and without status migrainosus, not intractable 02/09/2018   MS (multiple sclerosis)    Multiple sclerosis, relapsing-remitting 12/14/2011   Osteoporosis 11/29/2017   Other fracture of shaft of right humerus, initial encounter for closed fracture 05/13/2017   Patella fracture 07/03/2015   Hairline oblique     Patellar subluxation, left, initial encounter 08/14/2018   PONV (postoperative nausea and vomiting)    only on this surgery   Radial nerve palsy, 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   Right-sided    Unspecified venous (peripheral) insufficiency 11/09/2011   Venous aneurysm    Right side of neck   Past Surgical History:  Procedure Laterality Date   ABDOMINAL HYSTERECTOMY     BREAST MASS EXCISION     Bilateral breast mass excision ( Benign)   ORIF HUMERUS FRACTURE Right 05/17/2017   RADIOLOGY WITH ANESTHESIA N/A 11/03/2021   Procedure: MRI WITH BRAIN WITH AND WITHOUT,CERVICAL WITH AND WITHOUT CONTRAST;  Surgeon: Radiologist, Medication, MD;  Location: [facility] OR;  Service: Radiology;  Laterality: N/A;   TENNIS ELBOW RELEASE/NIRSCHEL PROCEDURE Right 05/17/2017   Procedure: EXPLORATION AND EXPOSURE OF RADIAL NERVE RIGHT ARM;  Surgeon: Camella Fallow, MD;  Location: [facility] OR;  Service: Orthopedics;  Laterality: Right;   Social History   Socioeconomic History   Marital status: Married    Spouse name: Maryama Kuriakose   Number of children:  3   Years of education: Not on file   Highest education level: Associate degree: occupational, scientist, product/process development, or vocational program  Occupational History   Occupation: disabled    Comment: [employer]  Tobacco Use   Smoking status: Never   Smokeless tobacco: Never  Vaping Use   Vaping status: Never Used  Substance and Sexual Activity   Alcohol use: No    Alcohol/week: 0.0 standard drinks of alcohol   Drug use: No   Sexual activity: Yes    Partners: Male  Other Topics Concern   Not on file  Social History  Narrative   1 cup of coffee daily    Social Drivers of Health   Tobacco Use: Low Risk (09/05/2024)   Patient History    Smoking Tobacco Use: Never    Smokeless Tobacco Use: Never    Passive Exposure: Not on file  Financial Resource Strain: Not on file  Food Insecurity: Not on file  Transportation Needs: Not on file  Physical Activity: Not on file  Stress: Not on file  Social Connections: Not on file  Depression (EYV7-0): Not on file  Alcohol Screen: Not on file  Housing: Not on file  Utilities: Not on file  Health Literacy: Not on file   Allergies[1] Family History  Adopted: Yes    Current Outpatient Medications (Cardiovascular):    bisoprolol  (ZEBETA ) 5 MG tablet, Take 1/2 tablet (2.5 mg total) by mouth in the morning and at bedtime.   bisoprolol  (ZEBETA ) 5 MG tablet, Take 1/2-1 tablet (2.5-5 mg total) by mouth daily for elevated blood pressure   bisoprolol  (ZEBETA ) 5 MG tablet, Take 1/2-1 Tablet daily for elevated blood pressure   candesartan  (ATACAND ) 8 MG tablet, Take 1 tablet (8 mg total) by mouth daily.   candesartan  (ATACAND ) 8 MG tablet, Take 1 tablet (8 mg total) by mouth daily as needed for blood pressure.   candesartan  (ATACAND ) 8 MG tablet, Take 1 tablet (8 mg total) by mouth daily as needed for blood pressure - rarely uses   hydrochlorothiazide  (MICROZIDE ) 12.5 MG capsule, Take 1 capsule (12.5 mg total) by mouth daily  for blood pressure.   pravastatin  (PRAVACHOL ) 20 MG tablet, TAKE 1 TABLET BY MOUTH DAILY (Patient taking differently: Take 20 mg by mouth at bedtime.)   pravastatin  (PRAVACHOL ) 20 MG tablet, Take 1 tablet (20 mg total) by mouth daily.   pravastatin  (PRAVACHOL ) 20 MG tablet, Take 1 tablet (20 mg total) by mouth daily.   pravastatin  (PRAVACHOL ) 20 MG tablet, Take 1 tablet (20 mg total) by mouth daily.  Current Outpatient Medications (Respiratory):    albuterol  (PROVENTIL ) (2.5 MG/3ML) 0.083% nebulizer solution, USE 1 VIAL VIA NEBULIZER EVERY 4 TO 6  HOURS AS NEEDED FOR SHORTNESS OF BREATH KATHEEN  Current Outpatient Medications (Other):    amphetamine -dextroamphetamine  (ADDERALL) 10 MG tablet, Take 1 tablet (10 mg total) by mouth 2 (two) times daily.   buPROPion  (WELLBUTRIN ) 100 MG tablet, Take 1 tablet (100 mg total) by mouth daily with breakfast.   Cholecalciferol (VITAMIN D ) 50 MCG (2000 UT) CAPS, Take 5,000 Units by mouth every 30 (thirty) days.   FLUoxetine  (PROZAC ) 20 MG capsule, Take 1 capsule (20 mg total) by mouth daily.   gabapentin  (NEURONTIN ) 300 MG capsule, Take 1 capsule (300 mg total) by mouth 3 (three) times daily.   hydrOXYzine  (VISTARIL ) 25 MG capsule, Take 1 capsule (25 mg total) by mouth 1 - 2 (two) times daily as needed.   triamcinolone  cream (KENALOG ) 0.5 %, Apply 1  Application topically 2 (two) times daily. To affected areas.   zolpidem  (AMBIEN  CR) 6.25 MG CR tablet, Take 1 tablet by mouth at bedtime   zolpidem  (AMBIEN  CR) 6.25 MG CR tablet, Take 1 tablet (6.25 mg total) by mouth at bedtime.   Reviewed prior external information including notes and imaging from  primary care provider As well as notes that were available from care everywhere and other healthcare systems.  Past medical history, social, surgical and family history all reviewed in electronic medical record.  No pertanent information unless stated regarding to the chief complaint.   Review of Systems:  No headache, visual changes, nausea, vomiting, diarrhea, constipation, dizziness, abdominal pain, skin rash, fevers, chills, night sweats, weight loss, swollen lymph nodes, body aches, joint swelling, chest pain, shortness of breath, mood changes. POSITIVE muscle aches, body aches  Objective  Blood pressure 118/88, pulse 64, height 5' 7 (1.702 m), weight 160 lb (72.6 kg), SpO2 100%.   General: No apparent distress alert and oriented x3 mood and affect normal, dressed appropriately.  HEENT: Pupils equal, extraocular movements intact  Respiratory:  Patient's speak in full sentences and does not appear short of breath  Cardiovascular: No lower extremity edema, non tender, no erythema  Severely antalgic gait noted.  Patient does have weakness of both lower extremities.  Patient does have weakness of the hip girdle noted. Left knee does have tenderness to palpation with lateral crepitus and movement of the patella with a positive grind test.  After informed written and verbal consent, patient was seated on exam table. Left knee was prepped with alcohol swab and utilizing anterolateral approach, patient's left knee space was injected with 4:1  marcaine  0.5%: Kenalog  40mg /dL. Patient tolerated the procedure well without immediate complications.  After verbal consent patient was prepped with alcohol swab and with a 21-gauge 2 inch needle injected into the left sacroiliac joint with a total of 1 cc of 0.5% Marcaine  and 1 cc of Kenalog  40 mg/mL.  Minimal blood loss.  Band-Aid placed.  Postinjection instruction given.   Impression and Recommendations:    The above documentation has been reviewed and is accurate and complete Arthea CHRISTELLA Sharps, DO        [1]  Allergies Allergen Reactions   Ampyra [Dalfampridine] Anaphylaxis and Shortness Of Breath    Chest pain, also    Imitrex [Sumatriptan] Other (See Comments)    Chest tightness   Crestor  [Rosuvastatin  Calcium ] Other (See Comments)    Abdominal pain, shortness of breath   Peanut-Containing Drug Products Itching    Itching in the mouth (remarked that she still eats this in limited amounts)   Shrimp [Shellfish Allergy] Itching    Itching in the mouth (remarked that she still eats this in limited amounts)   "

## 2024-10-16 NOTE — Therapy (Incomplete)
 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   Essential hypertension, benign 11/29/2017   Fracture of fifth metatarsal bone 08/13/2015   Greater trochanteric bursitis, left 12/31/2019   Injection given December 31, 2019   Headache(784.0)    Humerus fracture    right with radial nerve palsy   Hypertension    IBS (irritable bowel syndrome)    Migraine without aura and without status migrainosus, not intractable 02/09/2018   MS (multiple sclerosis)    Multiple sclerosis, relapsing-remitting 12/14/2011   Osteoporosis 11/29/2017   Other fracture of shaft of right humerus, initial encounter for closed fracture 05/13/2017   Patella fracture 07/03/2015   Hairline oblique     Patellar subluxation, left, initial encounter 08/14/2018   PONV (postoperative nausea and vomiting)    only on this surgery   Radial nerve palsy, 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   Right-sided    Unspecified venous (peripheral) insufficiency 11/09/2011   Venous aneurysm    Right side of neck   Past Surgical History:  Procedure Laterality Date   ABDOMINAL HYSTERECTOMY     BREAST MASS EXCISION     Bilateral breast mass excision ( Benign)   ORIF HUMERUS FRACTURE Right 05/17/2017   RADIOLOGY WITH ANESTHESIA N/A 11/03/2021   Procedure: MRI WITH BRAIN WITH AND WITHOUT,CERVICAL WITH AND WITHOUT  CONTRAST;  Surgeon: Radiologist, Medication, MD;  Location: [facility] OR;  Service: Radiology;  Laterality: N/A;   TENNIS ELBOW RELEASE/NIRSCHEL PROCEDURE Right 05/17/2017   Procedure: EXPLORATION AND EXPOSURE OF RADIAL NERVE RIGHT ARM;  Surgeon: Camella Fallow, MD;  Location: [facility] OR;  Service: Orthopedics;  Laterality: Right;   Patient Active Problem List   Diagnosis Date Noted   Sesamoiditis of right foot 02/07/2024   Capsulitis of metatarsophalangeal (MTP) joint of right foot 10/05/2023   De Quervain's tenosynovitis, left 08/23/2023   Moderate left ankle sprain 07/21/2023   Knee contusion 04/14/2022   Right foot drop 12/02/2021   Degenerative arthritis of right knee 11/27/2021   High risk medication use 02/17/2021   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   Urge incontinence 02/17/2021   Bilateral dry eyes 09/01/2020   Nuclear sclerosis of both eyes 09/01/2020   PONV (postoperative nausea and vomiting)    MS (multiple sclerosis)    Hypertension    Humerus fracture    Adopted    Degenerative arthritis of left knee 12/31/2019   Greater trochanteric bursitis, left 12/31/2019   Asthma 11/02/2019   Cough 11/02/2019   Carpal tunnel syndrome of left wrist 04/30/2019   Cubital tunnel syndrome 04/30/2019   Chronic fatigue 11/28/2018   Closed low lateral malleolus fracture, left, initial encounter 10/17/2018   Patellar subluxation, left, initial encounter 08/14/2018   Primary insomnia 05/16/2018   Migraine without aura and without status migrainosus, not intractable 02/09/2018   Gait disturbance 01/11/2018  Intractable migraine with aura without status migrainosus 01/11/2018   Aortic atherosclerosis 11/29/2017   Medication monitoring encounter 11/29/2017   Osteoporosis 11/29/2017   Essential hypertension, benign 11/29/2017   Pulmonary nodules 10/06/2017   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   Radial nerve palsy, 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   Humerus shaft fracture  05/17/2017   Closed fracture of shaft of right humerus 05/13/2017   Other fracture of shaft of right humerus, initial encounter for closed fracture 05/13/2017   Contusion of elbow and forearm, initial encounter 08/26/2016   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   Nonallopathic lesion of sacral region 10/23/2015   Nonallopathic lesion of pelvic region 10/23/2015   Nonallopathic lesion of lumbosacral region 10/23/2015   Biomechanical lesion, unspecified 10/23/2015   Fracture of fifth metatarsal bone 08/13/2015   Patella fracture 07/03/2015   Paresthesia 06/19/2015   Bilateral leg pain 12/18/2014   Palpitations 06/23/2012   Mixed hyperlipidemia 06/23/2012   Venous aneurysm    IBS (irritable bowel syndrome)    Multiple sclerosis, relapsing-remitting (HCC) 12/14/2011   Environmental allergies 12/14/2011   Episodic tension-type headache 12/14/2011   Venous (peripheral) insufficiency 11/09/2011   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  Rationale for Evaluation and Treatment: Rehabilitation  SUBJECTIVE:                                                                                                                                                                                             SUBJECTIVE STATEMENT:  Cynthia Nichols  Pt with a history of MS, follows with Dr. Vear at [facility]. Her next appointment with him is in August. She reports she does not do infusions for her MS. Historically her weak side has been her R side but she has noticed her LLE gradually getting weaker. She reports that she does have a brace for her RLE. She tries to walk 1.5 miles per day, this is the only time she wears her brace. She can tell that  she is wearing her tennis shoes more on her L side now. She is interested in seeing if she needs a brace for her L side.  Pt reports that at home she wears socks and tends to just scoot her feet along the floor. She also furniture surfs.  ***  Pt accompanied by: self, drove herself  PERTINENT HISTORY: PMH: asthma, HTN, IBS, migraines, MS, osteoporosis  PAIN:  Are  you having pain? Yes: NPRS scale: 0/10 at rest currently Pain location: L knee from fall, L hip pain comes and goes Pain description: toothache Aggravating factors: doing a lot, lots of movement Relieving factors: ibuprofen, massage  PRECAUTIONS: Fall  RED FLAGS: None   WEIGHT BEARING RESTRICTIONS: No  FALLS: Has patient fallen in last 6 months? Yes. Number of falls one recently - she tripped over her feet and injured her L knee, her husband had to help her back up  LIVING ENVIRONMENT: Lives with: lives with their spouse Lives in: House/apartment Stairs: Yes: External: 4-5 steps; on right going up one level once inside Has following equipment at home: Environmental Consultant - 2 wheeled, Environmental Consultant - 4 wheeled, and marathon oil  PLOF: Independent with basic ADLs, Independent with household mobility with device, Independent with gait, Independent with transfers, and Requires assistive device for independence  PATIENT GOALS: I want to keep up what strength I got try not to wear out a pair of shoes  OBJECTIVE:  Note: Objective measures were completed at Evaluation unless otherwise noted.  DIAGNOSTIC FINDINGS: Nothing in chart related to this POC  COGNITION: Overall cognitive status: Within functional limits for tasks assessed   SENSATION: Gets burning sensation in feet Decreased light touch in distal RLE  COORDINATION: Impaired in BLE  EDEMA:  Swelling comes and goes  POSTURE: rounded shoulders, forward head, and increased thoracic kyphosis  LOWER EXTREMITY ROM:   WFL  Active  Right Eval Left Eval  Hip flexion    Hip  extension    Hip abduction    Hip adduction    Hip internal rotation    Hip external rotation    Knee flexion    Knee extension    Ankle dorsiflexion    Ankle plantarflexion    Ankle inversion    Ankle eversion     (Blank rows = not tested)  LOWER EXTREMITY MMT:    MMT Right Eval Left Eval  Hip flexion 3 4+  Hip extension    Hip abduction    Hip adduction    Hip internal rotation    Hip external rotation    Knee flexion 3- 5  Knee extension 3 5  Ankle dorsiflexion 3 4  Ankle plantarflexion    Ankle inversion    Ankle eversion    (Blank rows = not tested)  BED MOBILITY:  Little bit of trouble; uses exercise step to get in/out; bed is elevated  TRANSFERS: Sit to stand: Modified independence  Assistive device utilized: None     Stand to sit: Modified independence  Assistive device utilized: None     Chair to chair: Modified independence  Assistive device utilized: None       RAMP:  Not tested  CURB:  Not tested  STAIRS:  STAIRS:  Level of Assistance: SBA  Stair Negotiation Technique: Alternating Pattern  with Bilateral Rails  Number of Stairs: 4   Height of Stairs: 6  Comments: decreased hip and knee flexion, decreased B ankle DF   GAIT: Findings:  Gait pattern: decreased hip/knee flexion- Right, decreased hip/knee flexion- Left, decreased ankle dorsiflexion- Right, decreased ankle dorsiflexion- Left, scissoring, and narrow BOS Distance walked: various clinic distances Assistive device utilized: hurrycane Level of assistance: Modified independence Comments: R hip appears weaker than L hip, decreased R ankle DF as compared to L side, mild path deviation   FUNCTIONAL TESTS:       PATIENT SURVEYS:  None assessed at eval  TREATMENT DATE:   TherAct  NMR  Gait/Orthotic Assessment Gait pattern: {gait  characteristics:25376} Distance walked: *** Assistive device utilized: {Assistive devices:23999} Level of assistance: {Levels of assistance:24026} Comments: ***    PATIENT EDUCATION: Education details: Eval findings, PT POC, results of OM and functional implications*** Person educated: Patient Education method: Explanation and Demonstration Education comprehension: verbalized understanding, returned demonstration, and needs further education  HOME EXERCISE PROGRAM: To be initiated  GOALS: Goals reviewed with patient? Yes  SHORT TERM GOALS: Target date: 10/26/2024   Pt will be independent with initial HEP for improved strength, balance, transfers and gait. Baseline: Goal status: INITIAL  2.  Pt to trial a L AFO to determine if she would benefit from bracing on this limb with gait. Baseline:  Goal status: INITIAL  3.  Pt will improve gait velocity to at least 2.0 ft/sec for improved gait efficiency and performance at mod I level  Baseline: 1.76 ft/sec with hurrycane mod I (1/14) Goal status: INITIAL  4.  Pt will improve FGA to 16/30 for decreased fall risk  Baseline: 12/30 (1/14) Goal status: INITIAL    LONG TERM GOALS: Target date: 11/16/2024   Pt will be independent with final HEP for improved strength, balance, transfers and gait. Baseline:  Goal status: INITIAL  2.  Pt to obtain L AFO if determined it is needed for improved gait mechanics and decreased fall risk. Baseline:  Goal status: INITIAL  3.  Pt will improve gait velocity to at least 2.25 ft/sec for improved gait efficiency and performance at mod I level  Baseline: 1.76 ft/sec with hurrycane mod I (1/14) Goal status: INITIAL  4.  Pt will improve FGA to 20/30 for decreased fall risk  Baseline: 12/30 (1/14) Goal status: INITIAL  5.  Pt will improve normal TUG to less than or equal to 13 seconds for improved functional mobility and decreased fall risk. Baseline: 14.25 sec with hurrycane mod I  (1/14) Goal status: INITIAL   ASSESSMENT:  CLINICAL IMPRESSION: Emphasis of skilled PT session*** Continue POC.    OBJECTIVE IMPAIRMENTS: Abnormal gait, decreased balance, decreased coordination, difficulty walking, decreased strength, impaired perceived functional ability, improper body mechanics, postural dysfunction, and pain.   ACTIVITY LIMITATIONS: carrying, lifting, bending, standing, squatting, stairs, and transfers  PARTICIPATION LIMITATIONS: community activity  PERSONAL FACTORS: Time since onset of injury/illness/exacerbation and 3+ comorbidities:   asthma, HTN, IBS, migraines, MS, osteoporosisare also affecting patient's functional outcome.   REHAB POTENTIAL: Good  CLINICAL DECISION MAKING: Evolving/moderate complexity  EVALUATION COMPLEXITY: Moderate  PLAN:  PT FREQUENCY: 2x/week  PT DURATION: 6 weeks  PLANNED INTERVENTIONS: 97164- PT Re-evaluation, 97750- Physical Performance Testing, 97110-Therapeutic exercises, 97530- Therapeutic activity, [id number]- Neuromuscular re-education, 97535- Self Care, 02859- Manual therapy, [id number]- Gait training, [phone]- Orthotic Initial, [phone]- Orthotic/Prosthetic subsequent, [phone]- Aquatic Therapy, [phone]- Electrical stimulation (manual), [phone] (1-2 muscles), [id number] (3+ muscles)- Dry Needling, Patient/Family education, Balance training, Stair training, Taping, Joint mobilization, DME instructions, Cryotherapy, and Moist heat  PLAN FOR NEXT SESSION: did she bring tennis shoes and R AFO? Trial L AFOs, initiate HEP to work on balance and functional strengthening: sit to stands, squats, glutes, stairs***   Waddell Southgate, PT Waddell Southgate, PT, DPT, CSRS  10/16/2024, 8:25 AM        "

## 2024-10-17 ENCOUNTER — Ambulatory Visit: Admitting: Physical Therapy

## 2024-10-17 ENCOUNTER — Other Ambulatory Visit: Payer: Self-pay

## 2024-10-17 ENCOUNTER — Other Ambulatory Visit (HOSPITAL_COMMUNITY): Payer: Self-pay

## 2024-10-17 DIAGNOSIS — R2681 Unsteadiness on feet: Secondary | ICD-10-CM

## 2024-10-17 DIAGNOSIS — R278 Other lack of coordination: Secondary | ICD-10-CM

## 2024-10-17 DIAGNOSIS — R2689 Other abnormalities of gait and mobility: Secondary | ICD-10-CM

## 2024-10-17 DIAGNOSIS — M6281 Muscle weakness (generalized): Secondary | ICD-10-CM | POA: Diagnosis not present

## 2024-10-17 MED ORDER — BISOPROLOL FUMARATE 5 MG PO TABS
2.5000 mg | ORAL_TABLET | Freq: Every day | ORAL | 0 refills | Status: AC
  Filled 2024-10-17: qty 30, 30d supply, fill #0

## 2024-10-17 NOTE — Therapy (Signed)
 " OUTPATIENT PHYSICAL THERAPY NEURO TREATMENT   Patient Name: Cynthia Nichols MRN: [id number] DOB:1960-10-11, 64 y.o., female Today's Date: 10/17/2024   PCP: Ina Marcellus RAMAN, MD REFERRING PROVIDER: Whitfield Raisin, NP  END OF SESSION:  PT End of Session - 10/17/24 0934     Visit Number 2    Number of Visits 13   with eval   Date for Recertification  11/28/24   to allow for scheduling delays   Authorization Type HTA    PT Start Time 0930    PT Stop Time 1013    PT Time Calculation (min) 43 min    Equipment Utilized During Treatment Gait belt    Activity Tolerance Patient tolerated treatment well    Behavior During Therapy [facility] for tasks assessed/performed           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   Essential hypertension, benign 11/29/2017   Fracture of fifth metatarsal bone 08/13/2015   Greater trochanteric bursitis, left 12/31/2019   Injection given December 31, 2019   Headache(784.0)    Humerus fracture    right with radial nerve palsy   Hypertension    IBS (irritable bowel syndrome)    Migraine without aura and without status migrainosus, not intractable 02/09/2018   MS (multiple sclerosis)    Multiple sclerosis, relapsing-remitting 12/14/2011   Osteoporosis 11/29/2017   Other fracture of shaft of right humerus, initial encounter for closed fracture 05/13/2017   Patella fracture 07/03/2015   Hairline oblique     Patellar subluxation, left, initial encounter 08/14/2018   PONV (postoperative nausea and vomiting)    only on this surgery   Radial nerve palsy, 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   SI (sacroiliac) joint  dysfunction 10/23/2015   Right-sided    Unspecified venous (peripheral) insufficiency 11/09/2011   Venous aneurysm    Right side of neck   Past Surgical History:  Procedure Laterality Date   ABDOMINAL HYSTERECTOMY     BREAST MASS EXCISION     Bilateral breast mass excision ( Benign)   ORIF HUMERUS FRACTURE Right 05/17/2017   RADIOLOGY WITH ANESTHESIA N/A 11/03/2021   Procedure: MRI WITH BRAIN WITH AND WITHOUT,CERVICAL WITH AND WITHOUT CONTRAST;  Surgeon: Radiologist, Medication, MD;  Location: [facility] OR;  Service: Radiology;  Laterality: N/A;   TENNIS ELBOW RELEASE/NIRSCHEL PROCEDURE Right 05/17/2017   Procedure: EXPLORATION AND EXPOSURE OF RADIAL NERVE RIGHT ARM;  Surgeon: Camella Fallow, MD;  Location: [facility] OR;  Service: Orthopedics;  Laterality: Right;   Patient Active Problem List   Diagnosis Date Noted   Sesamoiditis of right foot 02/07/2024   Capsulitis of metatarsophalangeal (MTP) joint of right foot 10/05/2023   De Quervain's tenosynovitis, left 08/23/2023   Moderate left ankle sprain 07/21/2023   Knee contusion 04/14/2022   Right foot drop 12/02/2021   Degenerative arthritis of right knee 11/27/2021   High risk medication use 02/17/2021   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   Urge incontinence 02/17/2021   Bilateral dry eyes 09/01/2020   Nuclear sclerosis of both eyes 09/01/2020   PONV (postoperative nausea and vomiting)  MS (multiple sclerosis)    Hypertension    Humerus fracture    Adopted    Degenerative arthritis of left knee 12/31/2019   Greater trochanteric bursitis, left 12/31/2019   Asthma 11/02/2019   Cough 11/02/2019   Carpal tunnel syndrome of left wrist 04/30/2019   Cubital tunnel syndrome 04/30/2019   Chronic fatigue 11/28/2018   Closed low lateral malleolus fracture, left, initial encounter 10/17/2018   Patellar subluxation, left, initial encounter 08/14/2018   Primary insomnia 05/16/2018   Migraine without aura and without  status migrainosus, not intractable 02/09/2018   Gait disturbance 01/11/2018   Intractable migraine with aura without status migrainosus 01/11/2018   Aortic atherosclerosis 11/29/2017   Medication monitoring encounter 11/29/2017   Osteoporosis 11/29/2017   Essential hypertension, benign 11/29/2017   Pulmonary nodules 10/06/2017   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   Radial nerve palsy, 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   Humerus shaft fracture 05/17/2017   Closed fracture of shaft of right humerus 05/13/2017   Other fracture of shaft of right humerus, initial encounter for closed fracture 05/13/2017   Contusion of elbow and forearm, initial encounter 08/26/2016   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   Nonallopathic lesion of sacral region 10/23/2015   Nonallopathic lesion of pelvic region 10/23/2015   Nonallopathic lesion of lumbosacral region 10/23/2015   Biomechanical lesion, unspecified 10/23/2015   Fracture of fifth metatarsal bone 08/13/2015   Patella fracture 07/03/2015   Paresthesia 06/19/2015   Bilateral leg pain 12/18/2014   Palpitations 06/23/2012   Mixed hyperlipidemia 06/23/2012   Venous aneurysm    IBS (irritable bowel syndrome)    Multiple sclerosis, relapsing-remitting (HCC) 12/14/2011   Environmental allergies 12/14/2011   Episodic tension-type headache 12/14/2011   Venous (peripheral) insufficiency 11/09/2011   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   Leg pain 10/26/2011    ONSET DATE: 09/05/2024 (referral date)  REFERRING DIAG: G35.A (ICD-10-CM) - Multiple sclerosis, relapsing-remitting R26.9 (ICD-10-CM) - Gait disturbance R29.898 (ICD-10-CM) - Weakness of both lower extremities  THERAPY DIAG:  Muscle weakness (generalized)  Other abnormalities of gait and mobility  Other lack of coordination  Unsteadiness on feet  Rationale for Evaluation and Treatment: Rehabilitation  SUBJECTIVE:                                                                                                                                                                                              SUBJECTIVE STATEMENT:  Cynthia Nichols  Pt denies any acute changes since initial eval. She brings in her walking tennis shoes and R AFO this visit. She doesn't like her current R AFO, reports that it is a few  years ago. She also tends to wear out the toes of both of her shoes, wants to know if there is anything that can help with this.  Pt walks about 1.5 miles each day on sidewalks and on sand/gravel.  Pt accompanied by: self, drove herself  PERTINENT HISTORY: PMH: asthma, HTN, IBS, migraines, MS, osteoporosis  PAIN:  Are you having pain? Yes: NPRS scale: 0/10 at rest currently Pain location: L knee from fall, L hip pain comes and goes Pain description: toothache Aggravating factors: doing a lot, lots of movement Relieving factors: ibuprofen, massage  PRECAUTIONS: Fall  RED FLAGS: None   WEIGHT BEARING RESTRICTIONS: No  FALLS: Has patient fallen in last 6 months? Yes. Number of falls one recently - she tripped over her feet and injured her L knee, her husband had to help her back up  LIVING ENVIRONMENT: Lives with: lives with their spouse Lives in: House/apartment Stairs: Yes: External: 4-5 steps; on right going up one level once inside Has following equipment at home: Environmental Consultant - 2 wheeled, Environmental Consultant - 4 wheeled, and marathon oil  PLOF: Independent with basic ADLs, Independent with household mobility with device, Independent with gait, Independent with transfers, and Requires assistive device for independence  PATIENT GOALS: I want to keep up what strength I got try not to wear out a pair of shoes  OBJECTIVE:  Note: Objective measures were completed at Evaluation unless otherwise noted.  DIAGNOSTIC FINDINGS: Nothing in chart related to this POC  COGNITION: Overall cognitive status: Within functional limits for tasks assessed   SENSATION: Gets burning sensation  in feet Decreased light touch in distal RLE  COORDINATION: Impaired in BLE  EDEMA:  Swelling comes and goes  POSTURE: rounded shoulders, forward head, and increased thoracic kyphosis  LOWER EXTREMITY ROM:   WFL  Active  Right Eval Left Eval  Hip flexion    Hip extension    Hip abduction    Hip adduction    Hip internal rotation    Hip external rotation    Knee flexion    Knee extension    Ankle dorsiflexion    Ankle plantarflexion    Ankle inversion    Ankle eversion     (Blank rows = not tested)  LOWER EXTREMITY MMT:    MMT Right Eval Left Eval  Hip flexion 3 4+  Hip extension    Hip abduction    Hip adduction    Hip internal rotation    Hip external rotation    Knee flexion 3- 5  Knee extension 3 5  Ankle dorsiflexion 3 4  Ankle plantarflexion    Ankle inversion    Ankle eversion    (Blank rows = not tested)  BED MOBILITY:  Little bit of trouble; uses exercise step to get in/out; bed is elevated  TRANSFERS: Sit to stand: Modified independence  Assistive device utilized: None     Stand to sit: Modified independence  Assistive device utilized: None     Chair to chair: Modified independence  Assistive device utilized: None       RAMP:  Not tested  CURB:  Not tested  STAIRS:  STAIRS:  Level of Assistance: SBA  Stair Negotiation Technique: Alternating Pattern  with Bilateral Rails  Number of Stairs: 4   Height of Stairs: 6  Comments: decreased hip and knee flexion, decreased B ankle DF   GAIT: Findings:  Gait pattern: decreased hip/knee flexion- Right, decreased hip/knee flexion- Left, decreased ankle dorsiflexion- Right, decreased ankle dorsiflexion- Left,  scissoring, and narrow BOS Distance walked: various clinic distances Assistive device utilized: hurrycane Level of assistance: Modified independence Comments: R hip appears weaker than L hip, decreased R ankle DF as compared to L side, mild path deviation   FUNCTIONAL TESTS:        PATIENT SURVEYS:  None assessed at eval                                                                                                                              TREATMENT DATE:   Gait/Orthotic Assessment Gait pattern: decreased hip/knee flexion- Right, decreased hip/knee flexion- Left, decreased ankle dorsiflexion- Right, trendelenburg, and poor foot clearance- Right Distance walked: 230 ft Assistive device utilized: hurrycane Level of assistance: Modified independence Comments: with patient's current custom R AFO, does tend to drag her R foot even while wearing AFO, also her L hip/pelvis is higher than R hip/pelvis due to R glute weakness - Trendelburg gait  Gait pattern: milder Trendelburg and improved R ankle DF and scissoring Distance walked: 115 ft Assistive device utilized: hurrycane Level of assistance: Modified independence Comments: with Thusane/Townsend R AFO posterior with lateral strut - significantly improved R ankle DF with no toe scuffing, hips also more even as compared to with her current AFO  Gait pattern: milder Trendelburg and improved R ankle DF and scissoring Distance walked: 115 ft Assistive device utilized: forearm crutches Level of assistance: Modified independence Comments: with Townsend R AFO posterior with lateral strut, with forearm crutches - improved stability; does have mild R toe scuffing with fatigue  Gait pattern: milder Trendelburg and improved R ankle DF and scissoring Distance walked: 230 ft Assistive device utilized: forearm crutches Level of assistance: Modified independence Comments: with adjusted forearm crutches and Townsend R AFO posterior with lateral strut,  - improved stability; does have mild R toe scuffing with fatigue  After assessment this visit determined that patient can benefit from a new R AFO, preferably the Townsend posterior with lateral strut for improved RLE clearance during gait. Will assess her need for a L  AFO next visit then reach out to referring provider for an order. Also determined that she exhibits improved stability and gait mechanics with use of forearm crutches. Per patient she already has an order for these, just needs to take it to [facility]. Provided address and contact information for [facility] and encouraged patient to work on obtaining this AD.    PATIENT EDUCATION: Education details: see above Person educated: Patient Education method: Solicitor, and Verbal cues Education comprehension: verbalized understanding, returned demonstration, verbal cues required, and needs further education  HOME EXERCISE PROGRAM: To be initiated  GOALS: Goals reviewed with patient? Yes  SHORT TERM GOALS: Target date: 10/26/2024   Pt will be independent with initial HEP for improved strength, balance, transfers and gait. Baseline: Goal status: INITIAL  2.  Pt to trial a L AFO to determine if she would benefit from bracing on this  limb with gait. Baseline:  Goal status: INITIAL  3.  Pt will improve gait velocity to at least 2.0 ft/sec for improved gait efficiency and performance at mod I level  Baseline: 1.76 ft/sec with hurrycane mod I (1/14) Goal status: INITIAL  4.  Pt will improve FGA to 16/30 for decreased fall risk  Baseline: 12/30 (1/14) Goal status: INITIAL    LONG TERM GOALS: Target date: 11/16/2024   Pt will be independent with final HEP for improved strength, balance, transfers and gait. Baseline:  Goal status: INITIAL  2.  Pt to obtain L AFO if determined it is needed for improved gait mechanics and decreased fall risk. Baseline:  Goal status: INITIAL  3.  Pt will improve gait velocity to at least 2.25 ft/sec for improved gait efficiency and performance at mod I level  Baseline: 1.76 ft/sec with hurrycane mod I (1/14) Goal status: INITIAL  4.  Pt will improve FGA to 20/30 for decreased fall risk  Baseline: 12/30 (1/14) Goal status:  INITIAL  5.  Pt will improve normal TUG to less than or equal to 13 seconds for improved functional mobility and decreased fall risk. Baseline: 14.25 sec with hurrycane mod I (1/14) Goal status: INITIAL   ASSESSMENT:  CLINICAL IMPRESSION: Emphasis of skilled PT session on assessing patient's gait with her current R AFO and trialing a Townsend R posterior lateral strut AFO and trialing forearm crutches. Pt with improved gait mechanics and RLE clearance with use of Townsend AFO vs her current AFO, despite it being a custom brace. Additionally, she exhibits improved stability and gait mechanics with use of the forearm crutches. Did not assess LLE bracing needs this visit due to time constraints, plan to assess next visit and then will reach out to referring provider for an order pending her needs that will be determine next visit. As pt already has an order for forearm crutches encouraged her to work on obtaining these from a local medical supply store. She continues to benefit from skilled PT services to work towards BLE strengthening (particularly R hip abd) and trialing AFOs to determine the best fit for her current needs. Continue POC.    OBJECTIVE IMPAIRMENTS: Abnormal gait, decreased balance, decreased coordination, difficulty walking, decreased strength, impaired perceived functional ability, improper body mechanics, postural dysfunction, and pain.   ACTIVITY LIMITATIONS: carrying, lifting, bending, standing, squatting, stairs, and transfers  PARTICIPATION LIMITATIONS: community activity  PERSONAL FACTORS: Time since onset of injury/illness/exacerbation and 3+ comorbidities:   asthma, HTN, IBS, migraines, MS, osteoporosisare also affecting patient's functional outcome.   REHAB POTENTIAL: Good  CLINICAL DECISION MAKING: Evolving/moderate complexity  EVALUATION COMPLEXITY: Moderate  PLAN:  PT FREQUENCY: 2x/week  PT DURATION: 6 weeks  PLANNED INTERVENTIONS: 02835- PT Re-evaluation,  97750- Physical Performance Testing, 97110-Therapeutic exercises, 97530- Therapeutic activity, [id number]- Neuromuscular re-education, 97535- Self Care, [id number]- Manual therapy, [id number]- Gait training, [phone]- Orthotic Initial, [phone]- Orthotic/Prosthetic subsequent, [phone]- Aquatic Therapy, [phone]- Electrical stimulation (manual), [phone] (1-2 muscles), [phone] (3+ muscles)- Dry Needling, Patient/Family education, Balance training, Stair training, Taping, Joint mobilization, DME instructions, Cryotherapy, and Moist heat  PLAN FOR NEXT SESSION: she should bring in her AFO tennis shoes (HOKAs), Trial L AFOs with R Thusane/Townsend posterior lateral AFO, initiate HEP to work on balance and functional strengthening: sit to stands, squats, glutes, stairs, has trouble standing up from lower surfaces - especially when tired, R hip strengthening  Reach out to referring provider for AFO order pending her needs - she prefers it be sent to [facility] unless  she can work with Schuyler Waddell [place]gate, PT Waddell Southgate, PT, DPT, CSRS  10/17/2024, 10:15 AM        "

## 2024-10-18 ENCOUNTER — Encounter: Payer: Self-pay | Admitting: Family Medicine

## 2024-10-18 ENCOUNTER — Other Ambulatory Visit (HOSPITAL_COMMUNITY): Payer: Self-pay

## 2024-10-18 ENCOUNTER — Ambulatory Visit

## 2024-10-18 ENCOUNTER — Ambulatory Visit: Admitting: Family Medicine

## 2024-10-18 VITALS — BP 118/88 | HR 64 | Ht 67.0 in | Wt 160.0 lb

## 2024-10-18 DIAGNOSIS — M1712 Unilateral primary osteoarthritis, left knee: Secondary | ICD-10-CM

## 2024-10-18 DIAGNOSIS — M25562 Pain in left knee: Secondary | ICD-10-CM

## 2024-10-18 DIAGNOSIS — G8929 Other chronic pain: Secondary | ICD-10-CM | POA: Diagnosis not present

## 2024-10-18 DIAGNOSIS — M533 Sacrococcygeal disorders, not elsewhere classified: Secondary | ICD-10-CM | POA: Diagnosis not present

## 2024-10-18 DIAGNOSIS — M25552 Pain in left hip: Secondary | ICD-10-CM

## 2024-10-18 DIAGNOSIS — G35D Multiple sclerosis, unspecified: Secondary | ICD-10-CM | POA: Diagnosis not present

## 2024-10-18 NOTE — Assessment & Plan Note (Signed)
 Left-sided and seemed more angry.  Given an injection today.  Patient does have some arthritic changes noted on the left compared to the contralateral side.  Fairly significant.  Discussed icing regimen and home exercises, discussed which activities to do and which ones to avoid.  Increase activity slowly.  Follow-up again in 6 to 12 weeks

## 2024-10-18 NOTE — Assessment & Plan Note (Signed)
 Nearly 5 years since previous injection.  Discussed with patient about icing regimen and home exercises, discussed which activities to do and which ones to avoid.  Increase activity slowly.  Discussed icing regimen.  Follow-up with me again in 6 to 8 weeks.  Patient is doing formal physical therapy to help with balance and coordination.

## 2024-10-18 NOTE — Assessment & Plan Note (Signed)
 Likely contributing.  Discussed icing regimen and home exercises, discussed with patient about potential need for prednisone .  Following up with other providers as well.

## 2024-10-18 NOTE — Patient Instructions (Addendum)
 Injected L SI and L knee Keep working with PT Go get those poles See me again in 2 months

## 2024-10-19 ENCOUNTER — Telehealth: Payer: Self-pay | Admitting: Physical Therapy

## 2024-10-19 ENCOUNTER — Ambulatory Visit: Admitting: Physical Therapy

## 2024-10-19 VITALS — BP 128/66 | HR 59

## 2024-10-19 DIAGNOSIS — R2689 Other abnormalities of gait and mobility: Secondary | ICD-10-CM

## 2024-10-19 DIAGNOSIS — M6281 Muscle weakness (generalized): Secondary | ICD-10-CM

## 2024-10-19 DIAGNOSIS — R278 Other lack of coordination: Secondary | ICD-10-CM

## 2024-10-19 DIAGNOSIS — M21371 Foot drop, right foot: Secondary | ICD-10-CM

## 2024-10-19 DIAGNOSIS — R2681 Unsteadiness on feet: Secondary | ICD-10-CM

## 2024-10-19 DIAGNOSIS — G35A Relapsing-remitting multiple sclerosis: Secondary | ICD-10-CM

## 2024-10-19 NOTE — Therapy (Signed)
 " OUTPATIENT PHYSICAL THERAPY NEURO TREATMENT   Patient Name: Cynthia Nichols MRN: [id number] DOB:04-Dec-1960, 64 y.o., female Today's Date: 10/19/2024   PCP: Cynthia Marcellus RAMAN, MD REFERRING PROVIDER: Whitfield Raisin, NP  END OF SESSION:  PT End of Session - 10/19/24 0846     Visit Number 3    Number of Visits 13   with eval   Date for Recertification  11/28/24   to allow for scheduling delays   Authorization Type HTA    PT Start Time 0844    PT Stop Time 0930    PT Time Calculation (min) 46 min    Equipment Utilized During Treatment Other (comment)   AFOs   Activity Tolerance Patient tolerated treatment well    Behavior During Therapy 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   Essential hypertension, benign 11/29/2017   Fracture of fifth metatarsal bone 08/13/2015   Greater trochanteric bursitis, left 12/31/2019   Injection given December 31, 2019   Headache(784.0)    Humerus fracture    right with radial nerve palsy   Hypertension    IBS (irritable bowel syndrome)    Migraine without aura and without status migrainosus, not intractable 02/09/2018   MS (multiple sclerosis)    Multiple sclerosis, relapsing-remitting 12/14/2011   Osteoporosis 11/29/2017   Other fracture of shaft of right humerus, initial encounter for closed fracture 05/13/2017   Patella fracture 07/03/2015   Hairline oblique     Patellar subluxation, left, initial encounter 08/14/2018   PONV (postoperative nausea and vomiting)    only on this surgery   Radial nerve palsy, 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   SI  (sacroiliac) joint dysfunction 10/23/2015   Right-sided    Unspecified venous (peripheral) insufficiency 11/09/2011   Venous aneurysm    Right side of neck   Past Surgical History:  Procedure Laterality Date   ABDOMINAL HYSTERECTOMY     BREAST MASS EXCISION     Bilateral breast mass excision ( Benign)   ORIF HUMERUS FRACTURE Right 05/17/2017   RADIOLOGY WITH ANESTHESIA N/A 11/03/2021   Procedure: MRI WITH BRAIN WITH AND WITHOUT,CERVICAL WITH AND WITHOUT CONTRAST;  Surgeon: Radiologist, Medication, MD;  Location: [facility] OR;  Service: Radiology;  Laterality: N/A;   TENNIS ELBOW RELEASE/NIRSCHEL PROCEDURE Right 05/17/2017   Procedure: EXPLORATION AND EXPOSURE OF RADIAL NERVE RIGHT ARM;  Surgeon: Camella Fallow, MD;  Location: [facility] OR;  Service: Orthopedics;  Laterality: Right;   Patient Active Problem List   Diagnosis Date Noted   Sesamoiditis of right foot 02/07/2024   Capsulitis of metatarsophalangeal (MTP) joint of right foot 10/05/2023   De Quervain's tenosynovitis, left 08/23/2023   Moderate left ankle sprain 07/21/2023   Knee contusion 04/14/2022   Right foot drop 12/02/2021   Degenerative arthritis of right knee 11/27/2021   High risk medication use 02/17/2021   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   Urge incontinence 02/17/2021   Bilateral dry eyes 09/01/2020   Nuclear sclerosis of both eyes 09/01/2020   PONV (postoperative nausea and vomiting)  MS (multiple sclerosis)    Hypertension    Humerus fracture    Adopted    Degenerative arthritis of left knee 12/31/2019   Greater trochanteric bursitis, left 12/31/2019   Asthma 11/02/2019   Cough 11/02/2019   Carpal tunnel syndrome of left wrist 04/30/2019   Cubital tunnel syndrome 04/30/2019   Chronic fatigue 11/28/2018   Closed low lateral malleolus fracture, left, initial encounter 10/17/2018   Patellar subluxation, left, initial encounter 08/14/2018   Primary insomnia 05/16/2018   Migraine  without aura and without status migrainosus, not intractable 02/09/2018   Gait disturbance 01/11/2018   Intractable migraine with aura without status migrainosus 01/11/2018   Aortic atherosclerosis 11/29/2017   Medication monitoring encounter 11/29/2017   Osteoporosis 11/29/2017   Essential hypertension, benign 11/29/2017   Pulmonary nodules 10/06/2017   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   Radial nerve palsy, 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   Humerus shaft fracture 05/17/2017   Closed fracture of shaft of right humerus 05/13/2017   Other fracture of shaft of right humerus, initial encounter for closed fracture 05/13/2017   Contusion of elbow and forearm, initial encounter 08/26/2016   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   Nonallopathic lesion of sacral region 10/23/2015   Nonallopathic lesion of pelvic region 10/23/2015   Nonallopathic lesion of lumbosacral region 10/23/2015   Biomechanical lesion, unspecified 10/23/2015   Fracture of fifth metatarsal bone 08/13/2015   Patella fracture 07/03/2015   Paresthesia 06/19/2015   Bilateral leg pain 12/18/2014   Palpitations 06/23/2012   Mixed hyperlipidemia 06/23/2012   Venous aneurysm    IBS (irritable bowel syndrome)    Multiple sclerosis, relapsing-remitting (HCC) 12/14/2011   Environmental allergies 12/14/2011   Episodic tension-type headache 12/14/2011   Venous (peripheral) insufficiency 11/09/2011   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   Leg pain 10/26/2011    ONSET DATE: 09/05/2024 (referral date)  REFERRING DIAG: G35.A (ICD-10-CM) - Multiple sclerosis, relapsing-remitting R26.9 (ICD-10-CM) - Gait disturbance R29.898 (ICD-10-CM) - Weakness of both lower extremities  THERAPY DIAG:  Muscle weakness (generalized)  Other abnormalities of gait and mobility  Other lack of coordination  Unsteadiness on feet  Rationale for Evaluation and Treatment: Rehabilitation  SUBJECTIVE:                                                                                                                                                                                              SUBJECTIVE STATEMENT:  Cynthia Nichols  Pt presents holding her Hokas. States she received injections to her L knee and L SIJ yesterday, knee is sore today. Denies falls.   Obtained her Lofstrands yesterday, has not been able  to use them yet as the park is too icy to walk on.   Pt accompanied by: self, drove herself  PERTINENT HISTORY: PMH: asthma, HTN, IBS, migraines, MS, osteoporosis  PAIN:  Are you having pain? Yes: NPRS scale: 0/10 at rest currently Pain location: L knee from fall, L hip pain comes and goes Pain description: toothache Aggravating factors: doing a lot, lots of movement Relieving factors: ibuprofen, massage  PRECAUTIONS: Fall  RED FLAGS: None   WEIGHT BEARING RESTRICTIONS: No  FALLS: Has patient fallen in last 6 months? Yes. Number of falls one recently - she tripped over her feet and injured her L knee, her husband had to help her back up  LIVING ENVIRONMENT: Lives with: lives with their spouse Lives in: House/apartment Stairs: Yes: External: 4-5 steps; on right going up one level once inside Has following equipment at home: Environmental Consultant - 2 wheeled, Environmental Consultant - 4 wheeled, and marathon oil  PLOF: Independent with basic ADLs, Independent with household mobility with device, Independent with gait, Independent with transfers, and Requires assistive device for independence  PATIENT GOALS: I want to keep up what strength I got try not to wear out a pair of shoes  OBJECTIVE:  Note: Objective measures were completed at Evaluation unless otherwise noted.  DIAGNOSTIC FINDINGS: Nothing in chart related to this POC  COGNITION: Overall cognitive status: Within functional limits for tasks assessed   SENSATION: Gets burning sensation in feet Decreased light touch in distal RLE  COORDINATION: Impaired in BLE  EDEMA:  Swelling comes  and goes  POSTURE: rounded shoulders, forward head, and increased thoracic kyphosis  LOWER EXTREMITY ROM:   WFL  Active  Right Eval Left Eval  Hip flexion    Hip extension    Hip abduction    Hip adduction    Hip internal rotation    Hip external rotation    Knee flexion    Knee extension    Ankle dorsiflexion    Ankle plantarflexion    Ankle inversion    Ankle eversion     (Blank rows = not tested)  LOWER EXTREMITY MMT:    MMT Right Eval Left Eval  Hip flexion 3 4+  Hip extension    Hip abduction    Hip adduction    Hip internal rotation    Hip external rotation    Knee flexion 3- 5  Knee extension 3 5  Ankle dorsiflexion 3 4  Ankle plantarflexion    Ankle inversion    Ankle eversion    (Blank rows = not tested)  BED MOBILITY:  Little bit of trouble; uses exercise step to get in/out; bed is elevated  TRANSFERS: Sit to stand: Modified independence  Assistive device utilized: None     Stand to sit: Modified independence  Assistive device utilized: None     Chair to chair: Modified independence  Assistive device utilized: None       RAMP:  Not tested  CURB:  Not tested  STAIRS:  STAIRS:  Level of Assistance: SBA  Stair Negotiation Technique: Alternating Pattern  with Bilateral Rails  Number of Stairs: 4   Height of Stairs: 6  Comments: decreased hip and knee flexion, decreased B ankle DF   GAIT: Findings:  Gait pattern: decreased hip/knee flexion- Right, decreased hip/knee flexion- Left, decreased ankle dorsiflexion- Right, decreased ankle dorsiflexion- Left, scissoring, and narrow BOS Distance walked: various clinic distances Assistive device utilized: hurrycane Level of assistance: Modified independence Comments: R hip appears weaker than L hip,  decreased R ankle DF as compared to L side, mild path deviation   FUNCTIONAL TESTS:       PATIENT SURVEYS:  None assessed at eval                                                                                                                               TREATMENT DATE:   Gait Training  Gait pattern: Bilateral genu valgus, step through pattern, decreased step length- Right, Left hip hike, knee flexed in stance- Right, knee flexed in stance- Left, scissoring, lateral hip instability, narrow BOS, and poor foot clearance- Right Distance walked: 115' Assistive device utilized: Hurrycane and L [place] SpryStep AFO  Level of assistance: SBA Comments: Trialed use of L AFO in pt's Hokas and pt reported feeling different but good. Noted significant L hip hike to progress RLE   Gait pattern: Bilateral genu valgus, step through pattern, decreased stride length, decreased ankle dorsiflexion- Right, decreased ankle dorsiflexion- Left, Left hip hike, knee flexed in stance- Right, knee flexed in stance- Left, and scissoring Distance walked: 230'  Assistive device utilized: Hurrycane and L and R [place] SpryStep AFOs Level of assistance: Modified independence Comments: Noted improved upright posture and step length/clearance w/RLE w/B AFOs. Also noted increased gait speed. Pt reported feeling most stable w/use of B AFOs   Gait pattern: See above  Distance walked: 115'  Assistive device utilized: Hurrycane and L and R [place] SpryStep AFOs w/0.5 heel lift in R shoe Level of assistance: Modified independence Comments: Added heel lift under R AFO as pt reports feeling as though her RLE is shorter. W/heel lift, pt reported feeling more even and noted reduced L hip hike. Allowed pt to keep foam heel lift to try in various shoes at home.   Ther Act  Addressed all pt questions regarding process for obtaining AFO order, types of shoes that accommodate AFO and modifications that can be made to her R shoe to reduce toe drag:  Recommended pt try Altra shoes, as she prefers a barefoot shoe and Altra has a wide toe box that would accommodate an AFO. Wrote this down on a post-it for pt and recommended she  go to [facility] to try these on. Also recommended [place], but informed pt they have more cushion and she is not likely to prefer these. Recommended [employer] to try on [place].  Discussed process for obtaining AFO order and informed pt we would send it to Olivia at [facility]  Discussed adding toe cap and heel lift to R shoe to assist w/catching her foot and L hip hike. Will continue to monitor once pt obtains AFOs.     PATIENT EDUCATION: Education details: see ther act above Person educated: Patient Education method: Explanation, Demonstration, and Verbal cues Education comprehension: verbalized understanding, returned demonstration, verbal cues required, and needs further education  HOME EXERCISE PROGRAM: To be initiated  GOALS: Goals reviewed with patient? Yes  SHORT TERM GOALS: Target  date: 10/26/2024   Pt will be independent with initial HEP for improved strength, balance, transfers and gait. Baseline: Goal status: INITIAL  2.  Pt to trial a L AFO to determine if she would benefit from bracing on this limb with gait. Baseline: Trialed on 1/30 Goal status: MET  3.  Pt will improve gait velocity to at least 2.0 ft/sec for improved gait efficiency and performance at mod I level  Baseline: 1.76 ft/sec with hurrycane mod I (1/14) Goal status: INITIAL  4.  Pt will improve FGA to 16/30 for decreased fall risk  Baseline: 12/30 (1/14) Goal status: INITIAL    LONG TERM GOALS: Target date: 11/16/2024   Pt will be independent with final HEP for improved strength, balance, transfers and gait. Baseline:  Goal status: INITIAL  2.  Pt to obtain L AFO if determined it is needed for improved gait mechanics and decreased fall risk. Baseline:  Goal status: INITIAL  3.  Pt will improve gait velocity to at least 2.25 ft/sec for improved gait efficiency and performance at mod I level  Baseline: 1.76 ft/sec with hurrycane mod I (1/14) Goal status: INITIAL  4.  Pt will improve FGA to 20/30 for  decreased fall risk  Baseline: 12/30 (1/14) Goal status: INITIAL  5.  Pt will improve normal TUG to less than or equal to 13 seconds for improved functional mobility and decreased fall risk. Baseline: 14.25 sec with hurrycane mod I (1/14) Goal status: INITIAL   ASSESSMENT:  CLINICAL IMPRESSION: Emphasis of skilled PT session on assessing pt's gait w/L AFO and B AFOs and addressing pt's questions on shoes. Pt reported feeling most stable w/bilateral AFOs, so will request order from neurologist today. Recommended pt look into Altra shoes to wear with her AFOs due to the wide toe box and 0mm toe drop. Continue POC.    OBJECTIVE IMPAIRMENTS: Abnormal gait, decreased balance, decreased coordination, difficulty walking, decreased strength, impaired perceived functional ability, improper body mechanics, postural dysfunction, and pain.   ACTIVITY LIMITATIONS: carrying, lifting, bending, standing, squatting, stairs, and transfers  PARTICIPATION LIMITATIONS: community activity  PERSONAL FACTORS: Time since onset of injury/illness/exacerbation and 3+ comorbidities:   asthma, HTN, IBS, migraines, MS, osteoporosisare also affecting patient's functional outcome.   REHAB POTENTIAL: Good  CLINICAL DECISION MAKING: Evolving/moderate complexity  EVALUATION COMPLEXITY: Moderate  PLAN:  PT FREQUENCY: 2x/week  PT DURATION: 6 weeks  PLANNED INTERVENTIONS: 02835- PT Re-evaluation, 97750- Physical Performance Testing, 97110-Therapeutic exercises, 97530- Therapeutic activity, [id number]- Neuromuscular re-education, 97535- Self Care, 02859- Manual therapy, [phone]- Gait training, [phone]- Orthotic Initial, [phone]- Orthotic/Prosthetic subsequent, [phone]- Aquatic Therapy, [phone]- Electrical stimulation (manual), [phone] (1-2 muscles), [phone] (3+ muscles)- Dry Needling, Patient/Family education, Balance training, Stair training, Taping, Joint mobilization, DME instructions, Cryotherapy, and Moist heat  PLAN FOR NEXT  SESSION: she should bring in her AFO tennis shoes (HOKAs), Trial L AFOs with R Thusane/[place]send posterior lateral AFO, initiate HEP to work on balance and functional strengthening: sit to stands, squats, glutes, stairs, has trouble standing up from lower surfaces - especially when tired, R hip strengthening  Reach out to referring provider for AFO order pending her needs Werner requested on 10/19/24) - she prefers it be sent to [facility] unless she can work with [facility]   Victorian Gunn E Chelsey Kimberley, PT, DPT 10/19/2024, 9:43 AM        "

## 2024-10-19 NOTE — Telephone Encounter (Signed)
 Harlene Bogaert, NP,  Avelina LITTIE Ada  is being seen by PT for MS and would benefit from new bilateral AFOs  for reduced fall risk and energy conservation.   If you agree, please place an order in [facility] workque in [facility] or fax the order to [phone].  Thank you,  Marlon FORBES Dural, PT, DPT [facility] [address] [place], [place]  [address] Phone:  [phone] Fax:  [phone]

## 2024-10-22 ENCOUNTER — Ambulatory Visit: Payer: Self-pay | Admitting: Family Medicine

## 2024-10-24 ENCOUNTER — Ambulatory Visit: Admitting: Physical Therapy

## 2024-10-24 DIAGNOSIS — R2681 Unsteadiness on feet: Secondary | ICD-10-CM

## 2024-10-24 DIAGNOSIS — R278 Other lack of coordination: Secondary | ICD-10-CM

## 2024-10-24 DIAGNOSIS — M6281 Muscle weakness (generalized): Secondary | ICD-10-CM

## 2024-10-24 DIAGNOSIS — R2689 Other abnormalities of gait and mobility: Secondary | ICD-10-CM

## 2024-10-24 NOTE — Addendum Note (Signed)
 Addended by: WHITFIELD RAISIN L on: 10/24/2024 04:19 PM   Modules accepted: Orders

## 2024-10-24 NOTE — Telephone Encounter (Signed)
 Order placed as requested. If this is not entered correctly, please let me know and I can update it!

## 2024-10-24 NOTE — Therapy (Signed)
 " OUTPATIENT PHYSICAL THERAPY NEURO TREATMENT   Patient Name: Cynthia Nichols MRN: [id number] DOB:12/18/1960, 64 y.o., female Today's Date: 10/24/2024   PCP: Ina Marcellus RAMAN, MD REFERRING PROVIDER: Whitfield Raisin, NP  END OF SESSION:  PT End of Session - 10/24/24 1451     Visit Number 4    Number of Visits 13   with eval   Date for Recertification  11/28/24   to allow for scheduling delays   Authorization Type HTA    PT Start Time 1446    PT Stop Time 1524    PT Time Calculation (min) 38 min    Equipment Utilized During Treatment --    Activity Tolerance Patient tolerated treatment well    Behavior During Therapy [facility] for tasks assessed/performed             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   Essential hypertension, benign 11/29/2017   Fracture of fifth metatarsal bone 08/13/2015   Greater trochanteric bursitis, left 12/31/2019   Injection given December 31, 2019   Headache(784.0)    Humerus fracture    right with radial nerve palsy   Hypertension    IBS (irritable bowel syndrome)    Migraine without aura and without status migrainosus, not intractable 02/09/2018   MS (multiple sclerosis)    Multiple sclerosis, relapsing-remitting 12/14/2011   Osteoporosis 11/29/2017   Other fracture of shaft of right humerus, initial encounter for closed fracture 05/13/2017   Patella fracture 07/03/2015   Hairline oblique     Patellar subluxation, left, initial encounter 08/14/2018   PONV (postoperative nausea and vomiting)    only on this surgery   Radial nerve palsy, 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   SI (sacroiliac) joint  dysfunction 10/23/2015   Right-sided    Unspecified venous (peripheral) insufficiency 11/09/2011   Venous aneurysm    Right side of neck   Past Surgical History:  Procedure Laterality Date   ABDOMINAL HYSTERECTOMY     BREAST MASS EXCISION     Bilateral breast mass excision ( Benign)   ORIF HUMERUS FRACTURE Right 05/17/2017   RADIOLOGY WITH ANESTHESIA N/A 11/03/2021   Procedure: MRI WITH BRAIN WITH AND WITHOUT,CERVICAL WITH AND WITHOUT CONTRAST;  Surgeon: Radiologist, Medication, MD;  Location: [facility] OR;  Service: Radiology;  Laterality: N/A;   TENNIS ELBOW RELEASE/NIRSCHEL PROCEDURE Right 05/17/2017   Procedure: EXPLORATION AND EXPOSURE OF RADIAL NERVE RIGHT ARM;  Surgeon: Camella Fallow, MD;  Location: [facility] OR;  Service: Orthopedics;  Laterality: Right;   Patient Active Problem List   Diagnosis Date Noted   Sesamoiditis of right foot 02/07/2024   Capsulitis of metatarsophalangeal (MTP) joint of right foot 10/05/2023   De Quervain's tenosynovitis, left 08/23/2023   Moderate left ankle sprain 07/21/2023   Knee contusion 04/14/2022   Right foot drop 12/02/2021   Degenerative arthritis of right knee 11/27/2021   High risk medication use 02/17/2021   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   Urge incontinence 02/17/2021   Bilateral dry eyes 09/01/2020   Nuclear sclerosis of both eyes 09/01/2020   PONV (postoperative nausea and vomiting)  MS (multiple sclerosis)    Hypertension    Humerus fracture    Adopted    Degenerative arthritis of left knee 12/31/2019   Greater trochanteric bursitis, left 12/31/2019   Asthma 11/02/2019   Cough 11/02/2019   Carpal tunnel syndrome of left wrist 04/30/2019   Cubital tunnel syndrome 04/30/2019   Chronic fatigue 11/28/2018   Closed low lateral malleolus fracture, left, initial encounter 10/17/2018   Patellar subluxation, left, initial encounter 08/14/2018   Primary insomnia 05/16/2018   Migraine without aura and without  status migrainosus, not intractable 02/09/2018   Gait disturbance 01/11/2018   Intractable migraine with aura without status migrainosus 01/11/2018   Aortic atherosclerosis 11/29/2017   Medication monitoring encounter 11/29/2017   Osteoporosis 11/29/2017   Essential hypertension, benign 11/29/2017   Pulmonary nodules 10/06/2017   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   Radial nerve palsy, 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   Humerus shaft fracture 05/17/2017   Closed fracture of shaft of right humerus 05/13/2017   Other fracture of shaft of right humerus, initial encounter for closed fracture 05/13/2017   Contusion of elbow and forearm, initial encounter 08/26/2016   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   Nonallopathic lesion of sacral region 10/23/2015   Nonallopathic lesion of pelvic region 10/23/2015   Nonallopathic lesion of lumbosacral region 10/23/2015   Biomechanical lesion, unspecified 10/23/2015   Fracture of fifth metatarsal bone 08/13/2015   Patella fracture 07/03/2015   Paresthesia 06/19/2015   Bilateral leg pain 12/18/2014   Palpitations 06/23/2012   Mixed hyperlipidemia 06/23/2012   Venous aneurysm    IBS (irritable bowel syndrome)    Multiple sclerosis, relapsing-remitting (HCC) 12/14/2011   Environmental allergies 12/14/2011   Episodic tension-type headache 12/14/2011   Venous (peripheral) insufficiency 11/09/2011   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   Leg pain 10/26/2011    ONSET DATE: 09/05/2024 (referral date)  REFERRING DIAG: G35.A (ICD-10-CM) - Multiple sclerosis, relapsing-remitting R26.9 (ICD-10-CM) - Gait disturbance R29.898 (ICD-10-CM) - Weakness of both lower extremities  THERAPY DIAG:  Muscle weakness (generalized)  Other abnormalities of gait and mobility  Other lack of coordination  Unsteadiness on feet  Rationale for Evaluation and Treatment: Rehabilitation  SUBJECTIVE:                                                                                                                                                                                              SUBJECTIVE STATEMENT:  Cynthia Nichols  Pt presents holding her Hokas. States she received injections to her L knee and L SIJ yesterday, knee is sore today. Denies falls.   Obtained her Lofstrands yesterday, has not been able  to use them yet as the park is too icy to walk on.   ***Pt denies any falls or acute changes since last visit. Pt with a HA today, no other pain.  Pt got her Lofstrand crutches, was able to use them once; pt's palms did start to hurt after using them - can bring them next visit and look into padding/cushiong for them and ensure they are the correct height  Pt accompanied by: self, drove herself  PERTINENT HISTORY: PMH: asthma, HTN, IBS, migraines, MS, osteoporosis  PAIN:  Are you having pain? Yes: NPRS scale: 0/10 at rest currently Pain location: L knee from fall, L hip pain comes and goes Pain description: toothache Aggravating factors: doing a lot, lots of movement Relieving factors: ibuprofen, massage  PRECAUTIONS: Fall  RED FLAGS: None   WEIGHT BEARING RESTRICTIONS: No  FALLS: Has patient fallen in last 6 months? Yes. Number of falls one recently - she tripped over her feet and injured her L knee, her husband had to help her back up  LIVING ENVIRONMENT: Lives with: lives with their spouse Lives in: House/apartment Stairs: Yes: External: 4-5 steps; on right going up one level once inside Has following equipment at home: Environmental Consultant - 2 wheeled, Environmental Consultant - 4 wheeled, and marathon oil  PLOF: Independent with basic ADLs, Independent with household mobility with device, Independent with gait, Independent with transfers, and Requires assistive device for independence  PATIENT GOALS: I want to keep up what strength I got try not to wear out a pair of shoes  OBJECTIVE:  Note: Objective measures were completed at Evaluation unless otherwise  noted.  DIAGNOSTIC FINDINGS: Nothing in chart related to this POC  COGNITION: Overall cognitive status: Within functional limits for tasks assessed   SENSATION: Gets burning sensation in feet Decreased light touch in distal RLE  COORDINATION: Impaired in BLE  EDEMA:  Swelling comes and goes  POSTURE: rounded shoulders, forward head, and increased thoracic kyphosis  LOWER EXTREMITY ROM:   WFL  Active  Right Eval Left Eval  Hip flexion    Hip extension    Hip abduction    Hip adduction    Hip internal rotation    Hip external rotation    Knee flexion    Knee extension    Ankle dorsiflexion    Ankle plantarflexion    Ankle inversion    Ankle eversion     (Blank rows = not tested)  LOWER EXTREMITY MMT:    MMT Right Eval Left Eval  Hip flexion 3 4+  Hip extension    Hip abduction    Hip adduction    Hip internal rotation    Hip external rotation    Knee flexion 3- 5  Knee extension 3 5  Ankle dorsiflexion 3 4  Ankle plantarflexion    Ankle inversion    Ankle eversion    (Blank rows = not tested)  BED MOBILITY:  Little bit of trouble; uses exercise step to get in/out; bed is elevated  TRANSFERS: Sit to stand: Modified independence  Assistive device utilized: None     Stand to sit: Modified independence  Assistive device utilized: None     Chair to chair: Modified independence  Assistive device utilized: None       RAMP:  Not tested  CURB:  Not tested  STAIRS:  STAIRS:  Level of Assistance: SBA  Stair Negotiation Technique: Alternating Pattern  with Bilateral Rails  Number of Stairs: 4   Height of Stairs: 6  Comments: decreased hip and knee flexion, decreased B ankle DF   GAIT: Findings:  Gait pattern: decreased hip/knee flexion- Right, decreased hip/knee flexion- Left, decreased ankle dorsiflexion- Right, decreased ankle dorsiflexion- Left, scissoring, and narrow BOS Distance walked: various clinic distances Assistive device utilized:  hurrycane Level of assistance: Modified independence Comments: R hip appears weaker than L hip, decreased R ankle DF as compared to L side, mild path deviation   FUNCTIONAL TESTS:       PATIENT SURVEYS:  None assessed at eval                                                                                                                              TREATMENT DATE:   Gait Training  Gait pattern: Bilateral genu valgus, step through pattern, decreased step length- Right, Left hip hike, knee flexed in stance- Right, knee flexed in stance- Left, scissoring, lateral hip instability, narrow BOS, and poor foot clearance- Right Distance walked: 115' Assistive device utilized: Hurrycane and L [place] SpryStep AFO  Level of assistance: SBA Comments: Trialed use of L AFO in pt's Hokas and pt reported feeling different but good. Noted significant L hip hike to progress RLE   Gait pattern: Bilateral genu valgus, step through pattern, decreased stride length, decreased ankle dorsiflexion- Right, decreased ankle dorsiflexion- Left, Left hip hike, knee flexed in stance- Right, knee flexed in stance- Left, and scissoring Distance walked: 230'  Assistive device utilized: Hurrycane and L and R [place] SpryStep AFOs Level of assistance: Modified independence Comments: Noted improved upright posture and step length/clearance w/RLE w/B AFOs. Also noted increased gait speed. Pt reported feeling most stable w/use of B AFOs   Gait pattern: See above  Distance walked: 115'  Assistive device utilized: Hurrycane and L and R [place] SpryStep AFOs w/0.5 heel lift in R shoe Level of assistance: Modified independence Comments: Added heel lift under R AFO as pt reports feeling as though her RLE is shorter. W/heel lift, pt reported feeling more even and noted reduced L hip hike. Allowed pt to keep foam heel lift to try in various shoes at home.   Gait pattern: {gait characteristics:25376} Distance walked:  *** Assistive device utilized: {Assistive devices:23999} Level of assistance: {Levels of assistance:24026} Comments: ***   ***  Ther Act  Addressed all pt questions regarding process for obtaining AFO order, types of shoes that accommodate AFO and modifications that can be made to her R shoe to reduce toe drag:  Recommended pt try Altra shoes, as she prefers a barefoot shoe and Altra has a wide toe box that would accommodate an AFO. Wrote this down on a post-it for pt and recommended she go to [facility] to try these on. Also recommended [place], but informed pt they have more cushion and she is not likely to prefer these. Recommended [employer] to try on [place].  Discussed process for obtaining AFO order and informed pt we would send it to Olivia at  [facility]  Discussed adding toe cap and heel lift to R shoe to assist w/catching her foot and L hip hike. Will continue to monitor once pt obtains AFOs.   Discussed tennis shoes (Altra's, Brooks) *** Pt has not been able to try out heel lift Potentially needs toe cap and R heel lift To work on B hip strengthening: SL clams 2 x 10 reps on R side More pain laying on L side Difficulty performing without rotating hips backwards 2 x 10 reps on R side Difficulty performing without rotating hips backwards Supine SKFO X 10 reps B   PATIENT EDUCATION: Education details: see ther act above*** Person educated: Patient Education method: Explanation, Demonstration, and Verbal cues Education comprehension: verbalized understanding, returned demonstration, verbal cues required, and needs further education  HOME EXERCISE PROGRAM: Access Code: [email or web address] URL: [email or web address] Date: 10/24/2024 Prepared by: Waddell Southgate  Exercises - Clamshell  - 1 x daily - 7 x weekly - 3 sets - 10 reps - Supine Single Bent Knee Fallout  - 1 x daily - 7 x weekly - 3 sets - 10 reps - Sidelying Diagonal Hip Abduction  - 1 x daily - 7 x weekly - 3 sets - 10  reps  GOALS: Goals reviewed with patient? Yes  SHORT TERM GOALS: Target date: 10/26/2024   Pt will be independent with initial HEP for improved strength, balance, transfers and gait. Baseline: Goal status: INITIAL  2.  Pt to trial a L AFO to determine if she would benefit from bracing on this limb with gait. Baseline: Trialed on 1/30 Goal status: MET  3.  Pt will improve gait velocity to at least 2.0 ft/sec for improved gait efficiency and performance at mod I level  Baseline: 1.76 ft/sec with hurrycane mod I (1/14) Goal status: INITIAL  4.  Pt will improve FGA to 16/30 for decreased fall risk  Baseline: 12/30 (1/14) Goal status: INITIAL    LONG TERM GOALS: Target date: 11/16/2024   Pt will be independent with final HEP for improved strength, balance, transfers and gait. Baseline:  Goal status: INITIAL  2.  Pt to obtain L AFO if determined it is needed for improved gait mechanics and decreased fall risk. Baseline:  Goal status: INITIAL  3.  Pt will improve gait velocity to at least 2.25 ft/sec for improved gait efficiency and performance at mod I level  Baseline: 1.76 ft/sec with hurrycane mod I (1/14) Goal status: INITIAL  4.  Pt will improve FGA to 20/30 for decreased fall risk  Baseline: 12/30 (1/14) Goal status: INITIAL  5.  Pt will improve normal TUG to less than or equal to 13 seconds for improved functional mobility and decreased fall risk. Baseline: 14.25 sec with hurrycane mod I (1/14) Goal status: INITIAL   ASSESSMENT:  CLINICAL IMPRESSION: Emphasis of skilled PT session on*** assessing pt's gait w/L AFO and B AFOs and addressing pt's questions on shoes. Pt reported feeling most stable w/bilateral AFOs, so will request order from neurologist today. Recommended pt look into Altra shoes to wear with her AFOs due to the wide toe box and 0mm toe drop. Continue POC.    OBJECTIVE IMPAIRMENTS: Abnormal gait, decreased balance, decreased coordination,  difficulty walking, decreased strength, impaired perceived functional ability, improper body mechanics, postural dysfunction, and pain.   ACTIVITY LIMITATIONS: carrying, lifting, bending, standing, squatting, stairs, and transfers  PARTICIPATION LIMITATIONS: community activity  PERSONAL FACTORS: Time since onset of injury/illness/exacerbation and 3+ comorbidities:   asthma, HTN, IBS, migraines, MS, osteoporosisare  also affecting patient's functional outcome.   REHAB POTENTIAL: Good  CLINICAL DECISION MAKING: Evolving/moderate complexity  EVALUATION COMPLEXITY: Moderate  PLAN:  PT FREQUENCY: 2x/week  PT DURATION: 6 weeks  PLANNED INTERVENTIONS: 02835- PT Re-evaluation, 97750- Physical Performance Testing, 97110-Therapeutic exercises, 97530- Therapeutic activity, [id number]- Neuromuscular re-education, 97535- Self Care, 02859- Manual therapy, [phone]- Gait training, [phone]- Orthotic Initial, [phone]- Orthotic/Prosthetic subsequent, [phone]- Aquatic Therapy, [phone]- Electrical stimulation (manual), [phone] (1-2 muscles), [phone] (3+ muscles)- Dry Needling, Patient/Family education, Balance training, Stair training, Taping, Joint mobilization, DME instructions, Cryotherapy, and Moist heat  PLAN FOR NEXT SESSION: check STGs, she should bring in her AFO tennis shoes (HOKAs), Trial L AFOs with R Thusane/Townsend posterior lateral AFO, initiate HEP to work on balance and functional strengthening: sit to stands, squats, glutes, stairs, has trouble standing up from lower surfaces - especially when tired, R hip strengthening  Reach out to referring provider for AFO order pending her needs Werner requested on 10/19/24) - she prefers it be sent to [facility] unless she can work with [facility]***   Waddell Southgate, PT Waddell Southgate, PT, DPT, CSRS  10/24/2024, 3:24 PM        "

## 2024-10-24 NOTE — Telephone Encounter (Signed)
 Can you send this?

## 2024-10-25 NOTE — Telephone Encounter (Signed)
 Did you want anything else done? Patient has been evaluated and plans to continue therapy.

## 2024-10-25 NOTE — Telephone Encounter (Signed)
 Referral was sent thru epic to  [facility]. Called  [facility]. Spoke to  [place]  she states that Pt has already been seen and evaluated October 03, 2024

## 2024-10-25 NOTE — Telephone Encounter (Signed)
 Neuro rehab PT was requesting an order for AFO brace. Was the order for the AFO sent to neuro rehab?

## 2024-10-25 NOTE — Telephone Encounter (Signed)
 SABRA

## 2024-10-26 ENCOUNTER — Ambulatory Visit: Admitting: Physical Therapy

## 2024-10-26 ENCOUNTER — Ambulatory Visit: Admitting: Family Medicine

## 2024-10-26 DIAGNOSIS — R278 Other lack of coordination: Secondary | ICD-10-CM

## 2024-10-26 DIAGNOSIS — R2681 Unsteadiness on feet: Secondary | ICD-10-CM

## 2024-10-26 DIAGNOSIS — G35A Relapsing-remitting multiple sclerosis: Secondary | ICD-10-CM

## 2024-10-26 DIAGNOSIS — M21371 Foot drop, right foot: Secondary | ICD-10-CM

## 2024-10-26 DIAGNOSIS — M6281 Muscle weakness (generalized): Secondary | ICD-10-CM

## 2024-10-26 DIAGNOSIS — R2689 Other abnormalities of gait and mobility: Secondary | ICD-10-CM

## 2024-10-26 NOTE — Therapy (Signed)
 " OUTPATIENT PHYSICAL THERAPY NEURO TREATMENT   Patient Name: Cynthia Nichols MRN: [id number] DOB:23-Apr-1961, 64 y.o., female Today's Date: 10/26/2024   PCP: Ina Marcellus RAMAN, MD REFERRING PROVIDER: Whitfield Raisin, NP  END OF SESSION:  PT End of Session - 10/26/24 9147     Visit Number 5    Number of Visits 13   with eval   Date for Recertification  11/28/24   to allow for scheduling delays   Authorization Type HTA    PT Start Time 0850    PT Stop Time 0930    PT Time Calculation (min) 40 min    Equipment Utilized During Treatment Gait belt    Activity Tolerance Patient tolerated treatment well    Behavior During Therapy [facility] for tasks assessed/performed              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   Essential hypertension, benign 11/29/2017   Fracture of fifth metatarsal bone 08/13/2015   Greater trochanteric bursitis, left 12/31/2019   Injection given December 31, 2019   Headache(784.0)    Humerus fracture    right with radial nerve palsy   Hypertension    IBS (irritable bowel syndrome)    Migraine without aura and without status migrainosus, not intractable 02/09/2018   MS (multiple sclerosis)    Multiple sclerosis, relapsing-remitting 12/14/2011   Osteoporosis 11/29/2017   Other fracture of shaft of right humerus, initial encounter for closed fracture 05/13/2017   Patella fracture 07/03/2015   Hairline oblique     Patellar subluxation, left, initial encounter 08/14/2018   PONV (postoperative nausea and vomiting)    only on this surgery   Radial nerve palsy, 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   SI (sacroiliac) joint  dysfunction 10/23/2015   Right-sided    Unspecified venous (peripheral) insufficiency 11/09/2011   Venous aneurysm    Right side of neck   Past Surgical History:  Procedure Laterality Date   ABDOMINAL HYSTERECTOMY     BREAST MASS EXCISION     Bilateral breast mass excision ( Benign)   ORIF HUMERUS FRACTURE Right 05/17/2017   RADIOLOGY WITH ANESTHESIA N/A 11/03/2021   Procedure: MRI WITH BRAIN WITH AND WITHOUT,CERVICAL WITH AND WITHOUT CONTRAST;  Surgeon: Radiologist, Medication, MD;  Location: [facility] OR;  Service: Radiology;  Laterality: N/A;   TENNIS ELBOW RELEASE/NIRSCHEL PROCEDURE Right 05/17/2017   Procedure: EXPLORATION AND EXPOSURE OF RADIAL NERVE RIGHT ARM;  Surgeon: Camella Fallow, MD;  Location: [facility] OR;  Service: Orthopedics;  Laterality: Right;   Patient Active Problem List   Diagnosis Date Noted   Sesamoiditis of right foot 02/07/2024   Capsulitis of metatarsophalangeal (MTP) joint of right foot 10/05/2023   De Quervain's tenosynovitis, left 08/23/2023   Moderate left ankle sprain 07/21/2023   Knee contusion 04/14/2022   Right foot drop 12/02/2021   Degenerative arthritis of right knee 11/27/2021   High risk medication use 02/17/2021   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   Urge incontinence 02/17/2021   Bilateral dry eyes 09/01/2020   Nuclear sclerosis of both eyes 09/01/2020   PONV (postoperative nausea and vomiting)  MS (multiple sclerosis)    Hypertension    Humerus fracture    Adopted    Degenerative arthritis of left knee 12/31/2019   Greater trochanteric bursitis, left 12/31/2019   Asthma 11/02/2019   Cough 11/02/2019   Carpal tunnel syndrome of left wrist 04/30/2019   Cubital tunnel syndrome 04/30/2019   Chronic fatigue 11/28/2018   Closed low lateral malleolus fracture, left, initial encounter 10/17/2018   Patellar subluxation, left, initial encounter 08/14/2018   Primary insomnia 05/16/2018   Migraine without aura and without  status migrainosus, not intractable 02/09/2018   Gait disturbance 01/11/2018   Intractable migraine with aura without status migrainosus 01/11/2018   Aortic atherosclerosis 11/29/2017   Medication monitoring encounter 11/29/2017   Osteoporosis 11/29/2017   Essential hypertension, benign 11/29/2017   Pulmonary nodules 10/06/2017   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   Radial nerve palsy, 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   Humerus shaft fracture 05/17/2017   Closed fracture of shaft of right humerus 05/13/2017   Other fracture of shaft of right humerus, initial encounter for closed fracture 05/13/2017   Contusion of elbow and forearm, initial encounter 08/26/2016   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   Nonallopathic lesion of sacral region 10/23/2015   Nonallopathic lesion of pelvic region 10/23/2015   Nonallopathic lesion of lumbosacral region 10/23/2015   Biomechanical lesion, unspecified 10/23/2015   Fracture of fifth metatarsal bone 08/13/2015   Patella fracture 07/03/2015   Paresthesia 06/19/2015   Bilateral leg pain 12/18/2014   Palpitations 06/23/2012   Mixed hyperlipidemia 06/23/2012   Venous aneurysm    IBS (irritable bowel syndrome)    Multiple sclerosis, relapsing-remitting (HCC) 12/14/2011   Environmental allergies 12/14/2011   Episodic tension-type headache 12/14/2011   Venous (peripheral) insufficiency 11/09/2011   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   Leg pain 10/26/2011    ONSET DATE: 09/05/2024 (referral date)  REFERRING DIAG: G35.A (ICD-10-CM) - Multiple sclerosis, relapsing-remitting R26.9 (ICD-10-CM) - Gait disturbance R29.898 (ICD-10-CM) - Weakness of both lower extremities  THERAPY DIAG:  Muscle weakness (generalized)  Other abnormalities of gait and mobility  Other lack of coordination  Unsteadiness on feet  Multiple sclerosis, relapsing-remitting (HCC)  Bilateral foot-drop  Rationale for Evaluation and Treatment: Rehabilitation  SUBJECTIVE:                                                                                                                                                                                              SUBJECTIVE STATEMENT:  Cynthia Nichols  Pt did her hip exercises this morning, they are still really difficult and she does tend to roll backwards. She also brought in her forearm  crutches today, asking about cushioning for her palms.  Pt accompanied by: self, drove herself  PERTINENT HISTORY: PMH: asthma, HTN, IBS, migraines, MS, osteoporosis  PAIN:  Are you having pain? Yes: NPRS scale: 0/10 at rest currently Pain location: L knee from fall, L hip pain comes and goes Pain description: toothache Aggravating factors: doing a lot, lots of movement Relieving factors: ibuprofen, massage  PRECAUTIONS: Fall  RED FLAGS: None   WEIGHT BEARING RESTRICTIONS: No  FALLS: Has patient fallen in last 6 months? Yes. Number of falls one recently - she tripped over her feet and injured her L knee, her husband had to help her back up  LIVING ENVIRONMENT: Lives with: lives with their spouse Lives in: House/apartment Stairs: Yes: External: 4-5 steps; on right going up one level once inside Has following equipment at home: Environmental Consultant - 2 wheeled, Environmental Consultant - 4 wheeled, and [employer] oil  PLOF: Independent with basic ADLs, Independent with household mobility with device, Independent with gait, Independent with transfers, and Requires assistive device for independence  PATIENT GOALS: I want to keep up what strength I got try not to wear out a pair of shoes  OBJECTIVE:  Note: Objective measures were completed at Evaluation unless otherwise noted.  DIAGNOSTIC FINDINGS: Nothing in chart related to this POC  COGNITION: Overall cognitive status: Within functional limits for tasks assessed   SENSATION: Gets burning sensation in feet Decreased light touch in distal RLE  COORDINATION: Impaired in BLE  EDEMA:  Swelling  comes and goes  POSTURE: rounded shoulders, forward head, and increased thoracic kyphosis  LOWER EXTREMITY ROM:   WFL  Active  Right Eval Left Eval  Hip flexion    Hip extension    Hip abduction    Hip adduction    Hip internal rotation    Hip external rotation    Knee flexion    Knee extension    Ankle dorsiflexion    Ankle plantarflexion    Ankle inversion    Ankle eversion     (Blank rows = not tested)  LOWER EXTREMITY MMT:    MMT Right Eval Left Eval  Hip flexion 3 4+  Hip extension    Hip abduction    Hip adduction    Hip internal rotation    Hip external rotation    Knee flexion 3- 5  Knee extension 3 5  Ankle dorsiflexion 3 4  Ankle plantarflexion    Ankle inversion    Ankle eversion    (Blank rows = not tested)  BED MOBILITY:  Little bit of trouble; uses exercise step to get in/out; bed is elevated  TRANSFERS: Sit to stand: Modified independence  Assistive device utilized: None     Stand to sit: Modified independence  Assistive device utilized: None     Chair to chair: Modified independence  Assistive device utilized: None       RAMP:  Not tested  CURB:  Not tested  STAIRS:  STAIRS:  Level of Assistance: SBA  Stair Negotiation Technique: Alternating Pattern  with Bilateral Rails  Number of Stairs: 4   Height of Stairs: 6  Comments: decreased hip and knee flexion, decreased B ankle DF   GAIT: Findings:  Gait pattern: decreased hip/knee flexion- Right, decreased hip/knee flexion- Left, decreased ankle dorsiflexion- Right, decreased ankle dorsiflexion- Left, scissoring, and narrow BOS Distance walked: various clinic distances Assistive device utilized: hurrycane Level of assistance: Modified independence Comments: R hip appears weaker than L hip, decreased R ankle DF as compared  to L side, mild path deviation   FUNCTIONAL TESTS:    [facility] PT Assessment - 10/26/24 0916       Ambulation/Gait   Gait velocity 32.8 ft over 14.72 sec =  2.23 ft/sec   with Lofstrand crutches     Functional Gait  Assessment   Gait assessed  Yes    Gait Level Surface Walks 20 ft, slow speed, abnormal gait pattern, evidence for imbalance or deviates 10-15 in outside of the 12 in walkway width. Requires more than 7 sec to ambulate 20 ft.    Change in Gait Speed Able to change speed, demonstrates mild gait deviations, deviates 6-10 in outside of the 12 in walkway width, or no gait deviations, unable to achieve a major change in velocity, or uses a change in velocity, or uses an assistive device.    Gait with Horizontal Head Turns Performs head turns with moderate changes in gait velocity, slows down, deviates 10-15 in outside 12 in walkway width but recovers, can continue to walk.    Gait with Vertical Head Turns Performs task with slight change in gait velocity (eg, minor disruption to smooth gait path), deviates 6 - 10 in outside 12 in walkway width or uses assistive device    Gait and Pivot Turn Pivot turns safely in greater than 3 sec and stops with no loss of balance, or pivot turns safely within 3 sec and stops with mild imbalance, requires small steps to catch balance.    Step Over Obstacle Is able to step over one shoe box (4.5 in total height) but must slow down and adjust steps to clear box safely. May require verbal cueing.    Gait with Narrow Base of Support Ambulates 4-7 steps.    Gait with Eyes Closed Cannot walk 20 ft without assistance, severe gait deviations or imbalance, deviates greater than 15 in outside 12 in walkway width or will not attempt task.    Ambulating Backwards Walks 20 ft, uses assistive device, slower speed, mild gait deviations, deviates 6-10 in outside 12 in walkway width.    Steps Alternating feet, must use rail.    Total Score 14    FGA comment: 14/30, high fall risk            PATIENT SURVEYS:  None assessed at eval                                                                                                                               TREATMENT DATE:   TherAct  Added cushioning to lofstrand crutch handles with blue tubing and coban. Provided extra materials for patient in case these wear out. She does report improved comfort with added cushioning.  To work on B hip strengthening with BUE support: Sidesteps at ballet bar 3 x 10 ft L/R Added in 2 and 4 hurdles with sidestepping 3 x 10 ft L/R More difficulty lifting RLE as compared to LLE  Physical Performance For STG assessment:  [facility] PT Assessment - 10/26/24 0916       Ambulation/Gait   Gait velocity 32.8 ft over 14.72 sec = 2.23 ft/sec   with Lofstrand crutches     Functional Gait  Assessment   Gait assessed  Yes    Gait Level Surface Walks 20 ft, slow speed, abnormal gait pattern, evidence for imbalance or deviates 10-15 in outside of the 12 in walkway width. Requires more than 7 sec to ambulate 20 ft.    Change in Gait Speed Able to change speed, demonstrates mild gait deviations, deviates 6-10 in outside of the 12 in walkway width, or no gait deviations, unable to achieve a major change in velocity, or uses a change in velocity, or uses an assistive device.    Gait with Horizontal Head Turns Performs head turns with moderate changes in gait velocity, slows down, deviates 10-15 in outside 12 in walkway width but recovers, can continue to walk.    Gait with Vertical Head Turns Performs task with slight change in gait velocity (eg, minor disruption to smooth gait path), deviates 6 - 10 in outside 12 in walkway width or uses assistive device    Gait and Pivot Turn Pivot turns safely in greater than 3 sec and stops with no loss of balance, or pivot turns safely within 3 sec and stops with mild imbalance, requires small steps to catch balance.    Step Over Obstacle Is able to step over one shoe box (4.5 in total height) but must slow down and adjust steps to clear box safely. May require verbal cueing.    Gait with Narrow Base of Support  Ambulates 4-7 steps.    Gait with Eyes Closed Cannot walk 20 ft without assistance, severe gait deviations or imbalance, deviates greater than 15 in outside 12 in walkway width or will not attempt task.    Ambulating Backwards Walks 20 ft, uses assistive device, slower speed, mild gait deviations, deviates 6-10 in outside 12 in walkway width.    Steps Alternating feet, must use rail.    Total Score 14    FGA comment: 14/30, high fall risk            PATIENT EDUCATION: Education details: continue HEP, continue to look into tennis shoes, results of OM and functional implications, continue to try out lofstrand crutches Person educated: Patient Education method: Explanation, Demonstration, Tactile cues, and Verbal cues Education comprehension: verbalized understanding, returned demonstration, verbal cues required, and needs further education  HOME EXERCISE PROGRAM: Access Code: [email or web address] URL: [email or web address] Date: 10/24/2024 Prepared by: Waddell Southgate  Exercises - Clamshell  - 1 x daily - 7 x weekly - 3 sets - 10 reps - Supine Single Bent Knee Fallout  - 1 x daily - 7 x weekly - 3 sets - 10 reps - Sidelying Diagonal Hip Abduction  - 1 x daily - 7 x weekly - 3 sets - 10 reps  GOALS: Goals reviewed with patient? Yes  SHORT TERM GOALS: Target date: 10/26/2024   Pt will be independent with initial HEP for improved strength, balance, transfers and gait. Baseline: Goal status: MET  2.  Pt to trial a L AFO to determine if she would benefit from bracing on this limb with gait. Baseline: Trialed on 1/30 Goal status: MET  3.  Pt will improve gait velocity to at least 2.0 ft/sec for improved gait efficiency and performance at mod I level  Baseline: 1.76 ft/sec with hurrycane mod  I (1/14), 2.23 ft/sec with Lofstrand crutches (2/6) Goal status: MET  4.  Pt will improve FGA to 16/30 for decreased fall risk  Baseline: 12/30 (1/14), 14/30 (2/6) Goal status: IN  PROGRESS    LONG TERM GOALS: Target date: 11/16/2024   Pt will be independent with final HEP for improved strength, balance, transfers and gait. Baseline:  Goal status: INITIAL  2.  Pt to obtain L AFO if determined it is needed for improved gait mechanics and decreased fall risk. Baseline:  Goal status: INITIAL  3.  Pt will improve gait velocity to at least 2.25 ft/sec for improved gait efficiency and performance at mod I level  Baseline: 1.76 ft/sec with hurrycane mod I (1/14), 2.23 ft/sec with Lofstrand crutches (2/6) Goal status: INITIAL  4.  Pt will improve FGA to 20/30 for decreased fall risk  Baseline: 12/30 (1/14), 14/30 (2/6) Goal status: INITIAL  5.  Pt will improve normal TUG to less than or equal to 13 seconds for improved functional mobility and decreased fall risk. Baseline: 14.25 sec with hurrycane mod I (1/14) Goal status: INITIAL   ASSESSMENT:  CLINICAL IMPRESSION: Emphasis of skilled PT session on assessing STG, continuing to work on B hip strengthening, and working on adding cushioning to her Lofstrand crutches for improved comfort. Pt has met 3/4 STG due to being independent with her initial HEP, improving her gait speed from 1.76 ft/sec initially to 2.23 ft/sec and trialing a L AFO to determine if she could benefit from getting a new AFO for this LE as well. She did improve her FGA score from 12/30 to 14/30 though she did not improve enough to meet her STG. Overall she has shown improvement in function and decreased fall risk. She also continues to exhibit significant hip abd weakness (R>L) and benefits from strengthening in order to improve her gait mechanics. Continue POC.    OBJECTIVE IMPAIRMENTS: Abnormal gait, decreased balance, decreased coordination, difficulty walking, decreased strength, impaired perceived functional ability, improper body mechanics, postural dysfunction, and pain.   ACTIVITY LIMITATIONS: carrying, lifting, bending, standing,  squatting, stairs, and transfers  PARTICIPATION LIMITATIONS: community activity  PERSONAL FACTORS: Time since onset of injury/illness/exacerbation and 3+ comorbidities:   asthma, HTN, IBS, migraines, MS, osteoporosisare also affecting patient's functional outcome.   REHAB POTENTIAL: Good  CLINICAL DECISION MAKING: Evolving/moderate complexity  EVALUATION COMPLEXITY: Moderate  PLAN:  PT FREQUENCY: 2x/week  PT DURATION: 6 weeks  PLANNED INTERVENTIONS: 02835- PT Re-evaluation, 97750- Physical Performance Testing, 97110-Therapeutic exercises, 97530- Therapeutic activity, [id number]- Neuromuscular re-education, 97535- Self Care, [id number]- Manual therapy, [id number]- Gait training, [phone]- Orthotic Initial, [phone]- Orthotic/Prosthetic subsequent, [phone]- Aquatic Therapy, [phone]- Electrical stimulation (manual), [phone] (1-2 muscles), [phone] (3+ muscles)- Dry Needling, Patient/Family education, Balance training, Stair training, Taping, Joint mobilization, DME instructions, Cryotherapy, and Moist heat  PLAN FOR NEXT SESSION: she should bring in her AFO tennis shoes (HOKAs), Trial L AFOs with R Thusane/Townsend posterior lateral AFO, initiate HEP to work on balance and functional strengthening: sit to stands, squats, glutes, stairs, has trouble standing up from lower surfaces - especially when tired, R hip strengthening, are Lofstrands more comfortable?  Reach out to referring provider for AFO order pending her needs Werner requested on 10/19/24, Waddell faxed to [facility] 10/25/24) - she prefers it be sent to [facility] unless she can work with Olivia   Cresta Riden, PT Waddell Southgate, PT, DPT, CSRS  10/26/2024, 9:32 AM        "

## 2024-10-31 ENCOUNTER — Ambulatory Visit: Admitting: Physical Therapy

## 2024-11-02 ENCOUNTER — Ambulatory Visit: Admitting: Physical Therapy

## 2024-11-07 ENCOUNTER — Ambulatory Visit: Admitting: Physical Therapy

## 2024-11-09 ENCOUNTER — Ambulatory Visit: Admitting: Physical Therapy

## 2024-11-14 ENCOUNTER — Ambulatory Visit: Admitting: Physical Therapy

## 2024-11-16 ENCOUNTER — Ambulatory Visit: Admitting: Physical Therapy

## 2024-11-21 ENCOUNTER — Ambulatory Visit: Admitting: Physical Therapy

## 2024-11-23 ENCOUNTER — Ambulatory Visit: Admitting: Physical Therapy

## 2024-12-24 ENCOUNTER — Ambulatory Visit: Admitting: Family Medicine

## 2025-04-30 ENCOUNTER — Ambulatory Visit: Admitting: Neurology
